# Patient Record
Sex: Female | Born: 1975 | Race: White | Hispanic: No | Marital: Married | Smoking: Former smoker
Health system: Southern US, Community
[De-identification: ages and names within clinical notes are randomized; demographics above are authoritative.]

## PROBLEM LIST (undated history)

## (undated) DIAGNOSIS — E039 Hypothyroidism, unspecified: Secondary | ICD-10-CM

## (undated) DIAGNOSIS — E079 Disorder of thyroid, unspecified: Secondary | ICD-10-CM

## (undated) DIAGNOSIS — Z8742 Personal history of other diseases of the female genital tract: Secondary | ICD-10-CM

## (undated) DIAGNOSIS — Q513 Bicornate uterus: Secondary | ICD-10-CM

## (undated) DIAGNOSIS — M1990 Unspecified osteoarthritis, unspecified site: Secondary | ICD-10-CM

## (undated) DIAGNOSIS — I011 Acute rheumatic endocarditis: Secondary | ICD-10-CM

## (undated) HISTORY — PX: MYRINGOTOMY WITH TUBE PLACEMENT: SHX5663

## (undated) HISTORY — DX: Personal history of other diseases of the female genital tract: Z87.42

## (undated) HISTORY — DX: Acute rheumatic endocarditis: I01.1

## (undated) HISTORY — DX: Disorder of thyroid, unspecified: E07.9

## (undated) HISTORY — DX: Bicornate uterus: Q51.3

---
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
## 2005-06-02 DIAGNOSIS — E039 Hypothyroidism, unspecified: Secondary | ICD-10-CM | POA: Insufficient documentation

## 2008-08-23 DIAGNOSIS — IMO0002 Reserved for concepts with insufficient information to code with codable children: Secondary | ICD-10-CM | POA: Insufficient documentation

## 2012-12-23 HISTORY — PX: DILATION AND CURETTAGE, DIAGNOSTIC / THERAPEUTIC: SUR384

## 2012-12-23 HISTORY — PX: TUBAL LIGATION: SHX77

## 2018-03-24 DIAGNOSIS — E039 Hypothyroidism, unspecified: Secondary | ICD-10-CM | POA: Diagnosis not present

## 2019-01-11 DIAGNOSIS — R202 Paresthesia of skin: Secondary | ICD-10-CM | POA: Diagnosis not present

## 2019-01-11 DIAGNOSIS — R635 Abnormal weight gain: Secondary | ICD-10-CM | POA: Diagnosis not present

## 2019-01-11 DIAGNOSIS — Z1329 Encounter for screening for other suspected endocrine disorder: Secondary | ICD-10-CM | POA: Diagnosis not present

## 2019-01-11 DIAGNOSIS — E039 Hypothyroidism, unspecified: Secondary | ICD-10-CM | POA: Diagnosis not present

## 2019-01-14 DIAGNOSIS — E781 Pure hyperglyceridemia: Secondary | ICD-10-CM | POA: Insufficient documentation

## 2019-03-28 DIAGNOSIS — E039 Hypothyroidism, unspecified: Secondary | ICD-10-CM | POA: Diagnosis not present

## 2019-03-28 DIAGNOSIS — E781 Pure hyperglyceridemia: Secondary | ICD-10-CM | POA: Diagnosis not present

## 2019-11-23 DIAGNOSIS — N926 Irregular menstruation, unspecified: Secondary | ICD-10-CM | POA: Diagnosis not present

## 2019-11-23 DIAGNOSIS — Z130 Encounter for screening for diseases of the blood and blood-forming organs and certain disorders involving the immune mechanism: Secondary | ICD-10-CM | POA: Diagnosis not present

## 2019-11-23 DIAGNOSIS — R2232 Localized swelling, mass and lump, left upper limb: Secondary | ICD-10-CM | POA: Diagnosis not present

## 2019-11-23 DIAGNOSIS — Z13220 Encounter for screening for lipoid disorders: Secondary | ICD-10-CM | POA: Diagnosis not present

## 2019-11-23 DIAGNOSIS — Z0000 Encounter for general adult medical examination without abnormal findings: Secondary | ICD-10-CM | POA: Diagnosis not present

## 2019-11-23 DIAGNOSIS — Z131 Encounter for screening for diabetes mellitus: Secondary | ICD-10-CM | POA: Diagnosis not present

## 2019-11-23 DIAGNOSIS — Z6836 Body mass index (BMI) 36.0-36.9, adult: Secondary | ICD-10-CM | POA: Diagnosis not present

## 2019-11-23 DIAGNOSIS — N920 Excessive and frequent menstruation with regular cycle: Secondary | ICD-10-CM | POA: Diagnosis not present

## 2019-11-23 DIAGNOSIS — Z01419 Encounter for gynecological examination (general) (routine) without abnormal findings: Secondary | ICD-10-CM | POA: Diagnosis not present

## 2019-11-23 DIAGNOSIS — Z3200 Encounter for pregnancy test, result unknown: Secondary | ICD-10-CM | POA: Diagnosis not present

## 2019-11-23 DIAGNOSIS — Z1151 Encounter for screening for human papillomavirus (HPV): Secondary | ICD-10-CM | POA: Diagnosis not present

## 2019-11-23 DIAGNOSIS — E039 Hypothyroidism, unspecified: Secondary | ICD-10-CM | POA: Diagnosis not present

## 2019-11-30 ENCOUNTER — Other Ambulatory Visit: Payer: Self-pay | Admitting: Obstetrics

## 2019-11-30 DIAGNOSIS — R2230 Localized swelling, mass and lump, unspecified upper limb: Secondary | ICD-10-CM

## 2019-12-04 DIAGNOSIS — R2242 Localized swelling, mass and lump, left lower limb: Secondary | ICD-10-CM | POA: Diagnosis not present

## 2019-12-04 DIAGNOSIS — Z6833 Body mass index (BMI) 33.0-33.9, adult: Secondary | ICD-10-CM | POA: Diagnosis not present

## 2019-12-18 ENCOUNTER — Ambulatory Visit
Admission: RE | Admit: 2019-12-18 | Discharge: 2019-12-18 | Disposition: A | Discharge status: Home / Self Care | Payer: BC Managed Care – PPO | Source: Ambulatory Visit | Attending: Obstetrics | Admitting: Obstetrics

## 2019-12-18 ENCOUNTER — Other Ambulatory Visit: Payer: Self-pay

## 2019-12-18 DIAGNOSIS — R928 Other abnormal and inconclusive findings on diagnostic imaging of breast: Secondary | ICD-10-CM | POA: Diagnosis not present

## 2019-12-18 DIAGNOSIS — N6001 Solitary cyst of right breast: Secondary | ICD-10-CM | POA: Diagnosis not present

## 2019-12-18 DIAGNOSIS — R2230 Localized swelling, mass and lump, unspecified upper limb: Secondary | ICD-10-CM

## 2019-12-18 IMAGING — MG DIGITAL DIAGNOSTIC BILAT W/ TOMO W/ CAD
6 of 10 series · 6 of 30 positions shown · non-contrast
Comparison: None.

ACR Breast Density Category a: The breast tissue is almost entirely
fatty.

CLINICAL DATA: Baseline mammogram. Palpable lump in the left
axilla.

EXAM:
DIGITAL DIAGNOSTIC BILATERAL MAMMOGRAM WITH CAD AND TOMO
ULTRASOUND LEFT BREAST

[R CC synth-2D]
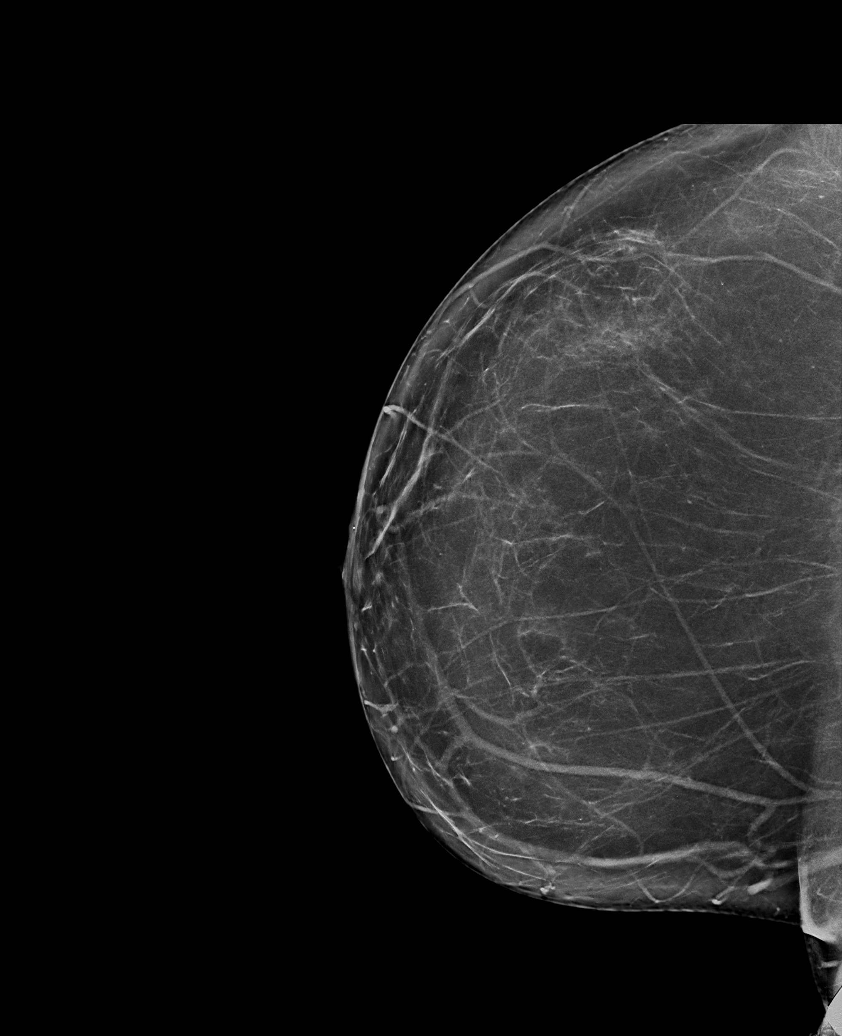

[L CC synth-2D]
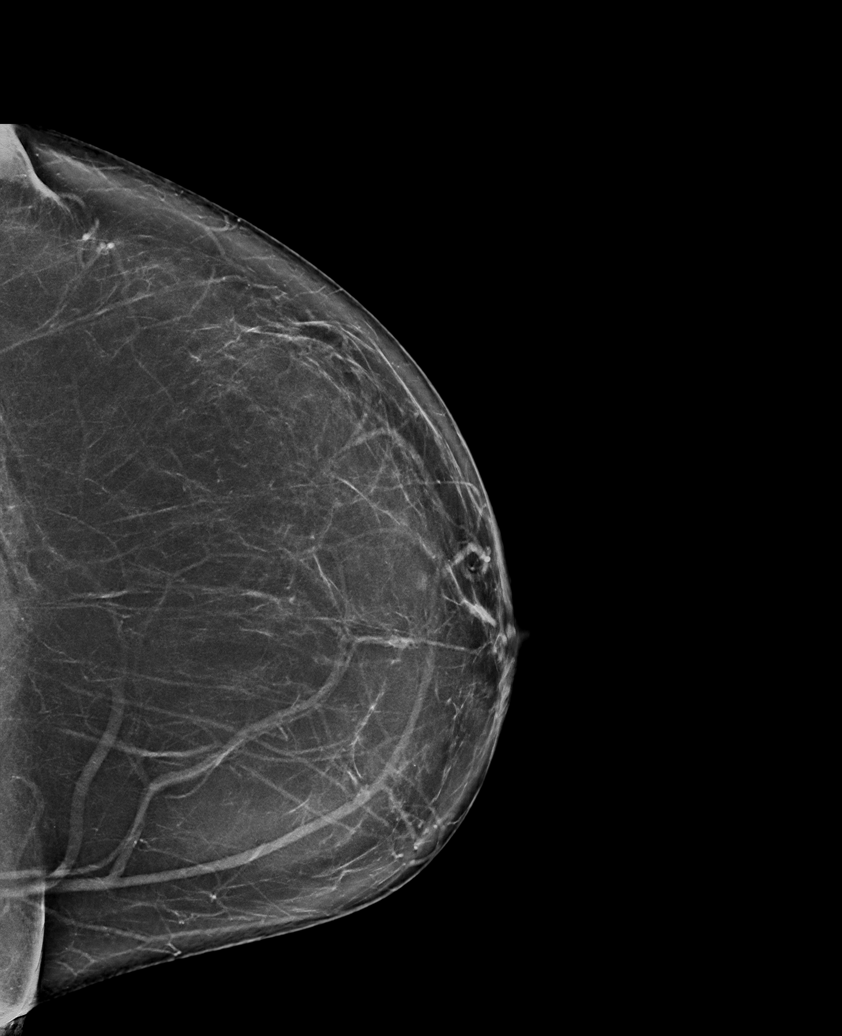

[R MLO synth-2D]
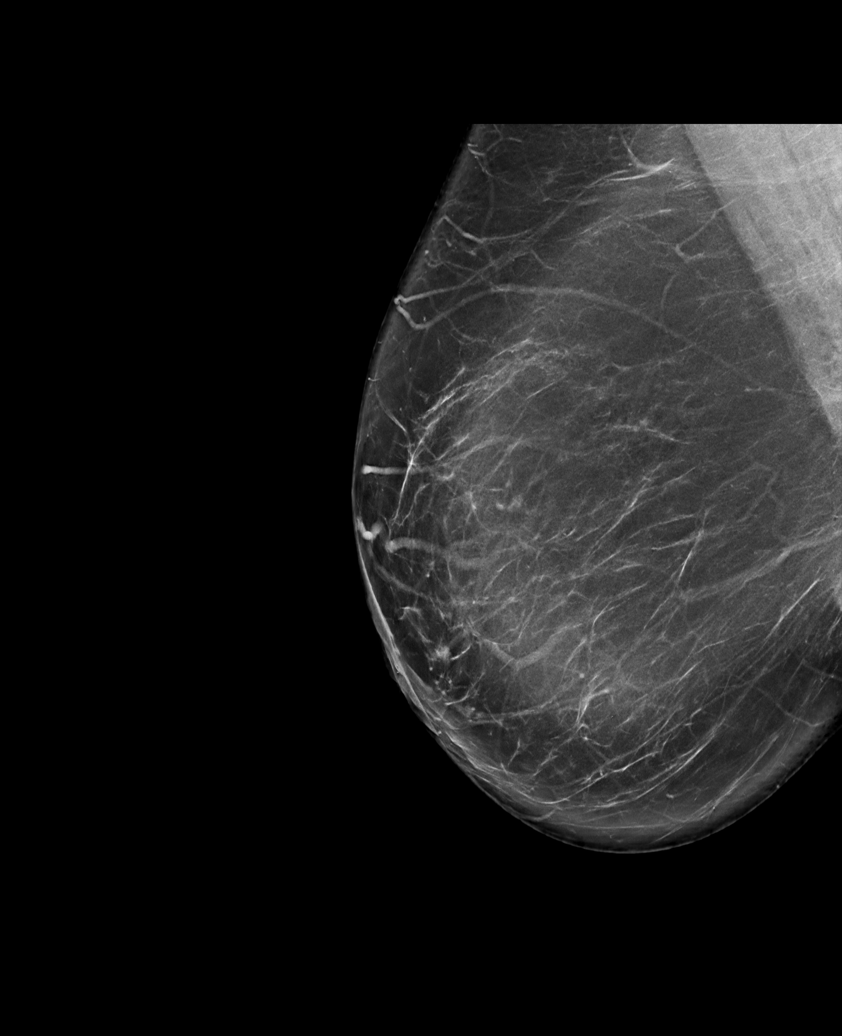

[L MLO synth-2D (1 of 2)]
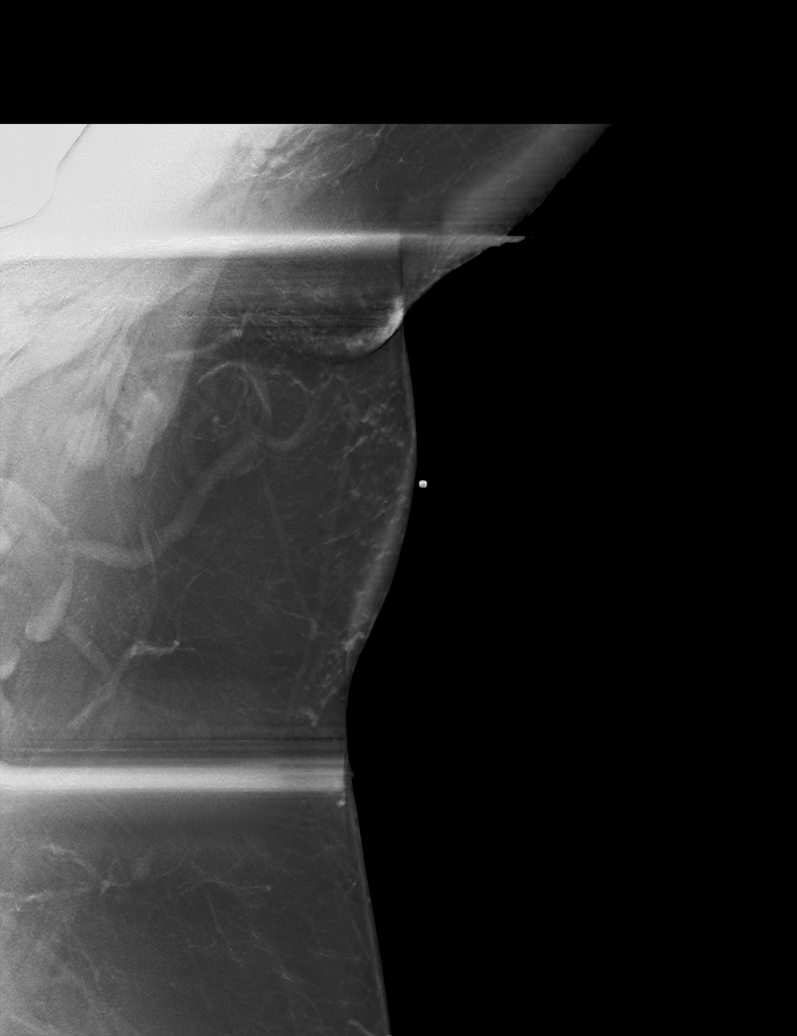

[L MLO synth-2D (2 of 2)]
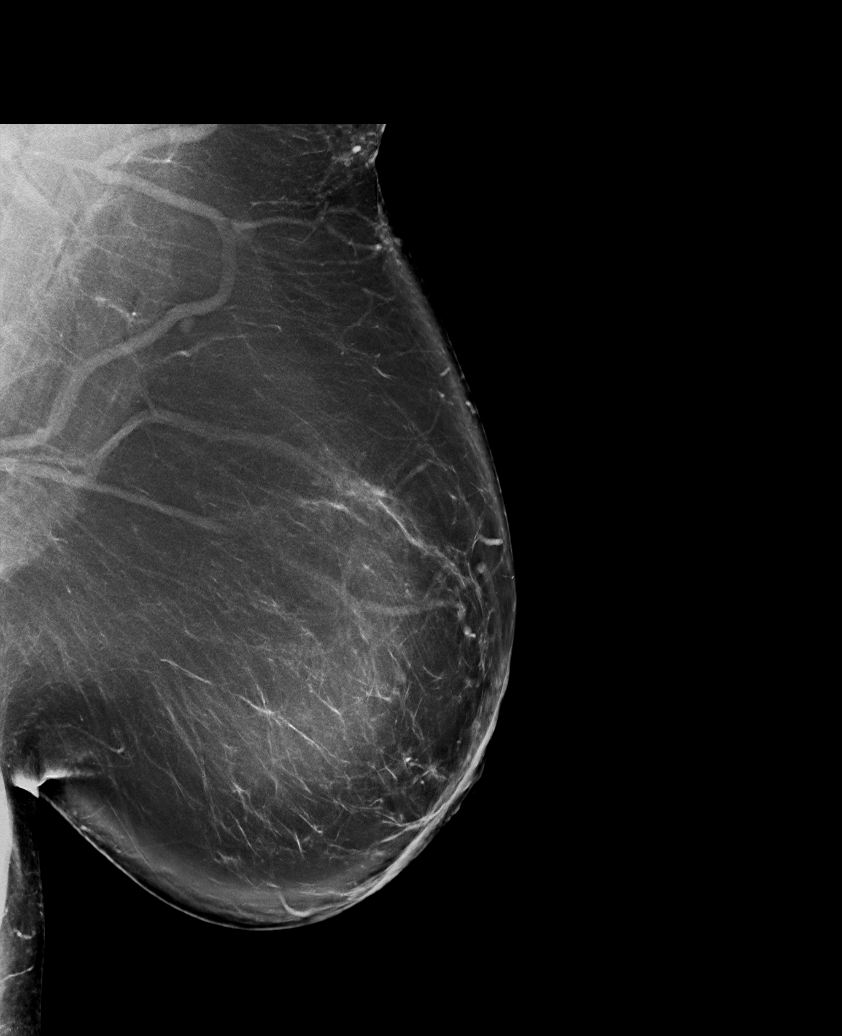

[R MLO tomo · tomo slice 50/99.0]
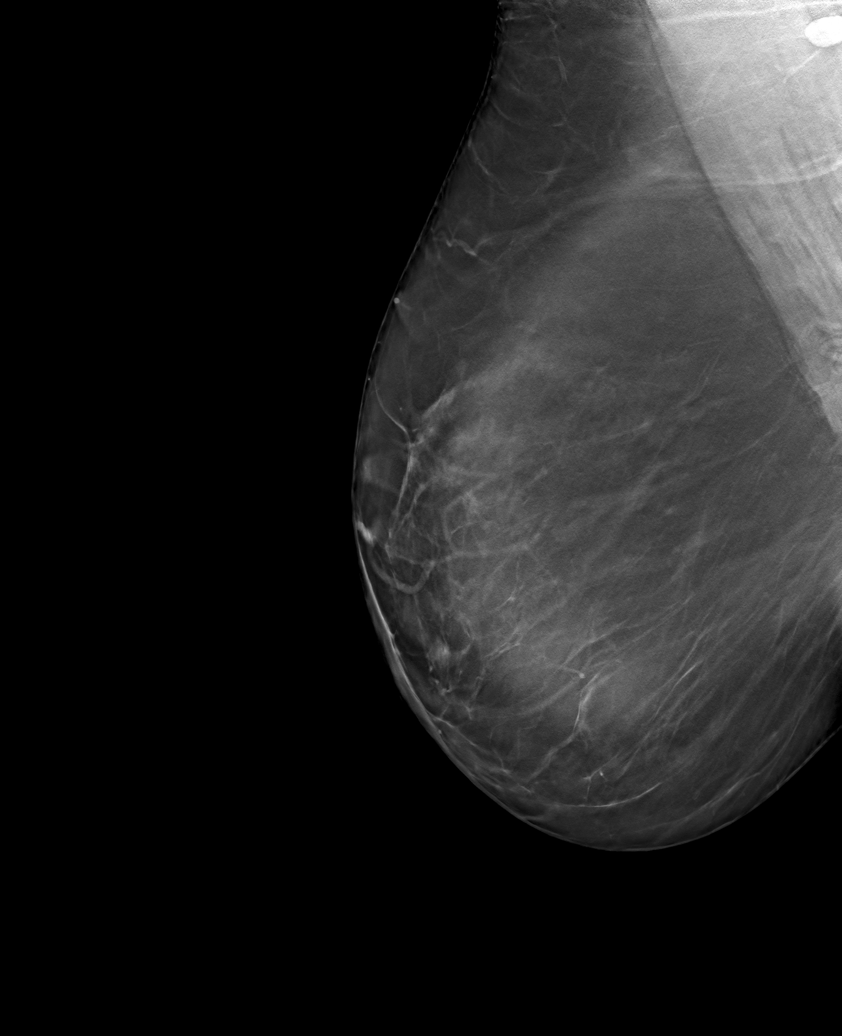

[6 of 30 positions shown; findings below may reference images not displayed]

FINDINGS: There is a small mass in the anterior left breast measuring 3 or 4
mm. No other suspicious mammographic findings are identified. Normal
tissue including mostly fat but also a small amount of glandular
tissue is seen in the region of the patient's lump in the left
axilla.

Mammographic images were processed with CAD.

Targeted ultrasound is performed, showing no abnormalities in the
region of the patient's left axillary lump. A small cyst is seen in
the left breast at 1 o'clock, 2 cm from the nipple measuring up to
3.9 mm, correlating with the mammographic finding.
IMPRESSION: No mammographic or sonographic evidence of malignancy.

RECOMMENDATION:
Annual screening mammography.

I have discussed the findings and recommendations with the patient.
If applicable, a reminder letter will be sent to the patient
regarding the next appointment.

BI-RADS CATEGORY  2: Benign.

## 2019-12-18 IMAGING — US US BREAST*L* LIMITED INC AXILLA
1 series · 9 of 9 positions shown · non-contrast
Comparison: None.

ACR Breast Density Category a: The breast tissue is almost entirely
fatty.

CLINICAL DATA: Baseline mammogram. Palpable lump in the left
axilla.

EXAM:
DIGITAL DIAGNOSTIC BILATERAL MAMMOGRAM WITH CAD AND TOMO
ULTRASOUND LEFT BREAST

[Series 1: us breast*left* limited inc axilla · 0.07mm/px · 9 of 9 slices shown]
[im 1/9]
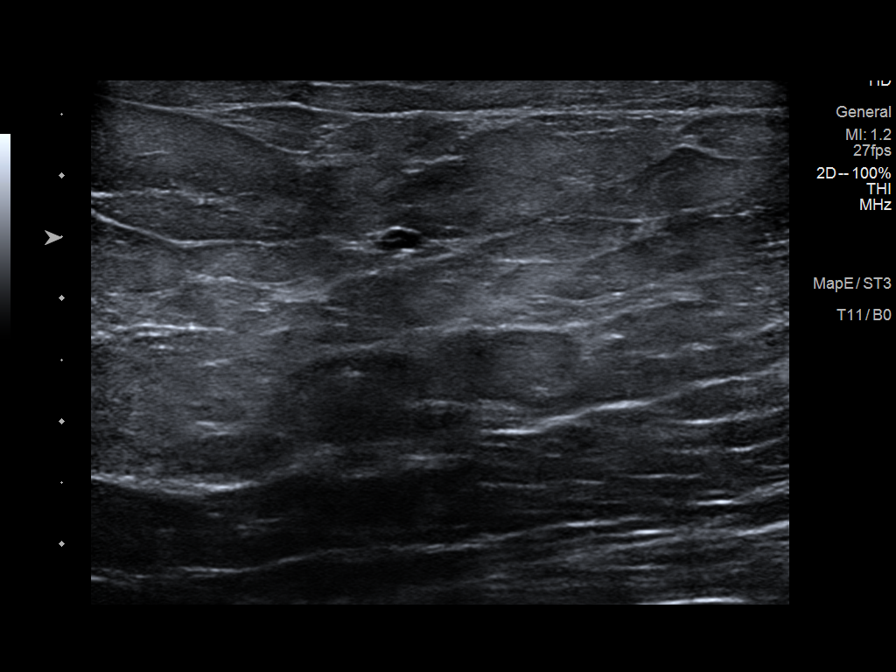
[im 2/9]
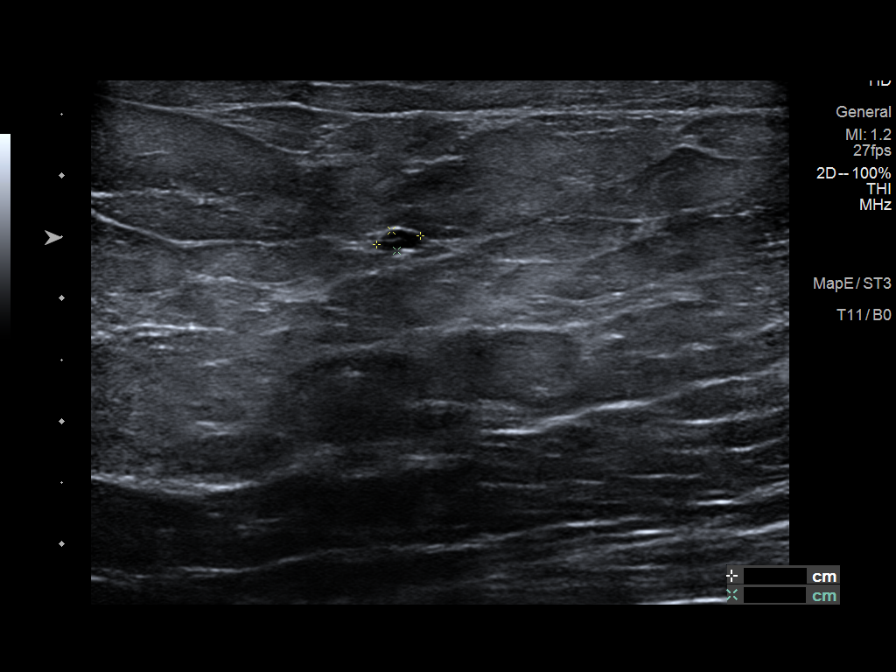
[im 3/9]
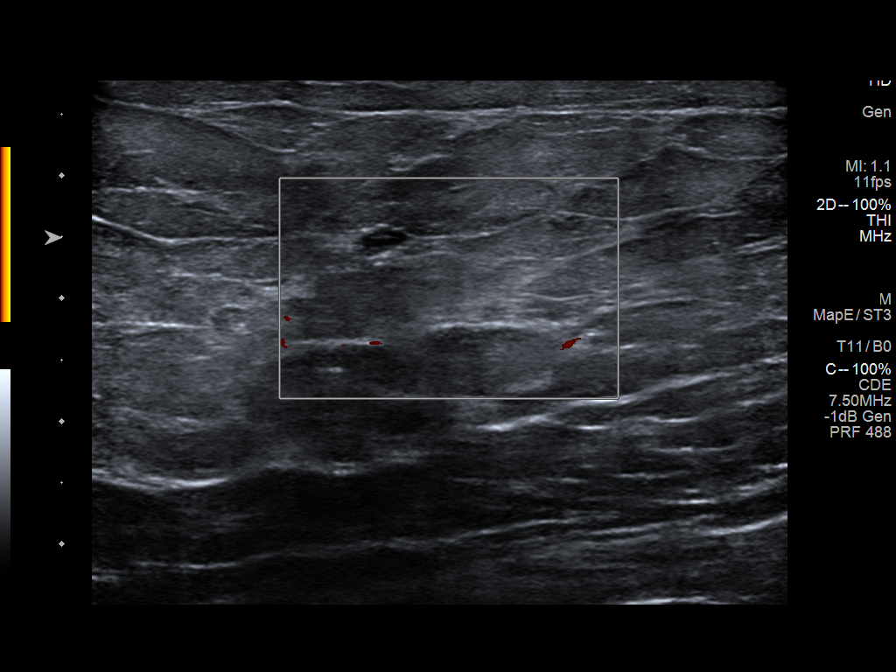
[im 4/9]
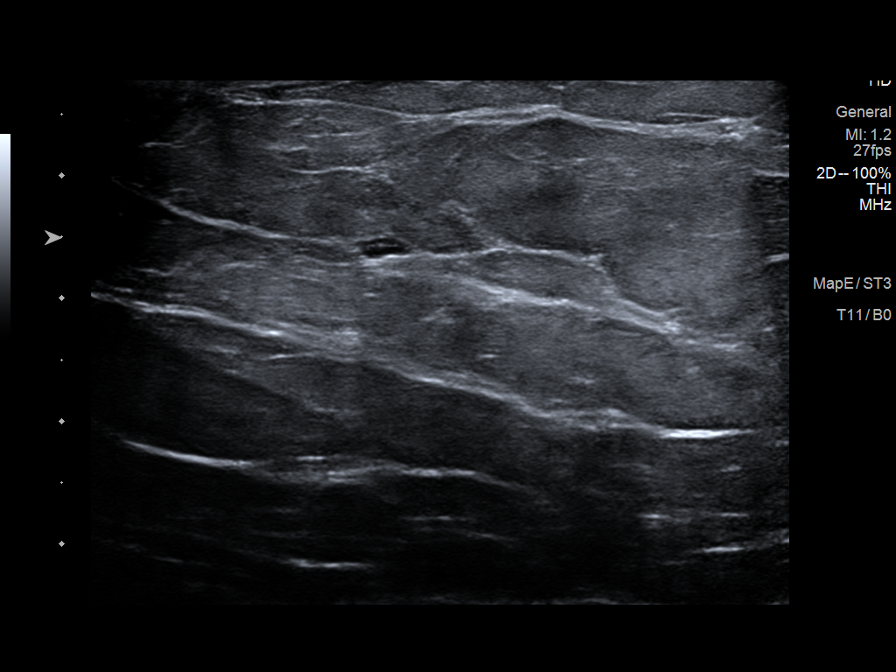
[im 5/9]
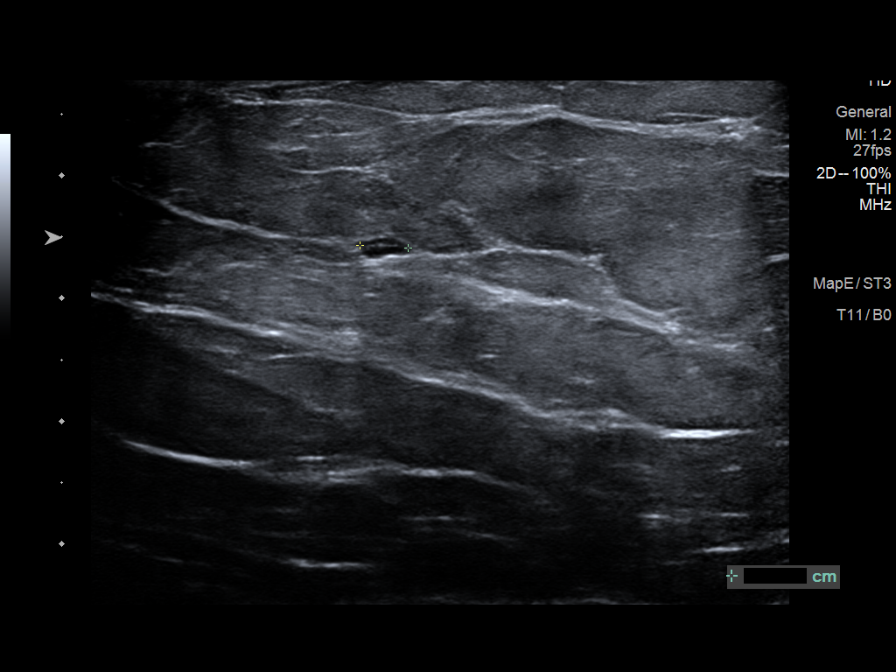
[im 6/9]
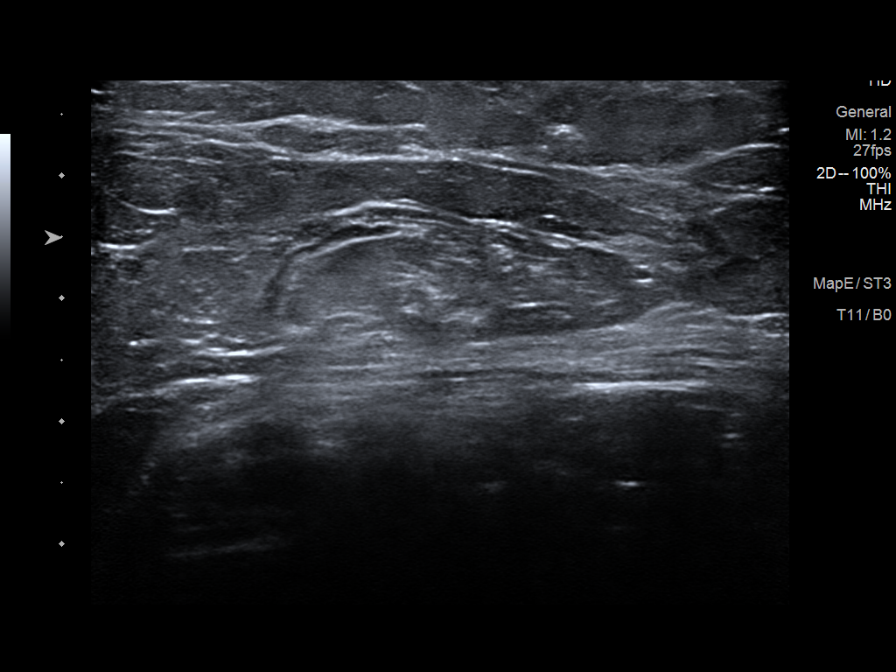
[im 7/9]
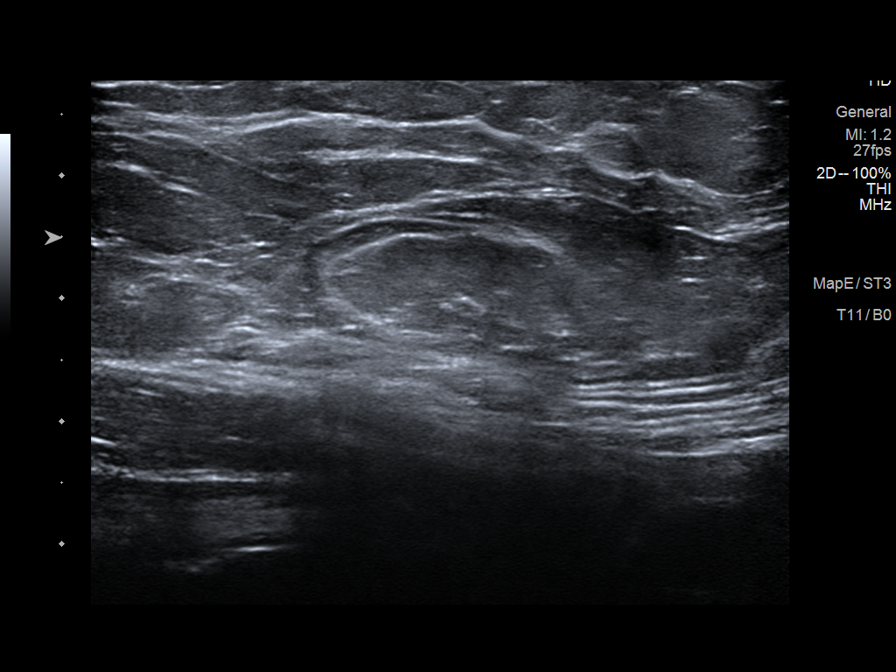
[im 8/9]
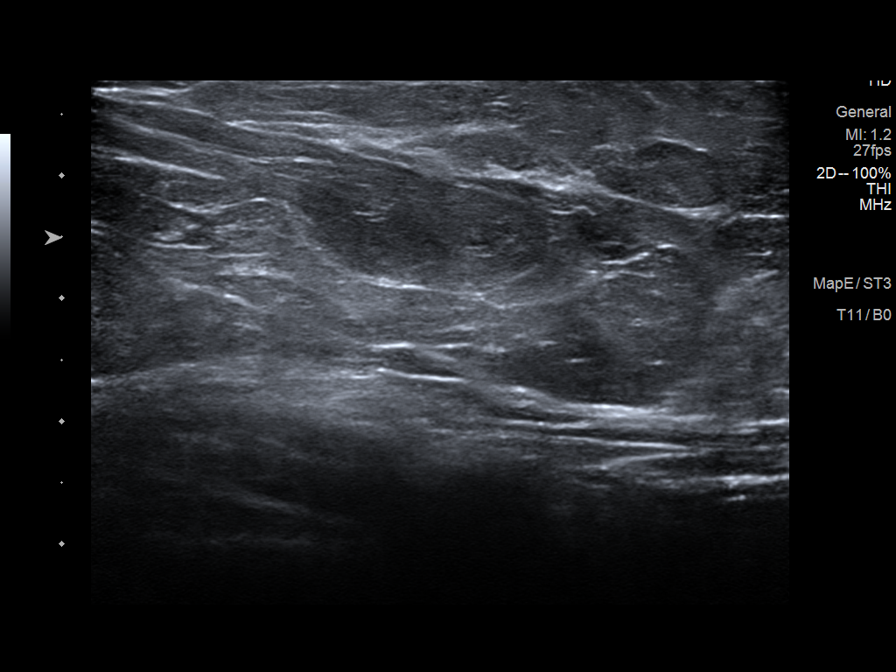
[im 9/9]
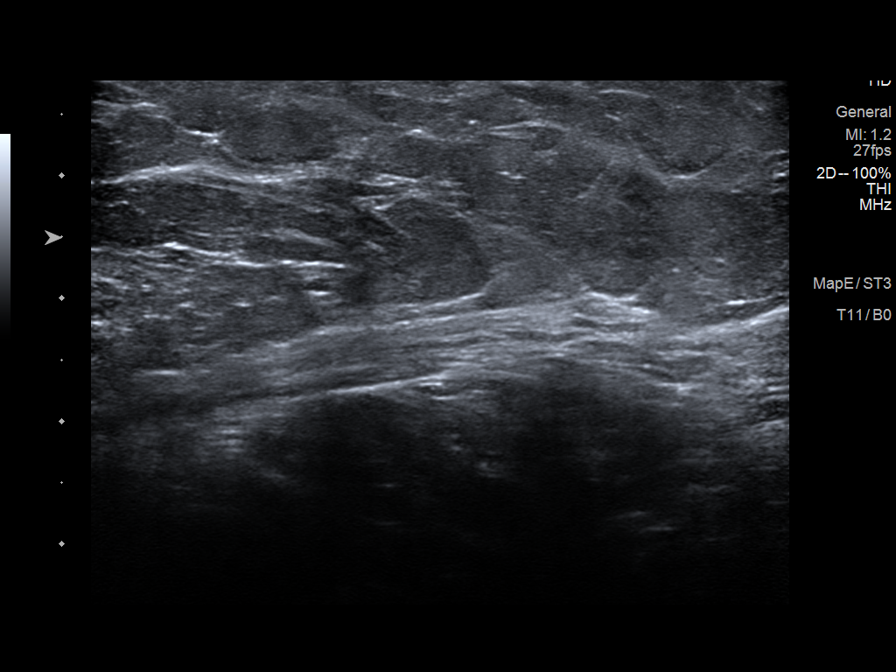

[9 of 9 positions shown; findings below may reference images not displayed]

FINDINGS: There is a small mass in the anterior left breast measuring 3 or 4
mm. No other suspicious mammographic findings are identified. Normal
tissue including mostly fat but also a small amount of glandular
tissue is seen in the region of the patient's lump in the left
axilla.

Mammographic images were processed with CAD.

Targeted ultrasound is performed, showing no abnormalities in the
region of the patient's left axillary lump. A small cyst is seen in
the left breast at 1 o'clock, 2 cm from the nipple measuring up to
3.9 mm, correlating with the mammographic finding.
IMPRESSION: No mammographic or sonographic evidence of malignancy.

RECOMMENDATION:
Annual screening mammography.

I have discussed the findings and recommendations with the patient.
If applicable, a reminder letter will be sent to the patient
regarding the next appointment.

BI-RADS CATEGORY  2: Benign.

## 2019-12-26 DIAGNOSIS — N926 Irregular menstruation, unspecified: Secondary | ICD-10-CM | POA: Diagnosis not present

## 2019-12-26 DIAGNOSIS — N8300 Follicular cyst of ovary, unspecified side: Secondary | ICD-10-CM | POA: Diagnosis not present

## 2020-05-13 DIAGNOSIS — R5383 Other fatigue: Secondary | ICD-10-CM | POA: Diagnosis not present

## 2020-05-13 DIAGNOSIS — E559 Vitamin D deficiency, unspecified: Secondary | ICD-10-CM | POA: Diagnosis not present

## 2020-05-13 DIAGNOSIS — E039 Hypothyroidism, unspecified: Secondary | ICD-10-CM | POA: Diagnosis not present

## 2020-05-13 DIAGNOSIS — R5381 Other malaise: Secondary | ICD-10-CM | POA: Diagnosis not present

## 2020-05-13 DIAGNOSIS — R197 Diarrhea, unspecified: Secondary | ICD-10-CM | POA: Diagnosis not present

## 2020-05-13 DIAGNOSIS — M130 Polyarthritis, unspecified: Secondary | ICD-10-CM | POA: Diagnosis not present

## 2020-05-20 DIAGNOSIS — E559 Vitamin D deficiency, unspecified: Secondary | ICD-10-CM | POA: Diagnosis not present

## 2020-05-20 DIAGNOSIS — D649 Anemia, unspecified: Secondary | ICD-10-CM | POA: Diagnosis not present

## 2020-05-20 DIAGNOSIS — R768 Other specified abnormal immunological findings in serum: Secondary | ICD-10-CM | POA: Diagnosis not present

## 2020-05-20 DIAGNOSIS — E039 Hypothyroidism, unspecified: Secondary | ICD-10-CM | POA: Diagnosis not present

## 2020-05-22 DIAGNOSIS — M545 Low back pain, unspecified: Secondary | ICD-10-CM | POA: Diagnosis not present

## 2020-05-22 DIAGNOSIS — M549 Dorsalgia, unspecified: Secondary | ICD-10-CM | POA: Diagnosis not present

## 2020-05-22 DIAGNOSIS — M533 Sacrococcygeal disorders, not elsewhere classified: Secondary | ICD-10-CM | POA: Diagnosis not present

## 2020-05-22 DIAGNOSIS — M1990 Unspecified osteoarthritis, unspecified site: Secondary | ICD-10-CM | POA: Diagnosis not present

## 2020-05-22 DIAGNOSIS — R700 Elevated erythrocyte sedimentation rate: Secondary | ICD-10-CM | POA: Diagnosis not present

## 2020-05-22 DIAGNOSIS — J9811 Atelectasis: Secondary | ICD-10-CM | POA: Diagnosis not present

## 2020-05-22 DIAGNOSIS — M79671 Pain in right foot: Secondary | ICD-10-CM | POA: Diagnosis not present

## 2020-05-22 DIAGNOSIS — M79641 Pain in right hand: Secondary | ICD-10-CM | POA: Diagnosis not present

## 2020-05-22 DIAGNOSIS — M79642 Pain in left hand: Secondary | ICD-10-CM | POA: Diagnosis not present

## 2020-05-22 DIAGNOSIS — M79672 Pain in left foot: Secondary | ICD-10-CM | POA: Diagnosis not present

## 2020-05-22 DIAGNOSIS — R21 Rash and other nonspecific skin eruption: Secondary | ICD-10-CM | POA: Diagnosis not present

## 2020-05-22 DIAGNOSIS — M47814 Spondylosis without myelopathy or radiculopathy, thoracic region: Secondary | ICD-10-CM | POA: Diagnosis not present

## 2020-05-22 DIAGNOSIS — R911 Solitary pulmonary nodule: Secondary | ICD-10-CM | POA: Diagnosis not present

## 2020-05-22 DIAGNOSIS — M2550 Pain in unspecified joint: Secondary | ICD-10-CM | POA: Diagnosis not present

## 2020-05-22 DIAGNOSIS — M47812 Spondylosis without myelopathy or radiculopathy, cervical region: Secondary | ICD-10-CM | POA: Diagnosis not present

## 2020-06-20 DIAGNOSIS — M0609 Rheumatoid arthritis without rheumatoid factor, multiple sites: Secondary | ICD-10-CM | POA: Diagnosis not present

## 2020-06-20 DIAGNOSIS — Z79899 Other long term (current) drug therapy: Secondary | ICD-10-CM | POA: Diagnosis not present

## 2020-06-20 DIAGNOSIS — M2550 Pain in unspecified joint: Secondary | ICD-10-CM | POA: Diagnosis not present

## 2020-06-20 DIAGNOSIS — M1990 Unspecified osteoarthritis, unspecified site: Secondary | ICD-10-CM | POA: Diagnosis not present

## 2020-07-11 DIAGNOSIS — L7211 Pilar cyst: Secondary | ICD-10-CM | POA: Diagnosis not present

## 2020-07-22 DIAGNOSIS — Z79899 Other long term (current) drug therapy: Secondary | ICD-10-CM | POA: Diagnosis not present

## 2020-07-22 DIAGNOSIS — M2550 Pain in unspecified joint: Secondary | ICD-10-CM | POA: Diagnosis not present

## 2020-07-22 DIAGNOSIS — M0609 Rheumatoid arthritis without rheumatoid factor, multiple sites: Secondary | ICD-10-CM | POA: Diagnosis not present

## 2020-07-22 DIAGNOSIS — M1990 Unspecified osteoarthritis, unspecified site: Secondary | ICD-10-CM | POA: Diagnosis not present

## 2020-09-17 ENCOUNTER — Encounter: Payer: Self-pay | Admitting: Plastic Surgery

## 2020-09-17 ENCOUNTER — Ambulatory Visit (INDEPENDENT_AMBULATORY_CARE_PROVIDER_SITE_OTHER): Payer: BLUE CROSS/BLUE SHIELD | Admitting: Plastic Surgery

## 2020-09-17 ENCOUNTER — Other Ambulatory Visit: Payer: Self-pay

## 2020-09-17 VITALS — BP 121/83 | HR 107 | Ht 63.0 in | Wt 188.2 lb

## 2020-09-17 DIAGNOSIS — L723 Sebaceous cyst: Secondary | ICD-10-CM

## 2020-09-17 NOTE — Progress Notes (Signed)
   Referring Provider Everardo Beals, NP [address] [place],  [place] [address]   CC:  Chief Complaint  Patient presents with   Advice Only      Brittney Herrera is an 45 y.o. female.  HPI: Patient presents to discuss a cyst on her scalp.  Is been present for 5 years.  Over the past 6 months it is grown significantly.  She also notes another one just adjacent to it.  She has had cyst in the past that have drained elsewhere on her body.  She is interested in removal of the 2 prominent scalp cysts that she has currently.  No Known Allergies  Outpatient Encounter Medications as of 09/17/2020  Medication Sig   folic acid (FOLVITE) 1 MG tablet Take 1 mg by mouth daily.   leflunomide (ARAVA) 20 MG tablet Take 20 mg by mouth daily.   levothyroxine (SYNTHROID) 125 MCG tablet Take 125 mcg by mouth daily.   No facility-administered encounter medications on file as of 09/17/2020.     No past medical history on file.  No family history on file.  Social History   Social History Narrative   Not on file     Review of Systems General: Denies fevers, chills, weight loss CV: Denies chest pain, shortness of breath, palpitations  Physical Exam Vitals with BMI 09/17/2020  Height '5\' 3"'$   Weight 188 lbs 3 oz  BMI 123XX123  Systolic 123XX123  Diastolic 83  Pulse [id number]    General:  No acute distress,  Alert and oriented, Non-Toxic, Normal speech and affect Examination of the scalp shows 2 adjacent cystic lesions just adjacent to each other.  One is probably around 3 cm in size and the adjacent wound is closer to 2 cm.  The hair is thinning over this area but there is no other obvious skin changes.  Assessment/Plan Patient presents with what I expect her to scalp cyst.  They are changing in size and shape and I believe warrant excision.  We discussed the risks include bleeding, infection, damage to surrounding structures need for additional procedures.  All her questions were answered we  will plan to schedule this under local in the office.  Cindra Presume 09/17/2020, 10:08 AM

## 2020-10-23 DIAGNOSIS — M1990 Unspecified osteoarthritis, unspecified site: Secondary | ICD-10-CM | POA: Diagnosis not present

## 2020-10-23 DIAGNOSIS — M0609 Rheumatoid arthritis without rheumatoid factor, multiple sites: Secondary | ICD-10-CM | POA: Diagnosis not present

## 2020-10-23 DIAGNOSIS — Z79899 Other long term (current) drug therapy: Secondary | ICD-10-CM | POA: Diagnosis not present

## 2020-10-23 DIAGNOSIS — M2550 Pain in unspecified joint: Secondary | ICD-10-CM | POA: Diagnosis not present

## 2020-11-18 DIAGNOSIS — K649 Unspecified hemorrhoids: Secondary | ICD-10-CM

## 2020-11-18 DIAGNOSIS — E781 Pure hyperglyceridemia: Secondary | ICD-10-CM

## 2020-11-18 DIAGNOSIS — M0609 Rheumatoid arthritis without rheumatoid factor, multiple sites: Secondary | ICD-10-CM | POA: Diagnosis not present

## 2020-11-18 HISTORY — DX: Unspecified hemorrhoids: K64.9

## 2020-11-18 HISTORY — DX: Pure hyperglyceridemia: E78.1

## 2020-11-26 DIAGNOSIS — Z01419 Encounter for gynecological examination (general) (routine) without abnormal findings: Secondary | ICD-10-CM | POA: Diagnosis not present

## 2020-11-26 DIAGNOSIS — N926 Irregular menstruation, unspecified: Secondary | ICD-10-CM | POA: Diagnosis not present

## 2020-11-26 DIAGNOSIS — Z6833 Body mass index (BMI) 33.0-33.9, adult: Secondary | ICD-10-CM | POA: Diagnosis not present

## 2020-11-26 DIAGNOSIS — Z1231 Encounter for screening mammogram for malignant neoplasm of breast: Secondary | ICD-10-CM | POA: Diagnosis not present

## 2020-11-26 DIAGNOSIS — R300 Dysuria: Secondary | ICD-10-CM | POA: Diagnosis not present

## 2020-11-27 DIAGNOSIS — N83292 Other ovarian cyst, left side: Secondary | ICD-10-CM | POA: Diagnosis not present

## 2020-11-27 DIAGNOSIS — N921 Excessive and frequent menstruation with irregular cycle: Secondary | ICD-10-CM | POA: Diagnosis not present

## 2020-11-28 DIAGNOSIS — K429 Umbilical hernia without obstruction or gangrene: Secondary | ICD-10-CM

## 2020-11-28 HISTORY — DX: Umbilical hernia without obstruction or gangrene: K42.9

## 2020-12-02 ENCOUNTER — Telehealth: Payer: Self-pay

## 2020-12-02 MED ORDER — DIAZEPAM 5 MG PO TABS: 5.0000 mg | ORAL_TABLET | Freq: Two times a day (BID) | ORAL | 0 refills | Status: AC | PRN

## 2020-12-02 NOTE — Telephone Encounter (Signed)
Pt is aware.  

## 2020-12-02 NOTE — Telephone Encounter (Signed)
Please advise. Thanks.  

## 2020-12-02 NOTE — Telephone Encounter (Signed)
Patient called to say she is having anxiety about her procedure tomorrow.  Patient said she believes it is stemming from a dental appointment she had in the past.  She said that she was so anxious about the procedure, that the numbing medication didn't work on her.  Patient would like to know if we can give her something to relax her for the procedure.  Patient said her husband is planning to bring her and take her home so she won't have to drive.  Please call.  *Patient's preferred pharmacy is [facility] on [address] in [place].

## 2020-12-03 ENCOUNTER — Encounter: Payer: Self-pay | Admitting: Plastic Surgery

## 2020-12-03 ENCOUNTER — Other Ambulatory Visit (HOSPITAL_COMMUNITY)
Admission: RE | Admit: 2020-12-03 | Discharge: 2020-12-03 | Disposition: A | Discharge status: Home / Self Care | Payer: BLUE CROSS/BLUE SHIELD | Source: Ambulatory Visit | Attending: Plastic Surgery | Admitting: Plastic Surgery

## 2020-12-03 ENCOUNTER — Other Ambulatory Visit: Payer: Self-pay

## 2020-12-03 ENCOUNTER — Ambulatory Visit (INDEPENDENT_AMBULATORY_CARE_PROVIDER_SITE_OTHER): Payer: BLUE CROSS/BLUE SHIELD | Admitting: Plastic Surgery

## 2020-12-03 VITALS — BP 118/80 | HR 85 | Ht 63.0 in | Wt 192.2 lb

## 2020-12-03 DIAGNOSIS — L723 Sebaceous cyst: Secondary | ICD-10-CM | POA: Diagnosis not present

## 2020-12-03 DIAGNOSIS — L720 Epidermal cyst: Secondary | ICD-10-CM | POA: Diagnosis not present

## 2020-12-03 DIAGNOSIS — L089 Local infection of the skin and subcutaneous tissue, unspecified: Secondary | ICD-10-CM | POA: Diagnosis not present

## 2020-12-03 NOTE — Progress Notes (Signed)
Operative Note   DATE OF OPERATION: 12/03/2020  LOCATION:    SURGICAL DEPARTMENT: Plastic Surgery  PREOPERATIVE DIAGNOSES: Scalp cyst  POSTOPERATIVE DIAGNOSES:  same  PROCEDURE:  Excision of scalp cyst measuring 4 cm Complex closure measuring 4 cm  SURGEON: Talmadge Coventry, MD  ANESTHESIA:  Local  COMPLICATIONS: None.   INDICATIONS FOR PROCEDURE:  The patient, Brittney Herrera is a 45 y.o. female born on [birth year], is here for treatment of scalp cyst MRN: [id number]  CONSENT:  Informed consent was obtained directly from the patient. Risks, benefits and alternatives were fully discussed. Specific risks including but not limited to bleeding, infection, hematoma, seroma, scarring, pain, infection, wound healing problems, and need for further surgery were all discussed. The patient did have an ample opportunity to have questions answered to satisfaction.   DESCRIPTION OF PROCEDURE:  Local anesthesia was administered. The patient's operative site was prepped and draped in a sterile fashion. A time out was performed and all information was confirmed to be correct.  The lesion was excised with a 15 blade.  Hemostasis was obtained.  Circumferential undermining was performed and the skin was advanced and closed in layers with interrupted buried Monocryl sutures and 3-0 Monocryl for the skin.  The lesion excised measured 4 cm, and the total length of closure measured 4 cm.    The patient tolerated the procedure well.  There were no complications.

## 2020-12-04 LAB — SURGICAL PATHOLOGY

## 2020-12-05 ENCOUNTER — Telehealth: Payer: Self-pay | Admitting: *Deleted

## 2020-12-05 NOTE — Telephone Encounter (Signed)
Attempted to reach the patient to schedule a new patient appt with Dr Berline Lopes. Patient's voice mail full and unable to leave message

## 2020-12-08 NOTE — Telephone Encounter (Signed)
Spoke with the patient and scheduled a new patient appt for 11/7 at 9:45 am with Dr Berline Lopes. Patient given the address and phone number fore the clinic; along with the policy for mask and visitors

## 2020-12-15 HISTORY — PX: OTHER SURGICAL HISTORY: SHX169

## 2020-12-17 ENCOUNTER — Other Ambulatory Visit: Payer: Self-pay

## 2020-12-17 ENCOUNTER — Ambulatory Visit (INDEPENDENT_AMBULATORY_CARE_PROVIDER_SITE_OTHER): Payer: PRIVATE HEALTH INSURANCE | Admitting: Surgical

## 2020-12-17 DIAGNOSIS — L723 Sebaceous cyst: Secondary | ICD-10-CM

## 2020-12-17 NOTE — Progress Notes (Signed)
Patient is a 45 year old female here for follow-up after excision of scalp cyst with Dr. Claudia Desanctis on 12/03/2020.  She is doing well.  She is not having any issues.  She reports that she has some itching of the incision.  On exam the incision is intact and healing well.  There is no erythema or cellulitic changes.  Monocryl running suture is noted.  Suture was removed, patient tolerated this well.  We discussed the pathology report which was benign.  Recommend following up as needed.  Picture was taken and placed in the patient's chart with patient's permission.  Call with questions or concerns.

## 2020-12-26 ENCOUNTER — Encounter: Payer: Self-pay | Admitting: Gynecologic Oncology

## 2020-12-29 ENCOUNTER — Inpatient Hospital Stay: Payer: BC Managed Care – PPO | Attending: Gynecologic Oncology | Admitting: Gynecologic Oncology

## 2020-12-29 ENCOUNTER — Encounter: Payer: Self-pay | Admitting: Gynecologic Oncology

## 2020-12-29 ENCOUNTER — Inpatient Hospital Stay (HOSPITAL_BASED_OUTPATIENT_CLINIC_OR_DEPARTMENT_OTHER): Payer: BC Managed Care – PPO | Admitting: Gynecologic Oncology

## 2020-12-29 ENCOUNTER — Inpatient Hospital Stay: Payer: BC Managed Care – PPO

## 2020-12-29 ENCOUNTER — Other Ambulatory Visit: Payer: Self-pay

## 2020-12-29 ENCOUNTER — Telehealth: Payer: Self-pay

## 2020-12-29 VITALS — BP 129/94 | HR 100 | Temp 98.4°F | Resp 18 | Ht 63.0 in | Wt 194.6 lb

## 2020-12-29 DIAGNOSIS — R300 Dysuria: Secondary | ICD-10-CM | POA: Insufficient documentation

## 2020-12-29 DIAGNOSIS — K429 Umbilical hernia without obstruction or gangrene: Secondary | ICD-10-CM | POA: Insufficient documentation

## 2020-12-29 DIAGNOSIS — R978 Other abnormal tumor markers: Secondary | ICD-10-CM | POA: Insufficient documentation

## 2020-12-29 DIAGNOSIS — E079 Disorder of thyroid, unspecified: Secondary | ICD-10-CM | POA: Insufficient documentation

## 2020-12-29 DIAGNOSIS — N83209 Unspecified ovarian cyst, unspecified side: Secondary | ICD-10-CM

## 2020-12-29 DIAGNOSIS — E781 Pure hyperglyceridemia: Secondary | ICD-10-CM | POA: Insufficient documentation

## 2020-12-29 DIAGNOSIS — N939 Abnormal uterine and vaginal bleeding, unspecified: Secondary | ICD-10-CM | POA: Diagnosis not present

## 2020-12-29 DIAGNOSIS — Z79899 Other long term (current) drug therapy: Secondary | ICD-10-CM | POA: Insufficient documentation

## 2020-12-29 DIAGNOSIS — E669 Obesity, unspecified: Secondary | ICD-10-CM | POA: Insufficient documentation

## 2020-12-29 DIAGNOSIS — R7982 Elevated C-reactive protein (CRP): Secondary | ICD-10-CM | POA: Insufficient documentation

## 2020-12-29 DIAGNOSIS — D398 Neoplasm of uncertain behavior of other specified female genital organs: Secondary | ICD-10-CM | POA: Diagnosis not present

## 2020-12-29 DIAGNOSIS — R350 Frequency of micturition: Secondary | ICD-10-CM | POA: Diagnosis not present

## 2020-12-29 DIAGNOSIS — M069 Rheumatoid arthritis, unspecified: Secondary | ICD-10-CM | POA: Insufficient documentation

## 2020-12-29 LAB — URINALYSIS, COMPLETE (UACMP) WITH MICROSCOPIC
Bilirubin Urine: NEGATIVE
Glucose, UA: NEGATIVE mg/dL
Ketones, ur: NEGATIVE mg/dL
Nitrite: NEGATIVE
Protein, ur: 100 mg/dL — AB
RBC / HPF: >50 RBC/hpf — ABNORMAL HIGH (ref 0–5)
Specific Gravity, Urine: 1.017 (ref 1.005–1.030)
WBC, UA: >50 WBC/hpf — ABNORMAL HIGH (ref 0–5)
pH: 6 (ref 5.0–8.0)

## 2020-12-29 MED ORDER — TRAMADOL HCL 50 MG PO TABS: 50.0000 mg | ORAL_TABLET | Freq: Four times a day (QID) | ORAL | 0 refills | Status: DC | PRN

## 2020-12-29 MED ORDER — IBUPROFEN 800 MG PO TABS
800.0000 mg | ORAL_TABLET | Freq: Three times a day (TID) | ORAL | 0 refills | Status: DC | PRN
Start: 2020-12-29 — End: 2021-06-09

## 2020-12-29 MED ORDER — SENNOSIDES-DOCUSATE SODIUM 8.6-50 MG PO TABS: 2.0000 | ORAL_TABLET | Freq: Every day | ORAL | 0 refills | Status: DC

## 2020-12-29 NOTE — Telephone Encounter (Signed)
Told Brittney Herrera that the urinalysis shows that there is a possible UTI.  Brittney John, NP wanted to see how your symptoms are.  Brittney Herrera stated that her her urinary symptoms are mild.  She will wait for the urine culture to result to begin an ATB.  She will take some AZO if she needs to.  Pt is afebrile.

## 2020-12-29 NOTE — H&P (View-Only) (Signed)
GYNECOLOGIC ONCOLOGY NEW PATIENT CONSULTATION   Patient Name: Brittney Herrera  Patient Age: 45 y.o. Date of Service: 12/29/20 Referring Provider: Aloha Gell, MD  Primary Care Provider: Everardo Beals, NP Consulting Provider: Jeral Pinch, MD   Assessment/Plan:  Premenopausal patient with complex adnexal mass, abnormal uterine bleeding.  Reviewed imaging findings from recent ultrasound (I only have to the report) as well as ultrasound from a year ago. Mass present in the adnexa, has grown several centimeters. Mass continues to be complex with interretation raising possibility of dermoid. We also discussed recent tumor markers and their inability to diagnose benign versus cancerous process. The patient is asymptomatic, but given size and appearance of the mass, I recommend proceeding with diagnostic surgery.   In the setting of abnormal uterine bleeding, I recommend that we assess the endometrium. 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 I suspect that bleeding changes is related to anovulatory bleeding or hormonal changes. Will plan for endometrial sampling first and send for frozen section.  We discussed possible diagnoses with regard to the adnexa at the time of frozen section including benign mass, borderline tumor, and malignancy. If benign mass, I recommend UO with bilateral salpingectomy. In the setting of borderline tumor, we discussed that completion surgery is recommended once childbearing is complete. At age 80, I think its reasonable to proceed with completion surgery if indicated. If malignancy encountered, we discussed total hysterectomy but also staging, including possible lymphadenectomy, omentectomy.  Patient was a periumbilical hernia and is interested in repair. We discussed that joint surgery with general surgery might take 2-3 months to coordinate. If hernia is as small as I appreciate on exam, and I  think that I can repair it primarily at the time of surgery, I am happy to do this. Her preference is not to wait to try to coordinate joint surgery and is understanding of possible need to delay repair until a later time.    We discussed plan for a Myosure endometrial sampling and hysteroscopy, robotic assisted unilateral oophorectomy, bilateral salpingectomy, possible total hysterectomy, possible staging, possible laparotomy, possible primary repair of umbilical hernia. The risks of surgery were discussed in detail and she understands these to include infection; wound separation; hernia; vaginal cuff separation, injury to adjacent organs such as bowel, bladder, blood vessels, ureters and nerves; bleeding which may require blood transfusion; anesthesia risk; thromboembolic events; possible death; unforeseen complications; possible need for re-exploration; medical complications such as heart attack, stroke, pleural effusion and pneumonia; and, if full lymphadenectomy is performed the risk of lymphedema and lymphocyst. The patient will receive DVT and antibiotic prophylaxis as indicated. She voiced a clear understanding. She had the opportunity to ask questions. Perioperative instructions were reviewed with her. Prescriptions for post-op medications were sent to her pharmacy of choice.  A copy of this note was sent to the patient's referring provider.   80 minutes of total time was spent for this patient encounter, including preparation, face-to-face counseling with the patient and coordination of care, and documentation of the encounter.   Jeral Pinch, MD  Division of Gynecologic Oncology  Department of Obstetrics and Gynecology  [facility]  ___________________________________________  Chief Complaint: Chief Complaint  Patient presents with   Cyst of ovary, unspecified laterality    History of Present Illness:  Brittney Herrera is a 45 y.o. y.o. female who is  seen in consultation at the request of Dr. Pamala Hurry for an evaluation of  a complex adnexal mass in the setting of some elevated tumor markers.  Patient reports presenting recently for gynecologic evaluation secondary to a 2-year history of abnormal menses.  She notes regular menses until last year when her menstrual cycles became longer and heavier.  Last November, she had a long, heavy menses and then has only had a couple of menstrual cycles this year.  She will go months without a cycle.  Her most recent 2 cycles were about a month apart.  Her bleeding is much lighter than it used to be, but requires a pad a little heavier than a panty liner.  She describes it as being thicker with some passage of clots.  She denies any intermenstrual bleeding.  She has some pain and cramping with her menses at baseline, has not been as often since her menstrual volume decreased.  Given menstrual changes, the patient underwent pelvic ultrasound in October that showed a 7.4 cm complex adnexal mass that had increased from a greatest diameter of 4 cm in November of last year.  CA-125 was normal, HE4 and Overa both elevated.  Patient endorses a good appetite without nausea or emesis.  She denies any abdominal bloating or early satiety.  She reports normal bowel function.  She currently feels as though she may be about to get a urinary tract infection.  She describes this as feeling irritation when she urinates and increased frequency.  She was treated recently for UTI.  Medical history is notable for rheumatoid arthritis.  She has been on prednisone and methotrexate in the past.  Most recently she was on Leflunomide but notes that she is no longer taking this.  She sees her rheumatologist soon to discuss starting a new medication.  Since being off medication completely, she notes increased swelling of her hands and feet as well as soreness.  In terms of her surgical history, she had a tubal ligation and was told that the  fallopian tube on one side appeared affected at the time of surgery and was removed.  Patient lives in [place] with her husband and 2 girls, ages 28 and 65 years old.  She denies any tobacco use, reports that social alcohol use.  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  PAST MEDICAL HISTORY:  Past Medical History:  Diagnosis Date   Bicornuate uterus    Hemorrhoids 11/18/2020   History of abnormal cervical Pap smear    Hypertriglyceridemia 11/18/2020   Rheumatoid aortitis    Thyroid disease    Umbilical hernia 16/11/9602     PAST SURGICAL HISTORY:  Past Surgical History:  Procedure Laterality Date   ADENOIDECTOMY  02/22/1982   CESAREAN SECTION  04/07/2005   CESAREAN SECTION  09/24/2011   cyst N/A 12/15/2020   2 scalp cysts removed, benign   DILATION AND CURETTAGE, DIAGNOSTIC / THERAPEUTIC  12/23/2012   MYRINGOTOMY WITH TUBE PLACEMENT     TUBAL LIGATION Bilateral 12/23/2012   WISDOM TOOTH EXTRACTION  02/22/1993    OB/GYN HISTORY:  OB History  Gravida Para Term Preterm AB Living  2 2 1 1   2   SAB IAB Ectopic Multiple Live Births               # Outcome Date GA Lbr Len/2nd Weight Sex Delivery Anes PTL Lv  2 Term           1 Preterm  No LMP recorded.  Age at menarche: 73  Age at menopause: Not applicable Hx of HRT: Not applicable Hx of STDs: Denies Last pap: 11/2019 History of abnormal pap smears: Yes, reports abnormal Pap smear in 2010.  She underwent biopsy but did not require any procedures.  She has had normal Pap smears since  SCREENING STUDIES:  Last mammogram: 11/2020  Last colonoscopy: Has never had  MEDICATIONS: Outpatient Encounter Medications as of 12/29/2020  Medication Sig   ibuprofen (ADVIL) 800 MG tablet Take 1 tablet (800 mg total) by mouth every 8 (eight) hours as needed for moderate pain. For AFTER surgery only   levothyroxine (SYNTHROID) 125 MCG  tablet Take 125 mcg by mouth daily.   senna-docusate (SENOKOT-S) 8.6-50 MG tablet Take 2 tablets by mouth at bedtime. For AFTER surgery, do not take if having diarrhea   traMADol (ULTRAM) 50 MG tablet Take 1 tablet (50 mg total) by mouth every 6 (six) hours as needed for severe pain. For AFTER surgery, do not take and drive   leflunomide (ARAVA) 10 MG tablet Take 10 mg by mouth daily.   No facility-administered encounter medications on file as of 12/29/2020.    ALLERGIES:  No Known Allergies   FAMILY HISTORY:  Family History  Problem Relation Age of Onset   Diabetes Father    Leukemia Father    Heart disease Maternal Grandmother    Heart disease Maternal Grandfather    Heart disease Paternal Grandmother    Skin cancer Other    Colon cancer Neg Hx    Breast cancer Neg Hx    Ovarian cancer Neg Hx    Endometrial cancer Neg Hx    Prostate cancer Neg Hx    Pancreatic cancer Neg Hx      SOCIAL HISTORY:  Social Connections: Not on file    REVIEW OF SYSTEMS:  Denies appetite changes, fevers, chills, fatigue, unexplained weight changes. Denies hearing loss, neck lumps or masses, mouth sores, ringing in ears or voice changes. Denies cough or wheezing.  Denies shortness of breath. Denies chest pain or palpitations. Denies leg swelling. Denies abdominal distention, pain, blood in stools, constipation, diarrhea, nausea, vomiting, or early satiety. Denies pain with intercourse, hematuria or incontinence. Denies hot flashes, pelvic pain, vaginal bleeding or vaginal discharge.   Denies joint pain, back pain or muscle pain/cramps. Denies itching, rash, or wounds. Denies dizziness, headaches, numbness or seizures. Denies swollen lymph nodes or glands, denies easy bruising or bleeding. Denies anxiety, depression, confusion, or decreased concentration.  Physical Exam:  Vital Signs for this encounter:  Blood pressure (!) 129/94, pulse 100, temperature 98.4 F (36.9 C), temperature source  Oral, resp. rate 18, height 5\' 3"  (1.6 m), weight 194 lb 9.6 oz (88.3 kg), SpO2 100 %. Body mass index is 34.47 kg/m. General: Alert, oriented, no acute distress.  HEENT: Normocephalic, atraumatic. Sclera anicteric.  Chest: Clear to auscultation bilaterally. No wheezes, rhonchi, or rales. Cardiovascular: Regular rate and rhythm, no murmurs, rubs, or gallops.  Abdomen: Obese. Normoactive bowel sounds. Soft, nondistended, nontender to palpation. No masses or hepatosplenomegaly appreciated. No palpable fluid wave.  Several centimeter periumbilical hernia. Extremities: Grossly normal range of motion. Warm, well perfused. No edema bilaterally.  Skin: No rashes or lesions.  Lymphatics: No cervical, supraclavicular, or inguinal adenopathy.  GU:  Normal external female genitalia. No lesions. No discharge or bleeding.             Bladder/urethra:  No lesions or masses, well supported bladder  Vagina: Well rugated, minimal old blood within the vaginal vault.             Cervix: Normal appearing, no lesions.             Uterus: 10-12 cm, mobile, bulbous at the fundus.  No parametrial involvement or nodularity.             Adnexa: Smooth mass filling the cul-de-sac, mobile.  Rectal: Deferred.  LABORATORY AND RADIOLOGIC DATA:  Outside medical records were reviewed to synthesize the above history, along with the history and physical obtained during the visit.   Pelvic ultrasound 12/2019: Uterus 11 x 6 x 4cm. Endometrium 31mm. Bicornuate vs septate uterus. Left ovary with irregular, heterogeneous appearance with a possible dermoid cyst measuring approximately 3.1 x 2.7 x 2.7 cm. Right ovary appears normal.  Pelvic ultrasound 11/27/20: Uterus 8.9 x 5.4 x 2.9cm, endometrial lining 74mm. Left ovary measures 8.5 x 5.9 x 5.7 cm. Mild internal vascularity noted.   Labs from 10/10: Overa 7.7 Ova1  5.0  CA-125  19.4 HE4  138

## 2020-12-29 NOTE — Patient Instructions (Signed)
Preparing for your Surgery  Plan for surgery on January 20, 2021 with Dr. Jeral Pinch at [place] will be scheduled for robotic assisted laparoscopic unilateral oophorectomy (removal of one ovary), bilateral salpingectomy (removal of both fallopian tubes), hysteroscopy with dilation and curettage of the uterus with Myosure, possible hernia repair, possible staging if a cancer is identified including possible robotic assisted total laparoscopic hysterectomy (removal of the uterus and cervix), possible laparotomy (larger incision on your abdomen if needed).  Pre-operative Testing -You will receive a phone call from presurgical testing at [facility] to arrange for a pre-operative appointment and lab work.  -Bring your insurance card, copy of an advanced directive if applicable, medication list  -At that visit, you will be asked to sign a consent for a possible blood transfusion in case a transfusion becomes necessary during surgery.  The need for a blood transfusion is rare but having consent is a necessary part of your care.     -You should not be taking blood thinners or aspirin at least ten days prior to surgery unless instructed by your surgeon.  -Do not take supplements such as fish oil (omega 3), red yeast rice, turmeric before your surgery. You want to avoid medications with aspirin in them including headache powders such as BC or Goody's), Excedrin migraine.  Day Before Surgery at [place] will be asked to take in a light diet the day before surgery. You will be advised you can have clear liquids up until 3 hours before your surgery.    Eat a light diet the day before surgery.  Examples including soups, broths, toast, yogurt, mashed potatoes.  AVOID GAS PRODUCING FOODS. Things to avoid include carbonated beverages (fizzy beverages, sodas), raw fruits and raw vegetables (uncooked), or beans.   If your bowels are filled with gas, your surgeon will have  difficulty visualizing your pelvic organs which increases your surgical risks.  Your role in recovery Your role is to become active as soon as directed by your doctor, while still giving yourself time to heal.  Rest when you feel tired. You will be asked to do the following in order to speed your recovery:  - Cough and breathe deeply. This helps to clear and expand your lungs and can prevent pneumonia after surgery.  - [place]. Do mild physical activity. Walking or moving your legs help your circulation and body functions return to normal. Do not try to get up or walk alone the first time after surgery.   -If you develop swelling on one leg or the other, pain in the back of your leg, redness/warmth in one of your legs, please call the office or go to the Emergency Room to have a doppler to rule out a blood clot. For shortness of breath, chest pain-seek care in the Emergency Room as soon as possible. - Actively manage your pain. Managing your pain lets you move in comfort. We will ask you to rate your pain on a scale of zero to 10. It is your responsibility to tell your doctor or nurse where and how much you hurt so your pain can be treated.  Special Considerations -If you are diabetic, you may be placed on insulin after surgery to have closer control over your blood sugars to promote healing and recovery.  This does not mean that you will be discharged on insulin.  If applicable, your oral antidiabetics will be resumed when you are tolerating a solid  diet.  -Your final pathology results from surgery should be available around one week after surgery and the results will be relayed to you when available.  -Dr. [place] Crocker is the surgeon that assists your GYN Oncologist with surgery.  If you end up staying the night, the next day after your surgery you will either see Dr. Denman George, Dr. Berline Lopes, or Dr. [place] Crocker.  -FMLA forms can be faxed to [phone] and please  allow 5-7 business days for completion.  Pain Management After Surgery -You have been prescribed your pain medication and bowel regimen medications before surgery so that you can have these available when you are discharged from the hospital. The pain medication is for use ONLY AFTER surgery and a new prescription will not be given.   -Make sure that you have Tylenol and Ibuprofen at home to use on a regular basis after surgery for pain control. We recommend alternating the medications every hour to six hours since they work differently and are processed in the body differently for pain relief.  -Review the attached handout on narcotic use and their risks and side effects.   Bowel Regimen -You have been prescribed Sennakot-S to take nightly to prevent constipation especially if you are taking the narcotic pain medication intermittently.  It is important to prevent constipation and drink adequate amounts of liquids. You can stop taking this medication when you are not taking pain medication and you are back on your normal bowel routine.  Risks of Surgery Risks of surgery are low but include bleeding, infection, damage to surrounding structures, re-operation, blood clots, and very rarely death.   Blood Transfusion Information (For the consent to be signed before surgery)  We will be checking your blood type before surgery so in case of emergencies, we will know what type of blood you would need.                                            WHAT IS A BLOOD TRANSFUSION?  A transfusion is the replacement of blood or some of its parts. Blood is made up of multiple cells which provide different functions. Red blood cells carry oxygen and are used for blood loss replacement. White blood cells fight against infection. Platelets control bleeding. Plasma helps clot blood. Other blood products are available for specialized needs, such as hemophilia or other clotting disorders. BEFORE THE TRANSFUSION   Who gives blood for transfusions?  You may be able to donate blood to be used at a later date on yourself (autologous donation). Relatives can be asked to donate blood. This is generally not any safer than if you have received blood from a stranger. The same precautions are taken to ensure safety when a relative's blood is donated. Healthy volunteers who are fully evaluated to make sure their blood is safe. This is blood bank blood. Transfusion therapy is the safest it has ever been in the practice of medicine. Before blood is taken from a donor, a complete history is taken to make sure that person has no history of diseases nor engages in risky social behavior (examples are intravenous drug use or sexual activity with multiple partners). The donor's travel history is screened to minimize risk of transmitting infections, such as malaria. The donated blood is tested for signs of infectious diseases, such as HIV and hepatitis. The blood is then tested to be  sure it is compatible with you in order to minimize the chance of a transfusion reaction. If you or a relative donates blood, this is often done in anticipation of surgery and is not appropriate for emergency situations. It takes many days to process the donated blood. RISKS AND COMPLICATIONS Although transfusion therapy is very safe and saves many lives, the main dangers of transfusion include:  Getting an infectious disease. Developing a transfusion reaction. This is an allergic reaction to something in the blood you were given. Every precaution is taken to prevent this. The decision to have a blood transfusion has been considered carefully by your caregiver before blood is given. Blood is not given unless the benefits outweigh the risks.  AFTER SURGERY INSTRUCTIONS  Return to work: 4-6 weeks if applicable  Activity: 1. Be up and out of the bed during the day.  Take a nap if needed.  You may walk up steps but be careful and use the hand rail.   Stair climbing will tire you more than you think, you may need to stop part way and rest.   2. No lifting or straining for 6 weeks over 10 pounds. No pushing, pulling, straining for 6 weeks.  3. No driving for 1 week(s).  Do not drive if you are taking narcotic pain medicine and make sure that your reaction time has returned.   4. You can shower as soon as the next day after surgery. Shower daily.  Use your regular soap and water (not directly on the incision) and pat your incision(s) dry afterwards; don't rub.  No tub baths or submerging your body in water until cleared by your surgeon. If you have the soap that was given to you by pre-surgical testing that was used before surgery, you do not need to use it afterwards because this can irritate your incisions.   5. No sexual activity and nothing in the vagina for 4 weeks. IF YOU HAVE A HYSTERECTOMY, IT WOULD BE FOR 8 WEEKS.  6. You may experience a small amount of clear drainage from your incisions, which is normal.  If the drainage persists, increases, or changes color please call the office.  7. Do not use creams, lotions, or ointments such as neosporin on your incisions after surgery until advised by your surgeon because they can cause removal of the dermabond glue on your incisions.    8. You may experience vaginal spotting after surgery or around the 6-8 week mark from surgery when the stitches at the top of the vagina begin to dissolve (IF YOU HAVE A HYSTERECTOMY).  The spotting is normal but if you experience heavy bleeding, call our office.  9. Take Tylenol or ibuprofen first for pain and only use narcotic pain medication for severe pain not relieved by the Tylenol or Ibuprofen.  Monitor your Tylenol intake to a max of 4,000 mg in a 24 hour period. You can alternate these medications after surgery.  Diet: 1. Low sodium Heart Healthy Diet is recommended but you are cleared to resume your normal (before surgery) diet after your  procedure.  2. It is safe to use a laxative, such as Miralax or Colace, if you have difficulty moving your bowels. You have been prescribed Sennakot at bedtime every evening to keep bowel movements regular and to prevent constipation.    Wound Care: 1. Keep clean and dry.  Shower daily.  Reasons to call the Doctor: Fever - Oral temperature greater than 100.4 degrees Fahrenheit Foul-smelling vaginal discharge  Difficulty urinating Nausea and vomiting Increased pain at the site of the incision that is unrelieved with pain medicine. Difficulty breathing with or without chest pain New calf pain especially if only on one side Sudden, continuing increased vaginal bleeding with or without clots.   Contacts: For questions or concerns you should contact:  Dr. Jeral Pinch at [phone]  [name], NP at [phone]  After Hours: call [phone] and have the GYN Oncologist paged/contacted (after 5 pm or on the weekends).  Messages sent via mychart are for non-urgent matters and are not responded to after hours so for urgent needs, please call the after hours number.

## 2020-12-29 NOTE — Progress Notes (Signed)
GYNECOLOGIC ONCOLOGY NEW PATIENT CONSULTATION   Patient Name: Brittney Herrera  Patient Age: 45 y.o. Date of Service: 12/29/20 Referring Provider: Aloha Gell, MD  Primary Care Provider: Everardo Beals, NP Consulting Provider: Jeral Pinch, MD   Assessment/Plan:  Premenopausal patient with complex adnexal mass, abnormal uterine bleeding.  Reviewed imaging findings from recent ultrasound (I only have to the report) as well as ultrasound from a year ago. Mass present in the adnexa, has grown several centimeters. Mass continues to be complex with interretation raising possibility of dermoid. We also discussed recent tumor markers and their inability to diagnose benign versus cancerous process. The patient is asymptomatic, but given size and appearance of the mass, I recommend proceeding with diagnostic surgery.   In the setting of abnormal uterine bleeding, I recommend that we assess the endometrium. 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 I suspect that bleeding changes is related to anovulatory bleeding or hormonal changes. Will plan for endometrial sampling first and send for frozen section.  We discussed possible diagnoses with regard to the adnexa at the time of frozen section including benign mass, borderline tumor, and malignancy. If benign mass, I recommend UO with bilateral salpingectomy. In the setting of borderline tumor, we discussed that completion surgery is recommended once childbearing is complete. At age 69, I think its reasonable to proceed with completion surgery if indicated. If malignancy encountered, we discussed total hysterectomy but also staging, including possible lymphadenectomy, omentectomy.  Patient was a periumbilical hernia and is interested in repair. We discussed that joint surgery with general surgery might take 2-3 months to coordinate. If hernia is as small as I appreciate on exam, and I  think that I can repair it primarily at the time of surgery, I am happy to do this. Her preference is not to wait to try to coordinate joint surgery and is understanding of possible need to delay repair until a later time.    We discussed plan for a Myosure endometrial sampling and hysteroscopy, robotic assisted unilateral oophorectomy, bilateral salpingectomy, possible total hysterectomy, possible staging, possible laparotomy, possible primary repair of umbilical hernia. The risks of surgery were discussed in detail and she understands these to include infection; wound separation; hernia; vaginal cuff separation, injury to adjacent organs such as bowel, bladder, blood vessels, ureters and nerves; bleeding which may require blood transfusion; anesthesia risk; thromboembolic events; possible death; unforeseen complications; possible need for re-exploration; medical complications such as heart attack, stroke, pleural effusion and pneumonia; and, if full lymphadenectomy is performed the risk of lymphedema and lymphocyst. The patient will receive DVT and antibiotic prophylaxis as indicated. She voiced a clear understanding. She had the opportunity to ask questions. Perioperative instructions were reviewed with her. Prescriptions for post-op medications were sent to her pharmacy of choice.  A copy of this note was sent to the patient's referring provider.   80 minutes of total time was spent for this patient encounter, including preparation, face-to-face counseling with the patient and coordination of care, and documentation of the encounter.   Jeral Pinch, MD  Division of Gynecologic Oncology  Department of Obstetrics and Gynecology  [facility]  ___________________________________________  Chief Complaint: Chief Complaint  Patient presents with   Cyst of ovary, unspecified laterality    History of Present Illness:  Brittney Herrera is a 45 y.o. y.o. female who is  seen in consultation at the request of Dr. Pamala Hurry for an evaluation of  a complex adnexal mass in the setting of some elevated tumor markers.  Patient reports presenting recently for gynecologic evaluation secondary to a 2-year history of abnormal menses.  She notes regular menses until last year when her menstrual cycles became longer and heavier.  Last November, she had a long, heavy menses and then has only had a couple of menstrual cycles this year.  She will go months without a cycle.  Her most recent 2 cycles were about a month apart.  Her bleeding is much lighter than it used to be, but requires a pad a little heavier than a panty liner.  She describes it as being thicker with some passage of clots.  She denies any intermenstrual bleeding.  She has some pain and cramping with her menses at baseline, has not been as often since her menstrual volume decreased.  Given menstrual changes, the patient underwent pelvic ultrasound in October that showed a 7.4 cm complex adnexal mass that had increased from a greatest diameter of 4 cm in November of last year.  CA-125 was normal, HE4 and Overa both elevated.  Patient endorses a good appetite without nausea or emesis.  She denies any abdominal bloating or early satiety.  She reports normal bowel function.  She currently feels as though she may be about to get a urinary tract infection.  She describes this as feeling irritation when she urinates and increased frequency.  She was treated recently for UTI.  Medical history is notable for rheumatoid arthritis.  She has been on prednisone and methotrexate in the past.  Most recently she was on Leflunomide but notes that she is no longer taking this.  She sees her rheumatologist soon to discuss starting a new medication.  Since being off medication completely, she notes increased swelling of her hands and feet as well as soreness.  In terms of her surgical history, she had a tubal ligation and was told that the  fallopian tube on one side appeared affected at the time of surgery and was removed.  Patient lives in [place] with her husband and 2 girls, ages 41 and 48 years old.  She denies any tobacco use, reports that social alcohol use.  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  PAST MEDICAL HISTORY:  Past Medical History:  Diagnosis Date   Bicornuate uterus    Hemorrhoids 11/18/2020   History of abnormal cervical Pap smear    Hypertriglyceridemia 11/18/2020   Rheumatoid aortitis    Thyroid disease    Umbilical hernia 85/63/1497     PAST SURGICAL HISTORY:  Past Surgical History:  Procedure Laterality Date   ADENOIDECTOMY  02/22/1982   CESAREAN SECTION  04/07/2005   CESAREAN SECTION  09/24/2011   cyst N/A 12/15/2020   2 scalp cysts removed, benign   DILATION AND CURETTAGE, DIAGNOSTIC / THERAPEUTIC  12/23/2012   MYRINGOTOMY WITH TUBE PLACEMENT     TUBAL LIGATION Bilateral 12/23/2012   WISDOM TOOTH EXTRACTION  02/22/1993    OB/GYN HISTORY:  OB History  Gravida Para Term Preterm AB Living  2 2 1 1   2   SAB IAB Ectopic Multiple Live Births               # Outcome Date GA Lbr Len/2nd Weight Sex Delivery Anes PTL Lv  2 Term           1 Preterm  No LMP recorded.  Age at menarche: 60  Age at menopause: Not applicable Hx of HRT: Not applicable Hx of STDs: Denies Last pap: 11/2019 History of abnormal pap smears: Yes, reports abnormal Pap smear in 2010.  She underwent biopsy but did not require any procedures.  She has had normal Pap smears since  SCREENING STUDIES:  Last mammogram: 11/2020  Last colonoscopy: Has never had  MEDICATIONS: Outpatient Encounter Medications as of 12/29/2020  Medication Sig   ibuprofen (ADVIL) 800 MG tablet Take 1 tablet (800 mg total) by mouth every 8 (eight) hours as needed for moderate pain. For AFTER surgery only   levothyroxine (SYNTHROID) 125 MCG  tablet Take 125 mcg by mouth daily.   senna-docusate (SENOKOT-S) 8.6-50 MG tablet Take 2 tablets by mouth at bedtime. For AFTER surgery, do not take if having diarrhea   traMADol (ULTRAM) 50 MG tablet Take 1 tablet (50 mg total) by mouth every 6 (six) hours as needed for severe pain. For AFTER surgery, do not take and drive   leflunomide (ARAVA) 10 MG tablet Take 10 mg by mouth daily.   No facility-administered encounter medications on file as of 12/29/2020.    ALLERGIES:  No Known Allergies   FAMILY HISTORY:  Family History  Problem Relation Age of Onset   Diabetes Father    Leukemia Father    Heart disease Maternal Grandmother    Heart disease Maternal Grandfather    Heart disease Paternal Grandmother    Skin cancer Other    Colon cancer Neg Hx    Breast cancer Neg Hx    Ovarian cancer Neg Hx    Endometrial cancer Neg Hx    Prostate cancer Neg Hx    Pancreatic cancer Neg Hx      SOCIAL HISTORY:  Social Connections: Not on file    REVIEW OF SYSTEMS:  Denies appetite changes, fevers, chills, fatigue, unexplained weight changes. Denies hearing loss, neck lumps or masses, mouth sores, ringing in ears or voice changes. Denies cough or wheezing.  Denies shortness of breath. Denies chest pain or palpitations. Denies leg swelling. Denies abdominal distention, pain, blood in stools, constipation, diarrhea, nausea, vomiting, or early satiety. Denies pain with intercourse, hematuria or incontinence. Denies hot flashes, pelvic pain, vaginal bleeding or vaginal discharge.   Denies joint pain, back pain or muscle pain/cramps. Denies itching, rash, or wounds. Denies dizziness, headaches, numbness or seizures. Denies swollen lymph nodes or glands, denies easy bruising or bleeding. Denies anxiety, depression, confusion, or decreased concentration.  Physical Exam:  Vital Signs for this encounter:  Blood pressure (!) 129/94, pulse 100, temperature 98.4 F (36.9 C), temperature source  Oral, resp. rate 18, height 5\' 3"  (1.6 m), weight 194 lb 9.6 oz (88.3 kg), SpO2 100 %. Body mass index is 34.47 kg/m. General: Alert, oriented, no acute distress.  HEENT: Normocephalic, atraumatic. Sclera anicteric.  Chest: Clear to auscultation bilaterally. No wheezes, rhonchi, or rales. Cardiovascular: Regular rate and rhythm, no murmurs, rubs, or gallops.  Abdomen: Obese. Normoactive bowel sounds. Soft, nondistended, nontender to palpation. No masses or hepatosplenomegaly appreciated. No palpable fluid wave.  Several centimeter periumbilical hernia. Extremities: Grossly normal range of motion. Warm, well perfused. No edema bilaterally.  Skin: No rashes or lesions.  Lymphatics: No cervical, supraclavicular, or inguinal adenopathy.  GU:  Normal external female genitalia. No lesions. No discharge or bleeding.             Bladder/urethra:  No lesions or masses, well supported bladder  Vagina: Well rugated, minimal old blood within the vaginal vault.             Cervix: Normal appearing, no lesions.             Uterus: 10-12 cm, mobile, bulbous at the fundus.  No parametrial involvement or nodularity.             Adnexa: Smooth mass filling the cul-de-sac, mobile.  Rectal: Deferred.  LABORATORY AND RADIOLOGIC DATA:  Outside medical records were reviewed to synthesize the above history, along with the history and physical obtained during the visit.   Pelvic ultrasound 12/2019: Uterus 11 x 6 x 4cm. Endometrium 83mm. Bicornuate vs septate uterus. Left ovary with irregular, heterogeneous appearance with a possible dermoid cyst measuring approximately 3.1 x 2.7 x 2.7 cm. Right ovary appears normal.  Pelvic ultrasound 11/27/20: Uterus 8.9 x 5.4 x 2.9cm, endometrial lining 62mm. Left ovary measures 8.5 x 5.9 x 5.7 cm. Mild internal vascularity noted.   Labs from 10/10: Overa 7.7 Ova1  5.0  CA-125  19.4 HE4  138

## 2020-12-30 ENCOUNTER — Other Ambulatory Visit: Payer: Self-pay | Admitting: Gynecologic Oncology

## 2020-12-30 ENCOUNTER — Telehealth: Payer: Self-pay

## 2020-12-30 DIAGNOSIS — N939 Abnormal uterine and vaginal bleeding, unspecified: Secondary | ICD-10-CM

## 2020-12-30 DIAGNOSIS — N83209 Unspecified ovarian cyst, unspecified side: Secondary | ICD-10-CM

## 2020-12-30 DIAGNOSIS — N390 Urinary tract infection, site not specified: Secondary | ICD-10-CM

## 2020-12-30 LAB — URINE CULTURE: Culture: 30000 — AB

## 2020-12-30 MED ORDER — AMPICILLIN 500 MG PO CAPS: 500.0000 mg | ORAL_CAPSULE | Freq: Four times a day (QID) | ORAL | 0 refills | Status: DC

## 2020-12-30 NOTE — Progress Notes (Signed)
Patient here with her husband for new patient consultation with Dr. Jeral Pinch and for a pre-operative discussion prior to her scheduled surgery on January 20, 2021. She is scheduled for robotic assisted laparoscopic unilateral oophorectomy, bilateral salpingectomy, hysteroscopy with dilation and curettage of the uterus with Myosure, possible hernia repair, possible staging if a cancer is identified including possible robotic assisted total laparoscopic hysterectomy, possible laparotomy. The surgery was discussed in detail.  See after visit summary for additional details. Visual aids used to discuss items related to surgery including sequential compression stockings, foley catheter, IV pump, multi-modal pain regimen including tylenol, photo of the surgical robot, female reproductive system to discuss surgery in detail.      Discussed post-op pain management in detail including the aspects of the enhanced recovery pathway.  Advised her that a new prescription would be sent in for tramadol and it is only to be used for after her upcoming surgery.  We discussed the use of tylenol post-op and to monitor for a maximum of 4,000 mg in a 24 hour period along with ibuprofen.  Also prescribed sennakot to be used after surgery and to hold if having loose stools.  Discussed bowel regimen in detail.  Our office will also reach out to the patient's rheumatologist prior to surgery. The patient is not currently for medications for RA and states there are plans to begin a new medication at her next appt.     Discussed the use of heparin pre-op, SCDs, and measures to take at home to prevent DVT including frequent mobility.  Reportable signs and symptoms of DVT discussed. Post-operative instructions discussed and expectations for after surgery. Incisional care discussed as well including reportable signs and symptoms including erythema, drainage, wound separation.     10 minutes spent with the patient.  Verbalizing  understanding of material discussed. No needs or concerns voiced at the end of the visit.   Advised patient and family to call for any needs.  Advised that her post-operative medications had been prescribed and could be picked up at any time.  A hysterectomy form was obtained. The patient reports having a recent UTI in October and feels her symptoms are returning. A urine sample has been obtained and the patient will be contacted with the results.  This appointment is included in the global surgical bundle as pre-operative teaching and has no charge.

## 2020-12-30 NOTE — Progress Notes (Signed)
Urine culture results returned. Given that the patient is symptomatic, Dr. Berline Lopes would like to treat.

## 2020-12-30 NOTE — Telephone Encounter (Signed)
Left message requesting return call. Re: urine culture results 12/29/20

## 2020-12-30 NOTE — Patient Instructions (Signed)
Preparing for your Surgery   Plan for surgery on January 20, 2021 with Dr. Jeral Pinch at [facility] will be scheduled for robotic assisted laparoscopic unilateral oophorectomy (removal of one ovary), bilateral salpingectomy (removal of both fallopian tubes), hysteroscopy with dilation and curettage of the uterus with Myosure, possible hernia repair, possible staging if a cancer is identified including possible robotic assisted total laparoscopic hysterectomy (removal of the uterus and cervix), possible laparotomy (larger incision on your abdomen if needed).   Pre-operative Testing -You will receive a phone call from presurgical testing at [facility] to arrange for a pre-operative appointment and lab work.   -Bring your insurance card, copy of an advanced directive if applicable, medication list   -At that visit, you will be asked to sign a consent for a possible blood transfusion in case a transfusion becomes necessary during surgery.  The need for a blood transfusion is rare but having consent is a necessary part of your care.      -You should not be taking blood thinners or aspirin at least ten days prior to surgery unless instructed by your surgeon.   -Do not take supplements such as fish oil (omega 3), red yeast rice, turmeric before your surgery. You want to avoid medications with aspirin in them including headache powders such as BC or Goody's), Excedrin migraine.   Day Before Surgery at [place] will be asked to take in a light diet the day before surgery. You will be advised you can have clear liquids up until 3 hours before your surgery.     Eat a light diet the day before surgery.  Examples including soups, broths, toast, yogurt, mashed potatoes.  AVOID GAS PRODUCING FOODS. Things to avoid include carbonated beverages (fizzy beverages, sodas), raw fruits and raw vegetables (uncooked), or beans.    If your bowels are filled with gas, your surgeon will  have difficulty visualizing your pelvic organs which increases your surgical risks.   Your role in recovery Your role is to become active as soon as directed by your doctor, while still giving yourself time to heal.  Rest when you feel tired. You will be asked to do the following in order to speed your recovery:   - Cough and breathe deeply. This helps to clear and expand your lungs and can prevent pneumonia after surgery.  - [place]. Do mild physical activity. Walking or moving your legs help your circulation and body functions return to normal. Do not try to get up or walk alone the first time after surgery.   -If you develop swelling on one leg or the other, pain in the back of your leg, redness/warmth in one of your legs, please call the office or go to the Emergency Room to have a doppler to rule out a blood clot. For shortness of breath, chest pain-seek care in the Emergency Room as soon as possible. - Actively manage your pain. Managing your pain lets you move in comfort. We will ask you to rate your pain on a scale of zero to 10. It is your responsibility to tell your doctor or nurse where and how much you hurt so your pain can be treated.   Special Considerations -If you are diabetic, you may be placed on insulin after surgery to have closer control over your blood sugars to promote healing and recovery.  This does not mean that you will be discharged on insulin.  If applicable,  your oral antidiabetics will be resumed when you are tolerating a solid diet.   -Your final pathology results from surgery should be available around one week after surgery and the results will be relayed to you when available.   -Dr. Lahoma Crocker is the surgeon that assists your GYN Oncologist with surgery.  If you end up staying the night, the next day after your surgery you will either see Dr. Denman George, Dr. Berline Lopes, or Dr. Lahoma Crocker.   -FMLA forms can be faxed to [phone] and  please allow 5-7 business days for completion.   Pain Management After Surgery -You have been prescribed your pain medication and bowel regimen medications before surgery so that you can have these available when you are discharged from the hospital. The pain medication is for use ONLY AFTER surgery and a new prescription will not be given.    -Make sure that you have Tylenol and Ibuprofen at home to use on a regular basis after surgery for pain control. We recommend alternating the medications every hour to six hours since they work differently and are processed in the body differently for pain relief.   -Review the attached handout on narcotic use and their risks and side effects.    Bowel Regimen -You have been prescribed Sennakot-S to take nightly to prevent constipation especially if you are taking the narcotic pain medication intermittently.  It is important to prevent constipation and drink adequate amounts of liquids. You can stop taking this medication when you are not taking pain medication and you are back on your normal bowel routine.   Risks of Surgery Risks of surgery are low but include bleeding, infection, damage to surrounding structures, re-operation, blood clots, and very rarely death.     Blood Transfusion Information (For the consent to be signed before surgery)   We will be checking your blood type before surgery so in case of emergencies, we will know what type of blood you would need.                                             WHAT IS A BLOOD TRANSFUSION?   A transfusion is the replacement of blood or some of its parts. Blood is made up of multiple cells which provide different functions. Red blood cells carry oxygen and are used for blood loss replacement. White blood cells fight against infection. Platelets control bleeding. Plasma helps clot blood. Other blood products are available for specialized needs, such as hemophilia or other clotting disorders. BEFORE  THE TRANSFUSION  Who gives blood for transfusions?  You may be able to donate blood to be used at a later date on yourself (autologous donation). Relatives can be asked to donate blood. This is generally not any safer than if you have received blood from a stranger. The same precautions are taken to ensure safety when a relative's blood is donated. Healthy volunteers who are fully evaluated to make sure their blood is safe. This is blood bank blood. Transfusion therapy is the safest it has ever been in the practice of medicine. Before blood is taken from a donor, a complete history is taken to make sure that person has no history of diseases nor engages in risky social behavior (examples are intravenous drug use or sexual activity with multiple partners). The donor's travel history is screened to minimize risk of transmitting infections,  such as malaria. The donated blood is tested for signs of infectious diseases, such as HIV and hepatitis. The blood is then tested to be sure it is compatible with you in order to minimize the chance of a transfusion reaction. If you or a relative donates blood, this is often done in anticipation of surgery and is not appropriate for emergency situations. It takes many days to process the donated blood. RISKS AND COMPLICATIONS Although transfusion therapy is very safe and saves many lives, the main dangers of transfusion include:  Getting an infectious disease. Developing a transfusion reaction. This is an allergic reaction to something in the blood you were given. Every precaution is taken to prevent this. The decision to have a blood transfusion has been considered carefully by your caregiver before blood is given. Blood is not given unless the benefits outweigh the risks.   AFTER SURGERY INSTRUCTIONS   Return to work: 4-6 weeks if applicable   Activity: 1. Be up and out of the bed during the day.  Take a nap if needed.  You may walk up steps but be careful and  use the hand rail.  Stair climbing will tire you more than you think, you may need to stop part way and rest.    2. No lifting or straining for 6 weeks over 10 pounds. No pushing, pulling, straining for 6 weeks.   3. No driving for 1 week(s).  Do not drive if you are taking narcotic pain medicine and make sure that your reaction time has returned.    4. You can shower as soon as the next day after surgery. Shower daily.  Use your regular soap and water (not directly on the incision) and pat your incision(s) dry afterwards; don't rub.  No tub baths or submerging your body in water until cleared by your surgeon. If you have the soap that was given to you by pre-surgical testing that was used before surgery, you do not need to use it afterwards because this can irritate your incisions.    5. No sexual activity and nothing in the vagina for 4 weeks. IF YOU HAVE A HYSTERECTOMY, IT WOULD BE FOR 8 WEEKS.   6. You may experience a small amount of clear drainage from your incisions, which is normal.  If the drainage persists, increases, or changes color please call the office.   7. Do not use creams, lotions, or ointments such as neosporin on your incisions after surgery until advised by your surgeon because they can cause removal of the dermabond glue on your incisions.     8. You may experience vaginal spotting after surgery or around the 6-8 week mark from surgery when the stitches at the top of the vagina begin to dissolve (IF YOU HAVE A HYSTERECTOMY).  The spotting is normal but if you experience heavy bleeding, call our office.   9. Take Tylenol or ibuprofen first for pain and only use narcotic pain medication for severe pain not relieved by the Tylenol or Ibuprofen.  Monitor your Tylenol intake to a max of 4,000 mg in a 24 hour period. You can alternate these medications after surgery.   Diet: 1. Low sodium Heart Healthy Diet is recommended but you are cleared to resume your normal (before surgery)  diet after your procedure.   2. It is safe to use a laxative, such as Miralax or Colace, if you have difficulty moving your bowels. You have been prescribed Sennakot at bedtime every evening to keep  bowel movements regular and to prevent constipation.     Wound Care: 1. Keep clean and dry.  Shower daily.   Reasons to call the Doctor: Fever - Oral temperature greater than 100.4 degrees Fahrenheit Foul-smelling vaginal discharge Difficulty urinating Nausea and vomiting Increased pain at the site of the incision that is unrelieved with pain medicine. Difficulty breathing with or without chest pain New calf pain especially if only on one side Sudden, continuing increased vaginal bleeding with or without clots.   Contacts: For questions or concerns you should contact:   Dr. Jeral Pinch at [phone]   [name], NP at [phone]   After Hours: call [phone] and have the GYN Oncologist paged/contacted (after 5 pm or on the weekends).   Messages sent via mychart are for non-urgent matters and are not responded to after hours so for urgent needs, please call the after hours number.

## 2020-12-31 NOTE — Telephone Encounter (Signed)
Reviewed urine culture results with Brittney Herrera. Per [name], NP urine has grown group B strep. A prescription for Ampicillin 500 mg every 6 hours for 5 days has been sent to pharmacy. Patient verbalized understanding. Instructed to call if symptoms persist or if she has any questions or concerns.

## 2021-01-01 ENCOUNTER — Telehealth: Payer: Self-pay | Admitting: *Deleted

## 2021-01-01 NOTE — Telephone Encounter (Signed)
Surgical optimization and office note fax to patient's rheumatoid office

## 2021-01-02 NOTE — Telephone Encounter (Signed)
Received rheumatoid clearance and fax to presurgical testing

## 2021-01-07 ENCOUNTER — Telehealth: Payer: Self-pay

## 2021-01-07 NOTE — Telephone Encounter (Signed)
Following up with Ms. Reveles. Our office received medication recommendations from patients Rheumatologist Dr. Kathlene November. He is recommending patient hold leflunomide one week prior to surgery and resume 2 weeks after surgery. During patients last visit with Dr. Berline Lopes patient stated she is no longer taking this. Called patient to discuss Dr. Oralia Rud recommendation. Patient states " I have not taken leflunomide since the beginning of October. He called me and told me to stop taking it due to my lab results. My next appointment with him is in December." Instructed patient to call our office if she has any changes to her medications prior to surgery. Patient verbalized understanding.

## 2021-01-08 DIAGNOSIS — R7989 Other specified abnormal findings of blood chemistry: Secondary | ICD-10-CM | POA: Diagnosis not present

## 2021-01-08 NOTE — Patient Instructions (Signed)
DUE TO COVID-19 ONLY ONE VISITOR IS ALLOWED TO COME WITH YOU AND STAY IN THE WAITING ROOM ONLY DURING PRE OP AND PROCEDURE DAY OF SURGERY IF YOU ARE GOING HOME AFTER SURGERY. IF YOU ARE SPENDING THE NIGHT 2 PEOPLE MAY VISIT WITH YOU IN YOUR PRIVATE ROOM AFTER SURGERY UNTIL VISITING  HOURS ARE OVER AT 8:00 PM AND 1  VISITOR  MAY  SPEND THE NIGHT.                 Brittney Herrera     Your procedure is scheduled on: 01/20/21   Report to [facility] Main  Entrance   Report to admitting at  8:15 AM     Call this number if you have problems the morning of surgery [phone]    Have a light diet the day before surgery  Full Liquid Diet   Strained creamy soups Tea, Coffee- with cream or mild and sugar or honey  Juices- cranberry , grape and apple  Jello  Milkshakes  Pudding , custards  Popsicles  Water Plain ice cream f, frozen yogurt, sherbet, plain yogurt  Fruit ices and popsicles with no fruit pulp  Sugar, honey and syrups Clear broths  Boost, Ensure, Resource and other liquid supplements NO CARBONATED BEVERAGES    DRINK 2 PRESURGERY ENSURE DRINKS THE NIGHT BEFORE SURGERY AT 10:00 PM .  CLEAR LIQUIDS UNTIL THREE HOURS PRIOR TO SCHEDULED SURGERY. 6:00 AM  PLEASE FINISH PRESURGERY ENSURE DRINK PER SURGEON ORDER 3 HOURS PRIOR TO SCHEDULED SURGERY TIME WHICH NEEDS TO BE COMPLETED AT 6:00 am   CLEAR LIQUID DIET   Foods Allowed                                                                     Foods Excluded  Coffee and tea, regular and decaf                             liquids that you cannot  Plain Jell-O any favor except red or purple                                           see through such as: Fruit ices (not with fruit pulp)                                     milk, soups, orange juice  Iced Popsicles                                    All solid food Carbonated beverages, regular and diet                                    Cranberry, grape and apple  juices Sports drinks like Gatorade Lightly seasoned clear broth or consume(fat free) Sugar     BRUSH YOUR TEETH MORNING OF SURGERY  AND RINSE YOUR MOUTH OUT, NO CHEWING GUM CANDY OR MINTS.     Take these medicines the morning of surgery with A SIP OF WATER: Atenolol, Levothyroxine                                You may not have any metal on your body including hair pins and              piercings  Do not wear jewelry, make-up, lotions, powders or perfumes, deodorant             Do not wear nail polish on your fingernails.  Do not shave  48 hours prior to surgery.                 Do not bring valuables to the hospital. [place].  Contacts, dentures or bridgework may not be worn into surgery.     Patients discharged the day of surgery will not be allowed to drive home.  IF YOU ARE HAVING SURGERY AND GOING HOME THE SAME DAY, YOU MUST HAVE AN ADULT TO DRIVE YOU HOME AND BE WITH YOU FOR 24 HOURS. YOU MAY GO HOME BY TAXI OR UBER OR ORTHERWISE, BUT AN ADULT MUST ACCOMPANY YOU HOME AND STAY WITH YOU FOR 24 HOURS.  Name and phone number of your driver:  Special Instructions: N/A              Please read over the following fact sheets you were given: _____________________________________________________________________             [facility] - Preparing for Surgery Before surgery, you can play an important role.  Because skin is not sterile, your skin needs to be as free of germs as possible.  You can reduce the number of germs on your skin by washing with CHG (chlorahexidine gluconate) soap before surgery.  CHG is an antiseptic cleaner which kills germs and bonds with the skin to continue killing germs even after washing. Please DO NOT use if you have an allergy to CHG or antibacterial soaps.  If your skin becomes reddened/irritated stop using the CHG and inform your nurse when you arrive at Short Stay. Do not shave (including legs and  underarms) for at least 48 hours prior to the first CHG shower.   Please follow these instructions carefully:  1.  Shower with CHG Soap the night before surgery and the  morning of Surgery.  2.  If you choose to wash your hair, wash your hair first as usual with your  normal  shampoo.  3.  After you shampoo, rinse your hair and body thoroughly to remove the  shampoo.                            4.  Use CHG as you would any other liquid soap.  You can apply chg directly  to the skin and wash                       Gently with a scrungie or clean washcloth.  5.  Apply the CHG Soap to your body ONLY FROM THE NECK DOWN.   Do not use on face/ open  Wound or open sores. Avoid contact with eyes, ears mouth and genitals (private parts).                       Wash face,  Genitals (private parts) with your normal soap.             6.  Wash thoroughly, paying special attention to the area where your surgery  will be performed.  7.  Thoroughly rinse your body with warm water from the neck down.  8.  DO NOT shower/wash with your normal soap after using and rinsing off  the CHG Soap.                9.  Pat yourself dry with a clean towel.            10.  Wear clean pajamas.            11.  Place clean sheets on your bed the night of your first shower and do not  sleep with pets. Day of Surgery : Do not apply any lotions/deodorants the morning of surgery.  Please wear clean clothes to the hospital/surgery center.  FAILURE TO FOLLOW THESE INSTRUCTIONS MAY RESULT IN THE CANCELLATION OF YOUR SURGERY PATIENT SIGNATURE_________________________________  NURSE SIGNATURE__________________________________  ________________________________________________________________________

## 2021-01-09 ENCOUNTER — Encounter (HOSPITAL_COMMUNITY): Payer: Self-pay

## 2021-01-09 ENCOUNTER — Other Ambulatory Visit: Payer: Self-pay

## 2021-01-09 ENCOUNTER — Encounter (HOSPITAL_COMMUNITY)
Admission: RE | Admit: 2021-01-09 | Discharge: 2021-01-09 | Disposition: A | Discharge status: Home / Self Care | Payer: BC Managed Care – PPO | Source: Ambulatory Visit | Attending: Gynecologic Oncology | Admitting: Gynecologic Oncology

## 2021-01-09 DIAGNOSIS — Z01812 Encounter for preprocedural laboratory examination: Secondary | ICD-10-CM | POA: Diagnosis not present

## 2021-01-09 DIAGNOSIS — N83209 Unspecified ovarian cyst, unspecified side: Secondary | ICD-10-CM | POA: Insufficient documentation

## 2021-01-09 DIAGNOSIS — N939 Abnormal uterine and vaginal bleeding, unspecified: Secondary | ICD-10-CM | POA: Insufficient documentation

## 2021-01-09 HISTORY — DX: Unspecified osteoarthritis, unspecified site: M19.90

## 2021-01-09 HISTORY — DX: Hypothyroidism, unspecified: E03.9

## 2021-01-09 LAB — CBC
HCT: 43.5 % (ref 36.0–46.0)
Hemoglobin: 14.2 g/dL (ref 12.0–15.0)
MCH: 33.7 pg (ref 26.0–34.0)
MCHC: 32.6 g/dL (ref 30.0–36.0)
MCV: 103.3 fL — ABNORMAL HIGH (ref 80.0–100.0)
Platelets: 384 10*3/uL (ref 150–400)
RBC: 4.21 MIL/uL (ref 3.87–5.11)
RDW: 12.6 % (ref 11.5–15.5)
WBC: 7.2 10*3/uL (ref 4.0–10.5)
nRBC: 0 % (ref 0.0–0.2)

## 2021-01-09 LAB — COMPREHENSIVE METABOLIC PANEL
ALT: 35 U/L (ref 0–44)
AST: 45 U/L — ABNORMAL HIGH (ref 15–41)
Albumin: 3.4 g/dL — ABNORMAL LOW (ref 3.5–5.0)
Alkaline Phosphatase: 123 U/L (ref 38–126)
Anion gap: 10 (ref 5–15)
BUN: 6 mg/dL (ref 6–20)
CO2: 27 mmol/L (ref 22–32)
Calcium: 9 mg/dL (ref 8.9–10.3)
Chloride: 97 mmol/L — ABNORMAL LOW (ref 98–111)
Creatinine, Ser: 0.73 mg/dL (ref 0.44–1.00)
GFR, Estimated: >60 mL/min (ref >60)
Glucose, Bld: 113 mg/dL — ABNORMAL HIGH (ref 70–99)
Potassium: 3.8 mmol/L (ref 3.5–5.1)
Sodium: 134 mmol/L — ABNORMAL LOW (ref 135–145)
Total Bilirubin: 0.6 mg/dL (ref 0.3–1.2)
Total Protein: 7.7 g/dL (ref 6.5–8.1)

## 2021-01-09 LAB — TYPE AND SCREEN
ABO/RH(D): A POS
Antibody Screen: NEGATIVE

## 2021-01-09 NOTE — Progress Notes (Signed)
COVID test- NA   PCP - Dr. Rosie Fate NP Cardiologist - none  Rheumatologist- Dr. Lennie Muckle  Chest x-ray - no EKG - no Stress Test - no ECHO - no Cardiac Cath - no Pacemaker/ICD device last checked:Na  Sleep Study - no CPAP -   Fasting Blood Sugar - NA Checks Blood Sugar _____ times a day  Blood Thinner Instructions:NA Aspirin Instructions: Last Dose:  Anesthesia review: no  Patient denies shortness of breath, fever, cough and chest pain at PAT appointment No SOB with any activities. She has RA and has stopped her medicatuions  Patient verbalized understanding of instructions that were given to them at the PAT appointment. Patient was also instructed that they will need to review over the PAT instructions again at home before surgery. yes

## 2021-01-19 ENCOUNTER — Telehealth: Payer: Self-pay

## 2021-01-19 NOTE — Telephone Encounter (Signed)
Telephone call to check on pre-operative status.  Patient compliant with pre-operative instructions.  Reinforced nothing to eat after midnight. Clear liquids until 3 hours prior to procedure. No questions or concerns voiced.  Instructed to call for any needs.

## 2021-01-20 ENCOUNTER — Ambulatory Visit (HOSPITAL_COMMUNITY): Payer: BC Managed Care – PPO | Admitting: Anesthesiology

## 2021-01-20 ENCOUNTER — Encounter (HOSPITAL_COMMUNITY)
Admission: AD | Disposition: A | Discharge status: Home / Self Care | Payer: Self-pay | Source: Home / Self Care | Attending: Gynecologic Oncology

## 2021-01-20 ENCOUNTER — Inpatient Hospital Stay (HOSPITAL_COMMUNITY)
Admission: AD | Admit: 2021-01-20 | Discharge: 2021-01-28 | DRG: 742 | Disposition: A | Discharge status: Home / Self Care | Payer: BC Managed Care – PPO | Attending: Gynecologic Oncology | Admitting: Gynecologic Oncology

## 2021-01-20 ENCOUNTER — Encounter (HOSPITAL_COMMUNITY): Payer: Self-pay | Admitting: Gynecologic Oncology

## 2021-01-20 ENCOUNTER — Other Ambulatory Visit: Payer: Self-pay

## 2021-01-20 DIAGNOSIS — N135 Crossing vessel and stricture of ureter without hydronephrosis: Secondary | ICD-10-CM | POA: Diagnosis not present

## 2021-01-20 DIAGNOSIS — K429 Umbilical hernia without obstruction or gangrene: Secondary | ICD-10-CM | POA: Diagnosis present

## 2021-01-20 DIAGNOSIS — K9189 Other postprocedural complications and disorders of digestive system: Secondary | ICD-10-CM | POA: Diagnosis not present

## 2021-01-20 DIAGNOSIS — E781 Pure hyperglyceridemia: Secondary | ICD-10-CM | POA: Diagnosis not present

## 2021-01-20 DIAGNOSIS — N939 Abnormal uterine and vaginal bleeding, unspecified: Secondary | ICD-10-CM | POA: Diagnosis not present

## 2021-01-20 DIAGNOSIS — N3289 Other specified disorders of bladder: Secondary | ICD-10-CM

## 2021-01-20 DIAGNOSIS — E860 Dehydration: Secondary | ICD-10-CM | POA: Diagnosis not present

## 2021-01-20 DIAGNOSIS — Z87891 Personal history of nicotine dependence: Secondary | ICD-10-CM

## 2021-01-20 DIAGNOSIS — N736 Female pelvic peritoneal adhesions (postinfective): Secondary | ICD-10-CM

## 2021-01-20 DIAGNOSIS — R1900 Intra-abdominal and pelvic swelling, mass and lump, unspecified site: Secondary | ICD-10-CM | POA: Diagnosis present

## 2021-01-20 DIAGNOSIS — I10 Essential (primary) hypertension: Secondary | ICD-10-CM

## 2021-01-20 DIAGNOSIS — R030 Elevated blood-pressure reading, without diagnosis of hypertension: Secondary | ICD-10-CM | POA: Diagnosis not present

## 2021-01-20 DIAGNOSIS — Q5128 Other doubling of uterus, other specified: Secondary | ICD-10-CM | POA: Diagnosis not present

## 2021-01-20 DIAGNOSIS — N852 Hypertrophy of uterus: Secondary | ICD-10-CM | POA: Diagnosis present

## 2021-01-20 DIAGNOSIS — Z20822 Contact with and (suspected) exposure to covid-19: Secondary | ICD-10-CM | POA: Diagnosis not present

## 2021-01-20 DIAGNOSIS — E441 Mild protein-calorie malnutrition: Secondary | ICD-10-CM

## 2021-01-20 DIAGNOSIS — K649 Unspecified hemorrhoids: Secondary | ICD-10-CM | POA: Diagnosis present

## 2021-01-20 DIAGNOSIS — K660 Peritoneal adhesions (postprocedural) (postinfection): Secondary | ICD-10-CM | POA: Diagnosis not present

## 2021-01-20 DIAGNOSIS — N9489 Other specified conditions associated with female genital organs and menstrual cycle: Secondary | ICD-10-CM | POA: Diagnosis not present

## 2021-01-20 DIAGNOSIS — Z806 Family history of leukemia: Secondary | ICD-10-CM | POA: Diagnosis not present

## 2021-01-20 DIAGNOSIS — N83209 Unspecified ovarian cyst, unspecified side: Secondary | ICD-10-CM | POA: Diagnosis not present

## 2021-01-20 DIAGNOSIS — R1909 Other intra-abdominal and pelvic swelling, mass and lump: Secondary | ICD-10-CM | POA: Diagnosis present

## 2021-01-20 DIAGNOSIS — Z833 Family history of diabetes mellitus: Secondary | ICD-10-CM

## 2021-01-20 DIAGNOSIS — N820 Vesicovaginal fistula: Secondary | ICD-10-CM | POA: Diagnosis not present

## 2021-01-20 DIAGNOSIS — F419 Anxiety disorder, unspecified: Secondary | ICD-10-CM | POA: Diagnosis not present

## 2021-01-20 DIAGNOSIS — R32 Unspecified urinary incontinence: Secondary | ICD-10-CM | POA: Diagnosis not present

## 2021-01-20 DIAGNOSIS — R188 Other ascites: Secondary | ICD-10-CM | POA: Diagnosis not present

## 2021-01-20 DIAGNOSIS — Y838 Other surgical procedures as the cause of abnormal reaction of the patient, or of later complication, without mention of misadventure at the time of the procedure: Secondary | ICD-10-CM | POA: Diagnosis not present

## 2021-01-20 DIAGNOSIS — Z808 Family history of malignant neoplasm of other organs or systems: Secondary | ICD-10-CM

## 2021-01-20 DIAGNOSIS — N322 Vesical fistula, not elsewhere classified: Secondary | ICD-10-CM | POA: Diagnosis not present

## 2021-01-20 DIAGNOSIS — K567 Ileus, unspecified: Secondary | ICD-10-CM

## 2021-01-20 DIAGNOSIS — G8918 Other acute postprocedural pain: Secondary | ICD-10-CM | POA: Diagnosis not present

## 2021-01-20 DIAGNOSIS — R978 Other abnormal tumor markers: Secondary | ICD-10-CM | POA: Diagnosis present

## 2021-01-20 DIAGNOSIS — R141 Gas pain: Secondary | ICD-10-CM

## 2021-01-20 DIAGNOSIS — S36893A Laceration of other intra-abdominal organs, initial encounter: Secondary | ICD-10-CM | POA: Diagnosis not present

## 2021-01-20 DIAGNOSIS — Z87440 Personal history of urinary (tract) infections: Secondary | ICD-10-CM

## 2021-01-20 DIAGNOSIS — E039 Hypothyroidism, unspecified: Secondary | ICD-10-CM | POA: Diagnosis present

## 2021-01-20 DIAGNOSIS — F418 Other specified anxiety disorders: Secondary | ICD-10-CM

## 2021-01-20 DIAGNOSIS — R140 Abdominal distension (gaseous): Secondary | ICD-10-CM | POA: Diagnosis not present

## 2021-01-20 DIAGNOSIS — R42 Dizziness and giddiness: Secondary | ICD-10-CM | POA: Diagnosis not present

## 2021-01-20 DIAGNOSIS — R000 Tachycardia, unspecified: Secondary | ICD-10-CM | POA: Diagnosis not present

## 2021-01-20 DIAGNOSIS — Z8249 Family history of ischemic heart disease and other diseases of the circulatory system: Secondary | ICD-10-CM | POA: Diagnosis not present

## 2021-01-20 DIAGNOSIS — E871 Hypo-osmolality and hyponatremia: Secondary | ICD-10-CM | POA: Diagnosis not present

## 2021-01-20 DIAGNOSIS — M069 Rheumatoid arthritis, unspecified: Secondary | ICD-10-CM | POA: Diagnosis not present

## 2021-01-20 DIAGNOSIS — Z0189 Encounter for other specified special examinations: Secondary | ICD-10-CM

## 2021-01-20 DIAGNOSIS — N858 Other specified noninflammatory disorders of uterus: Secondary | ICD-10-CM | POA: Diagnosis not present

## 2021-01-20 DIAGNOSIS — N838 Other noninflammatory disorders of ovary, fallopian tube and broad ligament: Secondary | ICD-10-CM | POA: Diagnosis not present

## 2021-01-20 HISTORY — PX: DILATATION & CURETTAGE/HYSTEROSCOPY WITH MYOSURE: SHX6511

## 2021-01-20 HISTORY — PX: XI ROBOTIC ASSISTED SALPINGECTOMY: SHX6824

## 2021-01-20 LAB — ABO/RH: ABO/RH(D): A POS

## 2021-01-20 LAB — PREGNANCY, URINE: Preg Test, Ur: NEGATIVE

## 2021-01-20 SURGERY — SALPINGECTOMY, ROBOT-ASSISTED
Anesthesia: General | Laterality: Right

## 2021-01-20 MED ORDER — SUGAMMADEX SODIUM 200 MG/2ML IV SOLN
INTRAVENOUS | Status: DC | PRN
  Administered 2021-01-20: 200 mg via INTRAVENOUS

## 2021-01-20 MED ORDER — SENNOSIDES-DOCUSATE SODIUM 8.6-50 MG PO TABS
2.0000 | ORAL_TABLET | Freq: Every day | ORAL | Status: DC
  Administered 2021-01-20 – 2021-01-27 (×7): 2 via ORAL
  Filled 2021-01-20 (×6): qty 2

## 2021-01-20 MED ORDER — KETAMINE HCL 10 MG/ML IJ SOLN
INTRAMUSCULAR | Status: DC | PRN
  Administered 2021-01-20: 30 mg via INTRAVENOUS
  Administered 2021-01-20: 10 mg via INTRAVENOUS

## 2021-01-20 MED ORDER — ERYTHROMYCIN BASE 250 MG PO TABS: 1000.0000 mg | ORAL_TABLET | Freq: Four times a day (QID) | ORAL | Status: DC

## 2021-01-20 MED ORDER — ONDANSETRON HCL 4 MG/2ML IJ SOLN
4.0000 mg | Freq: Four times a day (QID) | INTRAMUSCULAR | Status: DC | PRN
  Administered 2021-01-21 – 2021-01-23 (×2): 4 mg via INTRAVENOUS
  Filled 2021-01-20 (×2): qty 2

## 2021-01-20 MED ORDER — EPHEDRINE 5 MG/ML INJ
INTRAVENOUS | Status: AC
  Filled 2021-01-20: qty 5

## 2021-01-20 MED ORDER — DEXAMETHASONE SODIUM PHOSPHATE 4 MG/ML IJ SOLN: 4.0000 mg | INTRAMUSCULAR | Status: DC

## 2021-01-20 MED ORDER — ENSURE PRE-SURGERY PO LIQD: 592.0000 mL | Freq: Once | ORAL | Status: DC

## 2021-01-20 MED ORDER — ACETAMINOPHEN 500 MG PO TABS
1000.0000 mg | ORAL_TABLET | Freq: Four times a day (QID) | ORAL | Status: DC
  Administered 2021-01-20 – 2021-01-21 (×4): 1000 mg via ORAL
  Filled 2021-01-20 (×3): qty 2

## 2021-01-20 MED ORDER — ROCURONIUM BROMIDE 10 MG/ML (PF) SYRINGE
PREFILLED_SYRINGE | INTRAVENOUS | Status: DC | PRN
  Administered 2021-01-20: 20 mg via INTRAVENOUS
  Administered 2021-01-20: 60 mg via INTRAVENOUS

## 2021-01-20 MED ORDER — PROMETHAZINE HCL 25 MG/ML IJ SOLN: 6.2500 mg | INTRAMUSCULAR | Status: DC | PRN

## 2021-01-20 MED ORDER — FENTANYL CITRATE PF 50 MCG/ML IJ SOSY
25.0000 ug | PREFILLED_SYRINGE | INTRAMUSCULAR | Status: DC | PRN
  Administered 2021-01-20 (×2): 25 ug via INTRAVENOUS

## 2021-01-20 MED ORDER — LIDOCAINE HCL (PF) 2 % IJ SOLN
INTRAMUSCULAR | Status: AC
  Filled 2021-01-20: qty 15

## 2021-01-20 MED ORDER — FENTANYL CITRATE (PF) 100 MCG/2ML IJ SOLN
INTRAMUSCULAR | Status: DC | PRN
  Administered 2021-01-20 (×4): 25 ug via INTRAVENOUS
  Administered 2021-01-20: 100 ug via INTRAVENOUS

## 2021-01-20 MED ORDER — LACTATED RINGERS IV SOLN: INTRAVENOUS | Status: DC

## 2021-01-20 MED ORDER — SODIUM CHLORIDE 0.9 % IR SOLN
Status: DC | PRN
  Administered 2021-01-20: 3000 mL

## 2021-01-20 MED ORDER — GABAPENTIN 300 MG PO CAPS
ORAL_CAPSULE | ORAL | Status: AC
  Filled 2021-01-20: qty 1

## 2021-01-20 MED ORDER — NEOMYCIN SULFATE 500 MG PO TABS: 1000.0000 mg | ORAL_TABLET | Freq: Once | ORAL | Status: DC

## 2021-01-20 MED ORDER — FENTANYL CITRATE (PF) 100 MCG/2ML IJ SOLN
INTRAMUSCULAR | Status: AC
  Filled 2021-01-20: qty 2

## 2021-01-20 MED ORDER — DEXAMETHASONE SODIUM PHOSPHATE 10 MG/ML IJ SOLN
INTRAMUSCULAR | Status: AC
  Filled 2021-01-20: qty 1

## 2021-01-20 MED ORDER — HYDROMORPHONE HCL 1 MG/ML IJ SOLN
0.5000 mg | INTRAMUSCULAR | Status: DC | PRN
  Administered 2021-01-20 – 2021-01-21 (×2): 0.5 mg via INTRAVENOUS
  Filled 2021-01-20 (×2): qty 0.5

## 2021-01-20 MED ORDER — LIDOCAINE HCL (PF) 2 % IJ SOLN
INTRAMUSCULAR | Status: AC
  Filled 2021-01-20: qty 5

## 2021-01-20 MED ORDER — LACTATED RINGERS IR SOLN
Status: DC | PRN
  Administered 2021-01-20: 1000 mL

## 2021-01-20 MED ORDER — LEVOTHYROXINE SODIUM 125 MCG PO TABS
125.0000 ug | ORAL_TABLET | Freq: Every day | ORAL | Status: DC
  Administered 2021-01-21 – 2021-01-28 (×7): 125 ug via ORAL
  Filled 2021-01-20 (×7): qty 1

## 2021-01-20 MED ORDER — NEOMYCIN SULFATE 500 MG PO TABS
1000.0000 mg | ORAL_TABLET | ORAL | Status: DC
  Filled 2021-01-20 (×2): qty 2

## 2021-01-20 MED ORDER — PROPOFOL 10 MG/ML IV BOLUS
INTRAVENOUS | Status: DC | PRN
  Administered 2021-01-20: 170 mg via INTRAVENOUS

## 2021-01-20 MED ORDER — ONDANSETRON HCL 4 MG PO TABS: 4.0000 mg | ORAL_TABLET | Freq: Four times a day (QID) | ORAL | Status: DC | PRN

## 2021-01-20 MED ORDER — FENTANYL CITRATE PF 50 MCG/ML IJ SOSY
PREFILLED_SYRINGE | INTRAMUSCULAR | Status: AC
  Filled 2021-01-20: qty 2

## 2021-01-20 MED ORDER — HEPARIN SODIUM (PORCINE) 5000 UNIT/ML IJ SOLN
5000.0000 [IU] | INTRAMUSCULAR | Status: AC
  Administered 2021-01-20: 5000 [IU] via SUBCUTANEOUS

## 2021-01-20 MED ORDER — ROCURONIUM BROMIDE 10 MG/ML (PF) SYRINGE
PREFILLED_SYRINGE | INTRAVENOUS | Status: AC
  Filled 2021-01-20: qty 10

## 2021-01-20 MED ORDER — PROPOFOL 10 MG/ML IV BOLUS
INTRAVENOUS | Status: AC
  Filled 2021-01-20: qty 20

## 2021-01-20 MED ORDER — GABAPENTIN 300 MG PO CAPS
300.0000 mg | ORAL_CAPSULE | ORAL | Status: AC
  Administered 2021-01-20: 300 mg via ORAL

## 2021-01-20 MED ORDER — CEFAZOLIN SODIUM-DEXTROSE 2-4 GM/100ML-% IV SOLN
2.0000 g | INTRAVENOUS | Status: AC
  Administered 2021-01-20: 2 g via INTRAVENOUS

## 2021-01-20 MED ORDER — PHENYLEPHRINE HCL-NACL 20-0.9 MG/250ML-% IV SOLN
INTRAVENOUS | Status: DC | PRN
  Administered 2021-01-20: 40 ug/min via INTRAVENOUS

## 2021-01-20 MED ORDER — TRAMADOL HCL 50 MG PO TABS
100.0000 mg | ORAL_TABLET | Freq: Four times a day (QID) | ORAL | Status: DC | PRN
  Administered 2021-01-24 – 2021-01-28 (×7): 100 mg via ORAL
  Filled 2021-01-20 (×7): qty 2

## 2021-01-20 MED ORDER — SCOPOLAMINE 1 MG/3DAYS TD PT72
1.0000 | MEDICATED_PATCH | TRANSDERMAL | Status: DC
  Administered 2021-01-20: 1.5 mg via TRANSDERMAL

## 2021-01-20 MED ORDER — DEXAMETHASONE SODIUM PHOSPHATE 10 MG/ML IJ SOLN
INTRAMUSCULAR | Status: DC | PRN
  Administered 2021-01-20: 8 mg via INTRAVENOUS

## 2021-01-20 MED ORDER — ONDANSETRON HCL 4 MG/2ML IJ SOLN
INTRAMUSCULAR | Status: AC
  Filled 2021-01-20: qty 2

## 2021-01-20 MED ORDER — ONDANSETRON HCL 4 MG/2ML IJ SOLN
INTRAMUSCULAR | Status: DC | PRN
  Administered 2021-01-20: 4 mg via INTRAVENOUS

## 2021-01-20 MED ORDER — MIDAZOLAM HCL 5 MG/5ML IJ SOLN
INTRAMUSCULAR | Status: DC | PRN
  Administered 2021-01-20: 2 mg via INTRAVENOUS

## 2021-01-20 MED ORDER — ACETAMINOPHEN 500 MG PO TABS
1000.0000 mg | ORAL_TABLET | ORAL | Status: AC
  Administered 2021-01-20: 1000 mg via ORAL

## 2021-01-20 MED ORDER — ACETAMINOPHEN 500 MG PO TABS
ORAL_TABLET | ORAL | Status: AC
  Filled 2021-01-20: qty 2

## 2021-01-20 MED ORDER — OXYCODONE HCL 5 MG PO TABS
5.0000 mg | ORAL_TABLET | ORAL | Status: DC | PRN
  Administered 2021-01-20 – 2021-01-27 (×19): 5 mg via ORAL
  Filled 2021-01-20 (×19): qty 1

## 2021-01-20 MED ORDER — ORAL CARE MOUTH RINSE: 15.0000 mL | Freq: Once | OROMUCOSAL | Status: AC

## 2021-01-20 MED ORDER — CELECOXIB 200 MG PO CAPS
400.0000 mg | ORAL_CAPSULE | ORAL | Status: AC
  Administered 2021-01-20: 400 mg via ORAL

## 2021-01-20 MED ORDER — CEFAZOLIN SODIUM-DEXTROSE 2-4 GM/100ML-% IV SOLN
INTRAVENOUS | Status: AC
  Filled 2021-01-20: qty 100

## 2021-01-20 MED ORDER — POLYETHYLENE GLYCOL 3350 17 GM/SCOOP PO POWD
1.0000 | Freq: Once | ORAL | Status: AC
  Administered 2021-01-21: 255 g via ORAL
  Filled 2021-01-20: qty 255

## 2021-01-20 MED ORDER — INFLUENZA VAC SPLIT QUAD 0.5 ML IM SUSY: 0.5000 mL | PREFILLED_SYRINGE | INTRAMUSCULAR | Status: DC

## 2021-01-20 MED ORDER — BUPIVACAINE HCL 0.25 % IJ SOLN
INTRAMUSCULAR | Status: DC | PRN
  Administered 2021-01-20: 31 mL

## 2021-01-20 MED ORDER — LIDOCAINE HCL (PF) 2 % IJ SOLN
INTRAMUSCULAR | Status: DC | PRN
  Administered 2021-01-20: 1.5 mg/kg/h via INTRADERMAL

## 2021-01-20 MED ORDER — MIDAZOLAM HCL 2 MG/2ML IJ SOLN
INTRAMUSCULAR | Status: AC
  Filled 2021-01-20: qty 2

## 2021-01-20 MED ORDER — KETAMINE HCL 10 MG/ML IJ SOLN
INTRAMUSCULAR | Status: AC
  Filled 2021-01-20: qty 1

## 2021-01-20 MED ORDER — ERYTHROMYCIN BASE 250 MG PO TABS
1000.0000 mg | ORAL_TABLET | ORAL | Status: DC
  Filled 2021-01-20 (×2): qty 4

## 2021-01-20 MED ORDER — KCL IN DEXTROSE-NACL 20-5-0.45 MEQ/L-%-% IV SOLN
INTRAVENOUS | Status: DC
  Filled 2021-01-20 (×11): qty 1000

## 2021-01-20 MED ORDER — ENSURE PRE-SURGERY PO LIQD: 296.0000 mL | Freq: Once | ORAL | Status: DC

## 2021-01-20 MED ORDER — BUPIVACAINE HCL 0.25 % IJ SOLN
INTRAMUSCULAR | Status: AC
  Filled 2021-01-20: qty 1

## 2021-01-20 MED ORDER — CELECOXIB 200 MG PO CAPS
ORAL_CAPSULE | ORAL | Status: AC
  Filled 2021-01-20: qty 2

## 2021-01-20 MED ORDER — SCOPOLAMINE 1 MG/3DAYS TD PT72
MEDICATED_PATCH | TRANSDERMAL | Status: AC
  Filled 2021-01-20: qty 1

## 2021-01-20 MED ORDER — LIDOCAINE 2% (20 MG/ML) 5 ML SYRINGE
INTRAMUSCULAR | Status: DC | PRN
  Administered 2021-01-20: 80 mg via INTRAVENOUS

## 2021-01-20 MED ORDER — HEPARIN SODIUM (PORCINE) 5000 UNIT/ML IJ SOLN
INTRAMUSCULAR | Status: AC
  Filled 2021-01-20: qty 1

## 2021-01-20 MED ORDER — CHLORHEXIDINE GLUCONATE 0.12 % MT SOLN
15.0000 mL | Freq: Once | OROMUCOSAL | Status: AC
  Administered 2021-01-20: 15 mL via OROMUCOSAL

## 2021-01-20 SURGICAL SUPPLY — 87 items
APPLICATOR ARISTA FLEXITIP XL (MISCELLANEOUS) ×4 IMPLANT
APPLICATOR SURGIFLO ENDO (HEMOSTASIS) IMPLANT
BAG COUNTER SPONGE SURGICOUNT (BAG) ×3 IMPLANT
BAG LAPAROSCOPIC 12 15 PORT 16 (BASKET) IMPLANT
BAG RETRIEVAL 12/15 (BASKET)
BAG RETRIEVAL 12/15MM (BASKET)
BAG SURGICOUNT SPONGE COUNTING (BAG) ×1
BIPOLAR CUTTING LOOP 21FR (ELECTRODE)
BLADE SURG SZ10 CARB STEEL (BLADE) IMPLANT
COVER BACK TABLE 60X90IN (DRAPES) ×4 IMPLANT
COVER TIP SHEARS 8 DVNC (MISCELLANEOUS) ×2 IMPLANT
COVER TIP SHEARS 8MM DA VINCI (MISCELLANEOUS) ×2
DECANTER SPIKE VIAL GLASS SM (MISCELLANEOUS) ×4 IMPLANT
DERMABOND ADVANCED (GAUZE/BANDAGES/DRESSINGS) ×2
DERMABOND ADVANCED .7 DNX12 (GAUZE/BANDAGES/DRESSINGS) ×2 IMPLANT
DEVICE MYOSURE LITE (MISCELLANEOUS) IMPLANT
DEVICE MYOSURE REACH (MISCELLANEOUS) ×4 IMPLANT
DILATOR CANAL MILEX (MISCELLANEOUS) IMPLANT
DRAPE ARM DVNC X/XI (DISPOSABLE) ×8 IMPLANT
DRAPE COLUMN DVNC XI (DISPOSABLE) ×2 IMPLANT
DRAPE DA VINCI XI ARM (DISPOSABLE) ×8
DRAPE DA VINCI XI COLUMN (DISPOSABLE) ×2
DRAPE SHEET LG 3/4 BI-LAMINATE (DRAPES) ×4 IMPLANT
DRAPE SURG IRRIG POUCH 19X23 (DRAPES) ×4 IMPLANT
DRSG OPSITE POSTOP 4X6 (GAUZE/BANDAGES/DRESSINGS) IMPLANT
DRSG OPSITE POSTOP 4X8 (GAUZE/BANDAGES/DRESSINGS) IMPLANT
DRSG TEGADERM 8X12 (GAUZE/BANDAGES/DRESSINGS) ×4 IMPLANT
DRSG TELFA 3X8 NADH (GAUZE/BANDAGES/DRESSINGS) IMPLANT
ELECT PENCIL ROCKER SW 15FT (MISCELLANEOUS) IMPLANT
ELECT REM PT RETURN 15FT ADLT (MISCELLANEOUS) ×4 IMPLANT
GAUZE 4X4 16PLY ~~LOC~~+RFID DBL (SPONGE) ×4 IMPLANT
GLOVE SURG ENC MOIS LTX SZ6 (GLOVE) ×16 IMPLANT
GLOVE SURG ENC MOIS LTX SZ6.5 (GLOVE) ×8 IMPLANT
GOWN STRL REUS W/ TWL LRG LVL3 (GOWN DISPOSABLE) ×10 IMPLANT
GOWN STRL REUS W/ TWL XL LVL3 (GOWN DISPOSABLE) ×4 IMPLANT
GOWN STRL REUS W/TWL LRG LVL3 (GOWN DISPOSABLE) ×18 IMPLANT
GOWN STRL REUS W/TWL XL LVL3 (GOWN DISPOSABLE) ×4
HEMOSTAT ARISTA ABSORB 3G PWDR (HEMOSTASIS) ×4 IMPLANT
HOLDER FOLEY CATH W/STRAP (MISCELLANEOUS) ×4 IMPLANT
IRRIG SUCT STRYKERFLOW 2 WTIP (MISCELLANEOUS) ×4
IRRIGATION SUCT STRKRFLW 2 WTP (MISCELLANEOUS) ×2 IMPLANT
IV NS IRRIG 3000ML ARTHROMATIC (IV SOLUTION) ×4 IMPLANT
KIT PROCEDURE FLUENT (KITS) ×4 IMPLANT
KIT TURNOVER KIT A (KITS) IMPLANT
LOOP CUTTING BIPOLAR 21FR (ELECTRODE) IMPLANT
MANIPULATOR ADVINCU DEL 3.0 PL (MISCELLANEOUS) ×4 IMPLANT
MANIPULATOR UTERINE 4.5 ZUMI (MISCELLANEOUS) IMPLANT
MYOSURE XL FIBROID (MISCELLANEOUS)
NEEDLE HYPO 21X1.5 SAFETY (NEEDLE) ×4 IMPLANT
OBTURATOR OPTICAL STANDARD 8MM (TROCAR) ×2
OBTURATOR OPTICAL STND 8 DVNC (TROCAR) ×2
OBTURATOR OPTICALSTD 8 DVNC (TROCAR) ×2 IMPLANT
PACK ROBOT GYN CUSTOM WL (TRAY / TRAY PROCEDURE) ×4 IMPLANT
PAD OB MATERNITY 4.3X12.25 (PERSONAL CARE ITEMS) ×4 IMPLANT
PAD POSITIONING PINK XL (MISCELLANEOUS) ×4 IMPLANT
PAD PREP 24X48 CUFFED NSTRL (MISCELLANEOUS) IMPLANT
PORT ACCESS TROCAR AIRSEAL 12 (TROCAR) ×2 IMPLANT
PORT ACCESS TROCAR AIRSEAL 5M (TROCAR) ×2
POUCH SPECIMEN RETRIEVAL 10MM (ENDOMECHANICALS) IMPLANT
SCRUB EXIDINE 4% CHG 4OZ (MISCELLANEOUS) ×4 IMPLANT
SEAL CANN UNIV 5-8 DVNC XI (MISCELLANEOUS) ×10 IMPLANT
SEAL ROD LENS SCOPE MYOSURE (ABLATOR) IMPLANT
SEAL XI 5MM-8MM UNIVERSAL (MISCELLANEOUS) ×10
SET TRI-LUMEN FLTR TB AIRSEAL (TUBING) ×4 IMPLANT
SIGMOIDOSCOPE DISPOSABLE (MISCELLANEOUS) ×4 IMPLANT
SOL ANTI FOG 6CC (MISCELLANEOUS) ×2 IMPLANT
SOLUTION ANTI FOG 6CC (MISCELLANEOUS) ×2
SPONGE T-LAP 18X18 ~~LOC~~+RFID (SPONGE) IMPLANT
SURGIFLO W/THROMBIN 8M KIT (HEMOSTASIS) IMPLANT
SUT MNCRL AB 4-0 PS2 18 (SUTURE) IMPLANT
SUT PDS AB 1 TP1 96 (SUTURE) IMPLANT
SUT VIC AB 0 CT1 27 (SUTURE)
SUT VIC AB 0 CT1 27XBRD ANTBC (SUTURE) IMPLANT
SUT VIC AB 2-0 CT1 27 (SUTURE)
SUT VIC AB 2-0 CT1 TAPERPNT 27 (SUTURE) IMPLANT
SUT VIC AB 4-0 PS2 18 (SUTURE) ×8 IMPLANT
SYR 10ML LL (SYRINGE) IMPLANT
SYSTEM TISS REMOVAL MYOSURE XL (MISCELLANEOUS) IMPLANT
TOWEL OR 17X26 10 PK STRL BLUE (TOWEL DISPOSABLE) IMPLANT
TOWEL OR NON WOVEN STRL DISP B (DISPOSABLE) ×4 IMPLANT
TRAP SPECIMEN MUCUS 40CC (MISCELLANEOUS) ×4 IMPLANT
TRAY FOLEY MTR SLVR 16FR STAT (SET/KITS/TRAYS/PACK) ×4 IMPLANT
TROCAR XCEL NON-BLD 5MMX100MML (ENDOMECHANICALS) IMPLANT
UNDERPAD 30X36 HEAVY ABSORB (UNDERPADS AND DIAPERS) ×8 IMPLANT
WATER STERILE IRR 1000ML POUR (IV SOLUTION) ×4 IMPLANT
WATER STERILE IRR 500ML POUR (IV SOLUTION) IMPLANT
YANKAUER SUCT BULB TIP 10FT TU (MISCELLANEOUS) IMPLANT

## 2021-01-20 NOTE — Anesthesia Postprocedure Evaluation (Signed)
Anesthesia Post Note  Patient: Brittney Herrera  Procedure(s) Performed: XI ROBOTIC ASSISTED RIGHT  SALPINGECTOMY, LEFT OVARIAN BIOPSY (Right) DILATATION & CURETTAGE/HYSTEROSCOPY WITH [place]     Patient location during evaluation: PACU Anesthesia Type: General Level of consciousness: awake and alert Pain management: pain level controlled Vital Signs Assessment: post-procedure vital signs reviewed and stable Respiratory status: spontaneous breathing, nonlabored ventilation, respiratory function stable and patient connected to nasal cannula oxygen Cardiovascular status: blood pressure returned to baseline and stable Postop Assessment: no apparent nausea or vomiting Anesthetic complications: no   No notable events documented.  Last Vitals:  Vitals:   01/20/21 1513 01/20/21 1624  BP: (!) 118/91 118/80  Pulse:  69  Resp:  17  Temp:  37 C  SpO2: 95% 97%    Last Pain:  Vitals:   01/20/21 1624  TempSrc: Oral  PainSc:                  Catalina Gravel

## 2021-01-20 NOTE — Anesthesia Procedure Notes (Signed)
Procedure Name: Intubation Date/Time: 01/20/2021 10:26 AM Performed by: Milford Cage, CRNA Pre-anesthesia Checklist: Patient identified, Emergency Drugs available, Suction available and Patient being monitored Patient Re-evaluated:Patient Re-evaluated prior to induction Oxygen Delivery Method: Circle system utilized Preoxygenation: Pre-oxygenation with 100% oxygen Induction Type: IV induction Ventilation: Mask ventilation without difficulty Laryngoscope Size: Miller and 2 Grade View: Grade II Tube type: Oral Tube size: 7.0 mm Number of attempts: 2 Airway Equipment and Method: Bougie stylet Placement Confirmation: ETT inserted through vocal cords under direct vision, positive ETCO2 and breath sounds checked- equal and bilateral Secured at: 23 cm Tube secured with: Tape Dental Injury: Teeth and Oropharynx as per pre-operative assessment  Comments: G2b view with Mil2 - unable to place ETT. Used bougie stylet with successful placement

## 2021-01-20 NOTE — Brief Op Note (Signed)
01/20/2021  3:42 PM  PATIENT:  Brittney Herrera  45 y.o. female  PRE-OPERATIVE DIAGNOSIS:  COMPLEX PELVIC MASS,ABNORMAL UTERINE BLEEDING  POST-OPERATIVE DIAGNOSIS:  COMPLEX PELVIC MASS,ABNORMAL UTERINE BLEEDING  PROCEDURE:  Procedure(s): XI ROBOTIC ASSISTED RIGHT  SALPINGECTOMY, LEFT OVARIAN BIOPSY (Right) DILATATION & CURETTAGE/HYSTEROSCOPY WITH MYOSURE (N/A)  SURGEON:  Surgeon(s) and Role:    Lafonda Mosses, MD - Primary    * Johnathan Hausen, MD - Assisting    * Lahoma Crocker, MD  EBL:  150 mL   BLOOD ADMINISTERED:none  DRAINS: none   LOCAL MEDICATIONS USED:  MARCAINE     SPECIMEN:  endometrium, ovarian biopsy  DISPOSITION OF SPECIMEN:  PATHOLOGY  COUNTS:  YES  TOURNIQUET:  * No tourniquets in log *  DICTATION: .Note written in EPIC  PLAN OF CARE: Admit for overnight observation  PATIENT DISPOSITION:  PACU - hemodynamically stable.   Delay start of Pharmacological VTE agent (>24hrs) due to surgical blood loss or risk of bleeding: no

## 2021-01-20 NOTE — Anesthesia Preprocedure Evaluation (Addendum)
Anesthesia Evaluation  Patient identified by MRN, date of birth, ID band Patient awake    Reviewed: Allergy & Precautions, NPO status , Patient's Chart, lab work & pertinent test results  Airway Mallampati: II  TM Distance: >3 FB Neck ROM: Full    Dental  (+) Teeth Intact, Dental Advisory Given, Caps,    Pulmonary former smoker,    Pulmonary exam normal breath sounds clear to auscultation       Cardiovascular Exercise Tolerance: Good (-) hypertensionnegative cardio ROS Normal cardiovascular exam Rhythm:Regular Rate:Normal     Neuro/Psych negative neurological ROS  negative psych ROS   GI/Hepatic negative GI ROS, Neg liver ROS,   Endo/Other  Hypothyroidism Obesity   Renal/GU negative Renal ROS     Musculoskeletal  (+) Arthritis , Rheumatoid disorders,  COMPLEX PELVIC MASS,ABNORMAL UTERINE BLEEDING   Abdominal   Peds  Hematology negative hematology ROS (+)   Anesthesia Other Findings Day of surgery medications reviewed with the patient.  Reproductive/Obstetrics                            Anesthesia Physical Anesthesia Plan  ASA: 2  Anesthesia Plan: General   Post-op Pain Management: Tylenol PO (pre-op) and Gabapentin PO (pre-op)   Induction: Intravenous  PONV Risk Score and Plan: 4 or greater and Scopolamine patch - Pre-op, Midazolam, Dexamethasone and Ondansetron  Airway Management Planned: Oral ETT  Additional Equipment:   Intra-op Plan:   Post-operative Plan: Extubation in OR  Informed Consent: I have reviewed the patients History and Physical, chart, labs and discussed the procedure including the risks, benefits and alternatives for the proposed anesthesia with the patient or authorized representative who has indicated his/her understanding and acceptance.     Dental advisory given  Plan Discussed with: CRNA  Anesthesia Plan Comments: (2nd PIV after induction )         Anesthesia Quick Evaluation

## 2021-01-20 NOTE — Transfer of Care (Signed)
Immediate Anesthesia Transfer of Care Note  Patient: [name]  Procedure(s) Performed: XI ROBOTIC ASSISTED RIGHT  SALPINGECTOMY, LEFT OVARIAN BIOPSY (Right) DILATATION & CURETTAGE/HYSTEROSCOPY WITH [place]  Patient Location: PACU  Anesthesia Type:General  Level of Consciousness: awake  Airway & Oxygen Therapy: Patient Spontanous Breathing and Patient connected to face mask oxygen  Post-op Assessment: Report given to RN and Post -op Vital signs reviewed and stable  Post vital signs: Reviewed and stable  Last Vitals:  Vitals Value Taken Time  BP 103/83 01/20/21 1315  Temp 36.7 C 01/20/21 1314  Pulse 85 01/20/21 1317  Resp 17 01/20/21 1317  SpO2 96 % 01/20/21 1317  Vitals shown include unvalidated device data.  Last Pain:  Vitals:   01/20/21 1314  TempSrc:   PainSc: Asleep         Complications: No notable events documented.

## 2021-01-20 NOTE — Op Note (Signed)
OPERATIVE NOTE  Pre-operative Diagnosis: Complex adnexal mass, elevated tumor markers  Post-operative Diagnosis: same, enlarged septate uterus, retroperitoneal fibrosis and inflammation, 6cm left adnexal, dense adherence of adnexal mass to sigmoid colon  Operation: Hysteroscopy, endometrial sampling using the Myosure under hysteroscopic guidance, diagnostic laparoscopy, robotic right salpingectomy, enterolysis, aborted hysterectomy and left salpingo-oophorectomy  Surgeon: Jeral Pinch MD  Assistant Surgeon: Lahoma Crocker MD (an MD assistant was necessary for tissue manipulation, management of robotic instrumentation, retraction and positioning due to the complexity of the case and hospital policies).   Intra-operative consultant: Dr. Kaylyn Lim, General Surgery  Anesthesia: GET  Urine Output: 200, cloudy and brown; urine culture sent  Fluid deficit: 115 cc  Operative Findings: On EUA, mobile enlarged uterus, rectum free from fullness in the cul de sac. On hysteroscopy, septate uterus noted with two uterine horns. Right tubal ostia seen, left no appreciated. Some polypoid appearing tissue in the right horn. Left horn with fluffy overgrowth, no masses. On intra-abdominal entry, normal upper abdominal survey. Normal omentum and small bowel. Uterus enlarged, measuring 10-12cm. Right adnexa normal appearing. Left ovary retroperitonealized with mass measuring 6-8cm. Left adnexa densely adherent to the sigmoid colon and mesentery. Significant edema and fibrosis of the left retroperitoneum. Bladder densely adherent to the cervix and lower uterine segment. No ascites. No obvious intra-abdominal or pelvic evidence of disease.  Frozen section of endometrial curettings with benign endometrium. Frozen section of ovarian mass - inflamed stroma, no ovarian parenchyma noted.  Intra-operative consult with Dr. Hassell Done given dense adhesions of the mass to the sigmoid and concern for injury with  further dissection in the setting of no bowel prep. Dr. Hassell Done performed proctoscopy to a depth of 18cm with no colon injury or invasion noted. Bubble test negative. Given unanticipated bowel involvement without a bowel prep (would have necessitated colostomy if bowel injury), decision made to abandon procedure with plan to return after adequate bowel prep, patient counseling.   Estimated Blood Loss:  150cc      Total IV Fluids: see I&O flowsheet         Specimens: endometrial curettings, right distal fallopian tube         Complications:  None apparent; patient tolerated the procedure well.         Disposition: PACU - hemodynamically stable.  Procedure Details  The patient was seen in the Holding Room. The risks, benefits, complications, treatment options, and expected outcomes were discussed with the patient.  The patient concurred with the proposed plan, giving informed consent.  The site of surgery properly noted/marked. The patient was identified as Brittney Herrera and the procedure verified as a Robotic-assisted UO, BS, possible hysterectomy, possible staging.   After induction of anesthesia, the patient was draped and prepped in the usual sterile manner. Patient was placed in supine position after anesthesia and draped and prepped in the usual sterile manner as follows: Her arms were tucked to her side with all appropriate precautions.  The shoulders were stabilized with padded shoulder blocks applied to the acromium processes.  The patient was placed in the semi-lithotomy position in [place].  The perineum and vagina were prepped with CholoraPrep. The patient was draped after the CholoraPrep had been allowed to dry for 3 minutes.  A Time Out was held and the above information confirmed.  The urethra was prepped with Betadine. Foley catheter was placed.  A sterile speculum was placed in the vagina.  The cervix was grasped with a single-tooth tenaculum. The cervix was dilated  with Kennon Rounds  dilators to 59F. Hysteroscope (myosure) was then inserted with findings noted above. After both uterine horns were examined, sampling was performed under direct visualization with the Myosure Reach. Tissue was collected in the sock and sent to pathology for frozen section. After hysteroscopy was completed, the Delineator uterine manipulator with a colpotomizer ring was placed without difficulty.  A pneum occluder balloon was placed over the manipulator.  OG tube placement was confirmed and to suction.   Next, a 10 mm skin incision was made 1 cm below the subcostal margin in the midclavicular line.  The 5 mm Optiview port and scope was used for direct entry.  Opening pressure was under 10 mm CO2.  The abdomen was insufflated and the findings were noted as above.   At this point and all points during the procedure, the patient's intra-abdominal pressure did not exceed 15 mmHg. Next, an 8 mm skin incision was made superior and to the left of the umbilicus and a right and left port were placed about 8 cm lateral to the robot port on the right and left side.  A fourth arm was placed on the right.  The 5 mm assist trocar was exchanged for a 10-12 mm port. All ports were placed under direct visualization.  The patient was placed in steep Trendelenburg.  Bowel was folded away into the upper abdomen.  The robot was docked in the normal manner.  Pelvic washings were collected. Attention was first turned to the left. The peritoneum was opened parallel to the IP ligament to open the retroperitoneal space. The retroperitoneum was noted to be quite edematous and inflamed. The right retroperitoneum was then opened in similar fashion. The ureter was noted to be on the medial leaf of the broad ligament.  The peritoneum above the ureter was incised. The utero-ovarian ligament was then isolated, cauterized, and transected, freeing the right adnexa from the uterus. The distal fallopian tube segment was elevated and monopolar  electrocautery was used to cauterize along the mesosalpinx until the fallopian tube was freed from underlying ovary. The fallopian tube was handed off the field.    The posterior peritoneum was taken down to the level of the KOH ring on the right.  The round ligament was transected on the right and the anterior peritoneum was opened partially to start the bladder flap with significant adhesions between the bladder and LUS noted.   Attention was then turned posteriorly. With the uterus anteverted, slow blunt and sharp dissection were performed in an effort to free the retroperitonealized adnexa from the sigmoid. Given how densely adherent sigmoid colon was, I became concerned after initial portion of the dissection that continuing could very likely result in injury to the sigmoid colon. With no preoperative bowel prep, I asked Dr. Hassell Done to come in for intraoperative consult. He performed proctoscopy (findings noted above) and agreed that abandoning surgery with plan for returning after bowel prep was safest plan and would, hopefully, avoid need for a colostomy.   Given some oozing from surgical bed along medial adnexal mass and sigmoid, [place] was placed.   Multiple attempts were made to try to identify cystic component of the adnexal mass. This was not successful. Ultimately, a biopsy was performed from anterolateral aspect of the adnexa/mass. Bipolar electrocautery was used to achieve hemostasis.   At this point in the procedure was completed.  Robotic instruments were removed under direct visulaization.  The robot was undocked. The fascia at the 10-12 mm port was closed  with 0 Vicryl on a UR-5 needle.  The subcuticular tissue was closed with 4-0 Vicryl and the skin was closed with 4-0 Monocryl in a subcuticular manner.  Dermabond was applied.    The vagina was swabbed with minimal bleeding noted after manipulator removed.   All sponge, lap and needle counts were correct x  3.   The patient was  transferred to the recovery room in stable condition.  Jeral Pinch, MD

## 2021-01-20 NOTE — Interval H&P Note (Signed)
History and Physical Interval Note:  01/20/2021 9:30 AM  Cyril Mourning Sharilyn Sites  has presented today for surgery, with the diagnosis of COMPLEX PELVIC MASS,ABNORMAL UTERINE BLEEDING.  The various methods of treatment have been discussed with the patient and family. After consideration of risks, benefits and other options for treatment, the patient has consented to  Procedure(s): XI ROBOTIC ASSISTED UNILATERALOOPHORECTOMY (N/A) XI ROBOTIC ASSISTED SALPINGECTOMY (Bilateral) DILATATION & CURETTAGE/HYSTEROSCOPY WITH MYOSURE (N/A) POSSIBLE HERNIA REPAIR UMBILICAL ADULT AND POSSIBLE LAPAROTOMY AND STAGING (N/A) POSSIBLE XI ROBOTIC ASSISTED TOTAL HYSTERECTOMY (N/A) as a surgical intervention.  The patient's history has been reviewed, patient examined, no change in status, stable for surgery.  I have reviewed the patient's chart and labs.  Questions were answered to the patient's satisfaction.     Lafonda Mosses

## 2021-01-21 ENCOUNTER — Encounter (HOSPITAL_COMMUNITY): Payer: Self-pay | Admitting: Gynecologic Oncology

## 2021-01-21 LAB — BASIC METABOLIC PANEL
Anion gap: 4 — ABNORMAL LOW (ref 5–15)
BUN: 9 mg/dL (ref 6–20)
CO2: 26 mmol/L (ref 22–32)
Calcium: 8.2 mg/dL — ABNORMAL LOW (ref 8.9–10.3)
Chloride: 105 mmol/L (ref 98–111)
Creatinine, Ser: 0.77 mg/dL (ref 0.44–1.00)
GFR, Estimated: >60 mL/min (ref >60)
Glucose, Bld: 153 mg/dL — ABNORMAL HIGH (ref 70–99)
Potassium: 4.4 mmol/L (ref 3.5–5.1)
Sodium: 135 mmol/L (ref 135–145)

## 2021-01-21 LAB — CBC
HCT: 38.4 % (ref 36.0–46.0)
Hemoglobin: 12 g/dL (ref 12.0–15.0)
MCH: 33.9 pg (ref 26.0–34.0)
MCHC: 31.3 g/dL (ref 30.0–36.0)
MCV: 108.5 fL — ABNORMAL HIGH (ref 80.0–100.0)
Platelets: 212 10*3/uL (ref 150–400)
RBC: 3.54 MIL/uL — ABNORMAL LOW (ref 3.87–5.11)
RDW: 13 % (ref 11.5–15.5)
WBC: 7.2 10*3/uL (ref 4.0–10.5)
nRBC: 0 % (ref 0.0–0.2)

## 2021-01-21 LAB — SARS CORONAVIRUS 2 BY RT PCR (HOSPITAL ORDER, PERFORMED IN ~~LOC~~ HOSPITAL LAB): SARS Coronavirus 2: NEGATIVE

## 2021-01-21 LAB — URINE CULTURE: Culture: NO GROWTH

## 2021-01-21 MED ORDER — CIPROFLOXACIN HCL 500 MG PO TABS
500.0000 mg | ORAL_TABLET | ORAL | Status: AC
  Administered 2021-01-21: 500 mg via ORAL
  Filled 2021-01-21: qty 1

## 2021-01-21 MED ORDER — METRONIDAZOLE 500 MG PO TABS
500.0000 mg | ORAL_TABLET | Freq: Two times a day (BID) | ORAL | Status: AC
  Administered 2021-01-21 (×2): 500 mg via ORAL
  Filled 2021-01-21: qty 1

## 2021-01-21 MED ORDER — ACETAMINOPHEN 500 MG PO TABS
1000.0000 mg | ORAL_TABLET | Freq: Four times a day (QID) | ORAL | Status: DC | PRN
  Administered 2021-01-23 – 2021-01-26 (×7): 1000 mg via ORAL
  Filled 2021-01-21 (×7): qty 2

## 2021-01-21 MED ORDER — POLYETHYLENE GLYCOL 3350 17 GM/SCOOP PO POWD
1.0000 | Freq: Once | ORAL | Status: AC
  Administered 2021-01-21: 255 g via ORAL
  Filled 2021-01-21: qty 255

## 2021-01-21 MED ORDER — HYDROMORPHONE HCL 1 MG/ML IJ SOLN
0.5000 mg | INTRAMUSCULAR | Status: DC | PRN
  Administered 2021-01-21 – 2021-01-27 (×13): 0.5 mg via INTRAVENOUS
  Filled 2021-01-21 (×15): qty 0.5

## 2021-01-21 MED ORDER — SODIUM CHLORIDE 0.9 % IV SOLN
2.0000 g | INTRAVENOUS | Status: AC
  Administered 2021-01-22 (×2): 2 g via INTRAVENOUS
  Filled 2021-01-21: qty 2

## 2021-01-21 MED ORDER — HEPARIN SODIUM (PORCINE) 5000 UNIT/ML IJ SOLN
5000.0000 [IU] | INTRAMUSCULAR | Status: AC
  Administered 2021-01-22: 5000 [IU] via SUBCUTANEOUS
  Filled 2021-01-21: qty 1

## 2021-01-21 MED ORDER — ENOXAPARIN SODIUM 40 MG/0.4ML IJ SOSY
40.0000 mg | PREFILLED_SYRINGE | Freq: Once | INTRAMUSCULAR | Status: AC
  Administered 2021-01-21: 40 mg via SUBCUTANEOUS
  Filled 2021-01-21: qty 0.4

## 2021-01-21 NOTE — H&P (View-Only) (Signed)
1 Day Post-Op Procedure(s) (LRB): XI ROBOTIC ASSISTED RIGHT  SALPINGECTOMY, LEFT OVARIAN BIOPSY (Right) DILATATION & CURETTAGE/HYSTEROSCOPY WITH MYOSURE (N/A)  Subjective: Patient reports tolerating diet with no nausea or emesis. She states she is watching the clock in regards to asking for pain medication because she doesn't want the pain to become severe. She notices her incisional pain more with movement. Voiding without difficulty. Reporting vaginal spotting. Passing flatus. Did not sleep well due to frequent interruptions. Denies chest pain, dyspnea. All questions answered. Dr. Berline Lopes at the bedside.   Objective: Vital signs in last 24 hours: Temp:  [97.4 F (36.3 C)-98.9 F (37.2 C)] 97.5 F (36.4 C) (11/30 0514) Pulse Rate:  [51-91] 56 (11/30 0514) Resp:  [14-26] 14 (11/30 0514) BP: (103-142)/(74-100) 129/94 (11/30 0514) SpO2:  [90 %-100 %] 98 % (11/30 0514) Weight:  [190 lb 0.6 oz (86.2 kg)] 190 lb 0.6 oz (86.2 kg) (11/29 0801) Last BM Date: 01/20/21  Intake/Output from previous day: 11/29 0701 - 11/30 0700 In: 2440.7 [P.O.:580; I.V.:1860.7] Out: 2350 [Urine:2200; Blood:150]  Physical Examination: General: alert, cooperative, and no distress Resp: clear to auscultation bilaterally Cardio: regular rate and rhythm, S1, S2 normal, no murmur, click, rub or gallop GI: incision: lap sites to the abdomen with dermabond intact without drainage and abdomen obese, active bowel sounds, appropriately tender on palpation Extremities: extremities normal, atraumatic, no cyanosis or edema  Labs: WBC/Hgb/Hct/Plts:  7.2/12.0/38.4/212 (11/30 0540) BUN/Cr/glu/ALT/AST/amyl/lip:  9/0.77/--/--/--/--/-- (11/30 0540)  Assessment: 45 y.o. s/p Procedure(s): XI ROBOTIC ASSISTED RIGHT  SALPINGECTOMY, LEFT OVARIAN BIOPSY DILATATION & CURETTAGE/HYSTEROSCOPY WITH MYOSURE: stable Pain:  Pain is well-controlled on PRN medications.  Heme: Hgb 12.0 and Hct 38.4 this am. Stable compared with preop values  and surgical losses.  ID: No evidence of infection. WBC count 7.2 this am. Receiving oral antibiotics today for bowel prep.  CV: BP and HR stable. Continue to monitor with routine vital signs.  GI:  Tolerating po: Yes. Antiemetics ordered as needed. Performing bowel prep today for upcoming surgery tomorrow with possible sigmoid resection.   GU: Voiding adequate amounts. Creatinine 0.77. Urine sample obtained for culture in the OR on 01/20/2021 pending. Patient has had two recent UTIs.  FEN: No critical values noted.   Prophylaxis: SCDs and lovenox dose ordered this am. To receive heparin injection 120 min before surgery tomorrow.  Plan: -Bowel prep today for surgery tomorrow -Surgery posted as robotic assisted total laparoscopic hysterectomy, unilateral salpingo-oophorectomy, possible sigmoid resection, possible laparotomy. -Continue plan of care per Dr. Berline Lopes  -Encourage IS use, ambulation    LOS: 1 day    Sherrell Farish D Omauri Boeve 01/21/2021, 7:41 AM

## 2021-01-21 NOTE — Anesthesia Preprocedure Evaluation (Addendum)
Anesthesia Evaluation  Patient identified by MRN, date of birth, ID band Patient awake    Reviewed: Allergy & Precautions, NPO status , Patient's Chart, lab work & pertinent test results  History of Anesthesia Complications Negative for: history of anesthetic complications  Airway Mallampati: II  TM Distance: >3 FB Neck ROM: Full    Dental no notable dental hx. (+) Dental Advisory Given,    Pulmonary former smoker,    Pulmonary exam normal        Cardiovascular Exercise Tolerance: Good (-) hypertensionnegative cardio ROS Normal cardiovascular exam     Neuro/Psych negative neurological ROS  negative psych ROS   GI/Hepatic negative GI ROS, Neg liver ROS,   Endo/Other  Hypothyroidism Obesity   Renal/GU negative Renal ROS     Musculoskeletal  (+) Arthritis , Rheumatoid disorders,    Abdominal   Peds  Hematology negative hematology ROS (+)   Anesthesia Other Findings Day of surgery medications reviewed with the patient.  Reproductive/Obstetrics                            Anesthesia Physical  Anesthesia Plan  ASA: 3  Anesthesia Plan: General   Post-op Pain Management: Tylenol PO (pre-op)   Induction: Intravenous  PONV Risk Score and Plan: 4 or greater and Scopolamine patch - Pre-op, Midazolam, Dexamethasone and Ondansetron  Airway Management Planned: Oral ETT  Additional Equipment:   Intra-op Plan:   Post-operative Plan: Extubation in OR  Informed Consent: I have reviewed the patients History and Physical, chart, labs and discussed the procedure including the risks, benefits and alternatives for the proposed anesthesia with the patient or authorized representative who has indicated his/her understanding and acceptance.     Dental advisory given  Plan Discussed with: Anesthesiologist and CRNA  Anesthesia Plan Comments: (2nd PIV after induction )       Anesthesia  Quick Evaluation

## 2021-01-21 NOTE — Progress Notes (Signed)
1 Day Post-Op Procedure(s) (LRB): XI ROBOTIC ASSISTED RIGHT  SALPINGECTOMY, LEFT OVARIAN BIOPSY (Right) DILATATION & CURETTAGE/HYSTEROSCOPY WITH MYOSURE (N/A)  Subjective: Patient reports tolerating diet with no nausea or emesis. She states she is watching the clock in regards to asking for pain medication because she doesn't want the pain to become severe. She notices her incisional pain more with movement. Voiding without difficulty. Reporting vaginal spotting. Passing flatus. Did not sleep well due to frequent interruptions. Denies chest pain, dyspnea. All questions answered. Dr. Berline Lopes at the bedside.   Objective: Vital signs in last 24 hours: Temp:  [97.4 F (36.3 C)-98.9 F (37.2 C)] 97.5 F (36.4 C) (11/30 0514) Pulse Rate:  [51-91] 56 (11/30 0514) Resp:  [14-26] 14 (11/30 0514) BP: (103-142)/(74-100) 129/94 (11/30 0514) SpO2:  [90 %-100 %] 98 % (11/30 0514) Weight:  [190 lb 0.6 oz (86.2 kg)] 190 lb 0.6 oz (86.2 kg) (11/29 0801) Last BM Date: 01/20/21  Intake/Output from previous day: 11/29 0701 - 11/30 0700 In: 2440.7 [P.O.:580; I.V.:1860.7] Out: 2350 [Urine:2200; Blood:150]  Physical Examination: General: alert, cooperative, and no distress Resp: clear to auscultation bilaterally Cardio: regular rate and rhythm, S1, S2 normal, no murmur, click, rub or gallop GI: incision: lap sites to the abdomen with dermabond intact without drainage and abdomen obese, active bowel sounds, appropriately tender on palpation Extremities: extremities normal, atraumatic, no cyanosis or edema  Labs: WBC/Hgb/Hct/Plts:  7.2/12.0/38.4/212 (11/30 0540) BUN/Cr/glu/ALT/AST/amyl/lip:  9/0.77/--/--/--/--/-- (11/30 0540)  Assessment: 45 y.o. s/p Procedure(s): XI ROBOTIC ASSISTED RIGHT  SALPINGECTOMY, LEFT OVARIAN BIOPSY DILATATION & CURETTAGE/HYSTEROSCOPY WITH MYOSURE: stable Pain:  Pain is well-controlled on PRN medications.  Heme: Hgb 12.0 and Hct 38.4 this am. Stable compared with preop values  and surgical losses.  ID: No evidence of infection. WBC count 7.2 this am. Receiving oral antibiotics today for bowel prep.  CV: BP and HR stable. Continue to monitor with routine vital signs.  GI:  Tolerating po: Yes. Antiemetics ordered as needed. Performing bowel prep today for upcoming surgery tomorrow with possible sigmoid resection.   GU: Voiding adequate amounts. Creatinine 0.77. Urine sample obtained for culture in the OR on 01/20/2021 pending. Patient has had two recent UTIs.  FEN: No critical values noted.   Prophylaxis: SCDs and lovenox dose ordered this am. To receive heparin injection 120 min before surgery tomorrow.  Plan: -Bowel prep today for surgery tomorrow -Surgery posted as robotic assisted total laparoscopic hysterectomy, unilateral salpingo-oophorectomy, possible sigmoid resection, possible laparotomy. -Continue plan of care per Dr. Berline Lopes  -Encourage IS use, ambulation    LOS: 1 day    Brittney Herrera 01/21/2021, 7:41 AM

## 2021-01-22 ENCOUNTER — Inpatient Hospital Stay (HOSPITAL_COMMUNITY): Payer: BC Managed Care – PPO

## 2021-01-22 ENCOUNTER — Inpatient Hospital Stay (HOSPITAL_COMMUNITY): Payer: BC Managed Care – PPO | Admitting: Anesthesiology

## 2021-01-22 ENCOUNTER — Encounter (HOSPITAL_COMMUNITY): Payer: Self-pay | Admitting: Obstetrics & Gynecology

## 2021-01-22 ENCOUNTER — Encounter (HOSPITAL_COMMUNITY)
Admission: AD | Disposition: A | Discharge status: Home / Self Care | Payer: Self-pay | Source: Home / Self Care | Attending: Gynecologic Oncology

## 2021-01-22 DIAGNOSIS — R1900 Intra-abdominal and pelvic swelling, mass and lump, unspecified site: Secondary | ICD-10-CM | POA: Diagnosis not present

## 2021-01-22 DIAGNOSIS — N322 Vesical fistula, not elsewhere classified: Secondary | ICD-10-CM

## 2021-01-22 DIAGNOSIS — N3289 Other specified disorders of bladder: Secondary | ICD-10-CM

## 2021-01-22 HISTORY — PX: LAPAROTOMY: SHX154

## 2021-01-22 HISTORY — PX: FLEXIBLE SIGMOIDOSCOPY: SHX5431

## 2021-01-22 HISTORY — PX: CYSTOSCOPY W/ RETROGRADES: SHX1426

## 2021-01-22 LAB — SURGICAL PATHOLOGY

## 2021-01-22 LAB — TYPE AND SCREEN
ABO/RH(D): A POS
Antibody Screen: NEGATIVE

## 2021-01-22 LAB — BASIC METABOLIC PANEL
Anion gap: 8 (ref 5–15)
BUN: 6 mg/dL (ref 6–20)
CO2: 21 mmol/L — ABNORMAL LOW (ref 22–32)
Calcium: 7.8 mg/dL — ABNORMAL LOW (ref 8.9–10.3)
Chloride: 104 mmol/L (ref 98–111)
Creatinine, Ser: 0.8 mg/dL (ref 0.44–1.00)
GFR, Estimated: >60 mL/min (ref >60)
Glucose, Bld: 124 mg/dL — ABNORMAL HIGH (ref 70–99)
Potassium: 4.2 mmol/L (ref 3.5–5.1)
Sodium: 133 mmol/L — ABNORMAL LOW (ref 135–145)

## 2021-01-22 LAB — CBC
HCT: 36.4 % (ref 36.0–46.0)
Hemoglobin: 11.4 g/dL — ABNORMAL LOW (ref 12.0–15.0)
MCH: 33.7 pg (ref 26.0–34.0)
MCHC: 31.3 g/dL (ref 30.0–36.0)
MCV: 107.7 fL — ABNORMAL HIGH (ref 80.0–100.0)
Platelets: 188 10*3/uL (ref 150–400)
RBC: 3.38 MIL/uL — ABNORMAL LOW (ref 3.87–5.11)
RDW: 13.3 % (ref 11.5–15.5)
WBC: 8.3 10*3/uL (ref 4.0–10.5)
nRBC: 0 % (ref 0.0–0.2)

## 2021-01-22 LAB — COMPREHENSIVE METABOLIC PANEL
ALT: 19 U/L (ref 0–44)
AST: 16 U/L (ref 15–41)
Albumin: 2.4 g/dL — ABNORMAL LOW (ref 3.5–5.0)
Alkaline Phosphatase: 59 U/L (ref 38–126)
Anion gap: 4 — ABNORMAL LOW (ref 5–15)
BUN: 6 mg/dL (ref 6–20)
CO2: 18 mmol/L — ABNORMAL LOW (ref 22–32)
Calcium: 6.8 mg/dL — ABNORMAL LOW (ref 8.9–10.3)
Chloride: 105 mmol/L (ref 98–111)
Creatinine, Ser: 0.7 mg/dL (ref 0.44–1.00)
GFR, Estimated: >60 mL/min (ref >60)
Glucose, Bld: 556 mg/dL (ref 70–99)
Potassium: 6.3 mmol/L (ref 3.5–5.1)
Sodium: 127 mmol/L — ABNORMAL LOW (ref 135–145)
Total Bilirubin: 1 mg/dL (ref 0.3–1.2)
Total Protein: 5.4 g/dL — ABNORMAL LOW (ref 6.5–8.1)

## 2021-01-22 LAB — CYTOLOGY - NON PAP

## 2021-01-22 LAB — GLUCOSE, CAPILLARY: Glucose-Capillary: 129 mg/dL — ABNORMAL HIGH (ref 70–99)

## 2021-01-22 SURGERY — LAPAROTOMY, EXPLORATORY
Anesthesia: General

## 2021-01-22 MED ORDER — CHLORHEXIDINE GLUCONATE 0.12 % MT SOLN
15.0000 mL | Freq: Once | OROMUCOSAL | Status: AC
  Administered 2021-01-22: 15 mL via OROMUCOSAL

## 2021-01-22 MED ORDER — ONDANSETRON HCL 4 MG/2ML IJ SOLN
INTRAMUSCULAR | Status: AC
  Filled 2021-01-22: qty 2

## 2021-01-22 MED ORDER — HYDROMORPHONE HCL 1 MG/ML IJ SOLN
INTRAMUSCULAR | Status: DC | PRN
  Administered 2021-01-22 (×5): .4 mg via INTRAVENOUS

## 2021-01-22 MED ORDER — FENTANYL CITRATE (PF) 250 MCG/5ML IJ SOLN
INTRAMUSCULAR | Status: AC
  Filled 2021-01-22: qty 5

## 2021-01-22 MED ORDER — ALBUMIN HUMAN 5 % IV SOLN
INTRAVENOUS | Status: AC
  Filled 2021-01-22: qty 500

## 2021-01-22 MED ORDER — FENTANYL CITRATE (PF) 100 MCG/2ML IJ SOLN
INTRAMUSCULAR | Status: AC
  Filled 2021-01-22: qty 2

## 2021-01-22 MED ORDER — LIDOCAINE HCL (PF) 2 % IJ SOLN
INTRAMUSCULAR | Status: AC
  Filled 2021-01-22: qty 5

## 2021-01-22 MED ORDER — INDIGOTINDISULFONATE SODIUM 8 MG/ML IJ SOLN
INTRAMUSCULAR | Status: DC | PRN
  Administered 2021-01-22: 5 mL via INTRAVENOUS

## 2021-01-22 MED ORDER — NON FORMULARY: 1.0000 [IU] | Freq: Three times a day (TID) | Status: DC

## 2021-01-22 MED ORDER — ONDANSETRON HCL 4 MG/2ML IJ SOLN
INTRAMUSCULAR | Status: DC | PRN
  Administered 2021-01-22: 4 mg via INTRAVENOUS

## 2021-01-22 MED ORDER — FENTANYL CITRATE PF 50 MCG/ML IJ SOSY
PREFILLED_SYRINGE | INTRAMUSCULAR | Status: AC
  Administered 2021-01-22: 25 ug via INTRAVENOUS
  Filled 2021-01-22: qty 3

## 2021-01-22 MED ORDER — PHENYLEPHRINE 40 MCG/ML (10ML) SYRINGE FOR IV PUSH (FOR BLOOD PRESSURE SUPPORT)
PREFILLED_SYRINGE | INTRAVENOUS | Status: AC
  Filled 2021-01-22: qty 10

## 2021-01-22 MED ORDER — SUCCINYLCHOLINE CHLORIDE 200 MG/10ML IV SOSY
PREFILLED_SYRINGE | INTRAVENOUS | Status: AC
  Filled 2021-01-22: qty 10

## 2021-01-22 MED ORDER — SCOPOLAMINE 1 MG/3DAYS TD PT72
1.0000 | MEDICATED_PATCH | TRANSDERMAL | Status: DC
  Filled 2021-01-22 (×2): qty 1

## 2021-01-22 MED ORDER — FENTANYL CITRATE PF 50 MCG/ML IJ SOSY
25.0000 ug | PREFILLED_SYRINGE | INTRAMUSCULAR | Status: DC | PRN
  Administered 2021-01-22: 25 ug via INTRAVENOUS
  Administered 2021-01-22: 50 ug via INTRAVENOUS
  Administered 2021-01-22 (×2): 25 ug via INTRAVENOUS

## 2021-01-22 MED ORDER — PREGABALIN 75 MG PO CAPS
75.0000 mg | ORAL_CAPSULE | Freq: Two times a day (BID) | ORAL | Status: DC
  Administered 2021-01-23 – 2021-01-28 (×10): 75 mg via ORAL
  Filled 2021-01-22 (×10): qty 1

## 2021-01-22 MED ORDER — SODIUM CHLORIDE 0.9 % IV SOLN
2.0000 g | Freq: Four times a day (QID) | INTRAVENOUS | Status: AC
  Administered 2021-01-22 – 2021-01-23 (×4): 2 g via INTRAVENOUS
  Filled 2021-01-22 (×4): qty 2

## 2021-01-22 MED ORDER — METRONIDAZOLE 500 MG/100ML IV SOLN
INTRAVENOUS | Status: AC
  Filled 2021-01-22: qty 100

## 2021-01-22 MED ORDER — BUPIVACAINE HCL 0.25 % IJ SOLN
INTRAMUSCULAR | Status: DC | PRN
  Administered 2021-01-22: 20 mL

## 2021-01-22 MED ORDER — PHENAZOPYRIDINE HCL 100 MG PO TABS
100.0000 mg | ORAL_TABLET | Freq: Three times a day (TID) | ORAL | Status: AC
  Administered 2021-01-22 – 2021-01-24 (×6): 100 mg via ORAL
  Filled 2021-01-22 (×6): qty 1

## 2021-01-22 MED ORDER — SODIUM CHLORIDE (PF) 0.9 % IJ SOLN
INTRAMUSCULAR | Status: AC
  Filled 2021-01-22: qty 20

## 2021-01-22 MED ORDER — 0.9 % SODIUM CHLORIDE (POUR BTL) OPTIME
TOPICAL | Status: DC | PRN
  Administered 2021-01-22: 3000 mL

## 2021-01-22 MED ORDER — KETAMINE HCL 10 MG/ML IJ SOLN
INTRAMUSCULAR | Status: DC | PRN
  Administered 2021-01-22: 30 mg via INTRAVENOUS
  Administered 2021-01-22 (×2): 10 mg via INTRAVENOUS

## 2021-01-22 MED ORDER — MIDAZOLAM HCL 2 MG/2ML IJ SOLN
INTRAMUSCULAR | Status: AC
  Filled 2021-01-22: qty 2

## 2021-01-22 MED ORDER — METRONIDAZOLE 500 MG/100ML IV SOLN
500.0000 mg | Freq: Once | INTRAVENOUS | Status: AC
  Administered 2021-01-22: 500 mg via INTRAVENOUS
  Filled 2021-01-22: qty 100

## 2021-01-22 MED ORDER — AMISULPRIDE (ANTIEMETIC) 5 MG/2ML IV SOLN: 10.0000 mg | Freq: Once | INTRAVENOUS | Status: DC | PRN

## 2021-01-22 MED ORDER — METHYLENE BLUE 0.5 % INJ SOLN
INTRAVENOUS | Status: AC
  Filled 2021-01-22: qty 10

## 2021-01-22 MED ORDER — PROPOFOL 10 MG/ML IV BOLUS
INTRAVENOUS | Status: AC
  Filled 2021-01-22: qty 20

## 2021-01-22 MED ORDER — ALBUMIN HUMAN 5 % IV SOLN: INTRAVENOUS | Status: DC | PRN

## 2021-01-22 MED ORDER — DEXAMETHASONE SODIUM PHOSPHATE 10 MG/ML IJ SOLN
INTRAMUSCULAR | Status: DC | PRN
  Administered 2021-01-22: 10 mg via INTRAVENOUS

## 2021-01-22 MED ORDER — ACETAMINOPHEN 500 MG PO TABS
1000.0000 mg | ORAL_TABLET | Freq: Once | ORAL | Status: AC
  Administered 2021-01-22: 1000 mg via ORAL
  Filled 2021-01-22: qty 2

## 2021-01-22 MED ORDER — KETAMINE HCL 10 MG/ML IJ SOLN
INTRAMUSCULAR | Status: AC
  Filled 2021-01-22: qty 1

## 2021-01-22 MED ORDER — CHEWING GUM (ORBIT) SUGAR FREE
1.0000 | CHEWING_GUM | Freq: Three times a day (TID) | ORAL | Status: DC
  Administered 2021-01-23 – 2021-01-28 (×17): 1 via ORAL
  Filled 2021-01-22 (×2): qty 1

## 2021-01-22 MED ORDER — PROMETHAZINE HCL 25 MG/ML IJ SOLN: 6.2500 mg | INTRAMUSCULAR | Status: DC | PRN

## 2021-01-22 MED ORDER — BUPIVACAINE LIPOSOME 1.3 % IJ SUSP
INTRAMUSCULAR | Status: DC | PRN
  Administered 2021-01-22: 20 mL

## 2021-01-22 MED ORDER — BUPIVACAINE HCL 0.25 % IJ SOLN
INTRAMUSCULAR | Status: AC
  Filled 2021-01-22: qty 1

## 2021-01-22 MED ORDER — LACTATED RINGERS IV SOLN: INTRAVENOUS | Status: DC

## 2021-01-22 MED ORDER — SUGAMMADEX SODIUM 200 MG/2ML IV SOLN
INTRAVENOUS | Status: DC | PRN
  Administered 2021-01-22: 200 mg via INTRAVENOUS

## 2021-01-22 MED ORDER — LACTATED RINGERS IV SOLN: INTRAVENOUS | Status: DC | PRN

## 2021-01-22 MED ORDER — ROCURONIUM BROMIDE 10 MG/ML (PF) SYRINGE
PREFILLED_SYRINGE | INTRAVENOUS | Status: DC | PRN
  Administered 2021-01-22: 20 mg via INTRAVENOUS
  Administered 2021-01-22 (×2): 10 mg via INTRAVENOUS
  Administered 2021-01-22: 90 mg via INTRAVENOUS

## 2021-01-22 MED ORDER — ROCURONIUM BROMIDE 10 MG/ML (PF) SYRINGE
PREFILLED_SYRINGE | INTRAVENOUS | Status: AC
  Filled 2021-01-22: qty 10

## 2021-01-22 MED ORDER — CHLORHEXIDINE GLUCONATE CLOTH 2 % EX PADS
6.0000 | MEDICATED_PAD | Freq: Every day | CUTANEOUS | Status: DC
  Administered 2021-01-23 – 2021-01-28 (×6): 6 via TOPICAL

## 2021-01-22 MED ORDER — PHENYLEPHRINE 40 MCG/ML (10ML) SYRINGE FOR IV PUSH (FOR BLOOD PRESSURE SUPPORT)
PREFILLED_SYRINGE | INTRAVENOUS | Status: DC | PRN
  Administered 2021-01-22: 120 ug via INTRAVENOUS
  Administered 2021-01-22 (×2): 80 ug via INTRAVENOUS
  Administered 2021-01-22: 120 ug via INTRAVENOUS

## 2021-01-22 MED ORDER — CELECOXIB 200 MG PO CAPS
200.0000 mg | ORAL_CAPSULE | Freq: Once | ORAL | Status: AC
  Administered 2021-01-22: 200 mg via ORAL
  Filled 2021-01-22: qty 1

## 2021-01-22 MED ORDER — BUPIVACAINE LIPOSOME 1.3 % IJ SUSP
INTRAMUSCULAR | Status: AC
  Filled 2021-01-22: qty 20

## 2021-01-22 MED ORDER — METRONIDAZOLE 500 MG/100ML IV SOLN
500.0000 mg | Freq: Two times a day (BID) | INTRAVENOUS | Status: AC
  Administered 2021-01-22 – 2021-01-23 (×2): 500 mg via INTRAVENOUS
  Filled 2021-01-22 (×2): qty 100

## 2021-01-22 MED ORDER — DEXAMETHASONE SODIUM PHOSPHATE 10 MG/ML IJ SOLN
INTRAMUSCULAR | Status: AC
  Filled 2021-01-22: qty 1

## 2021-01-22 MED ORDER — SODIUM CHLORIDE (PF) 0.9 % IJ SOLN
INTRAMUSCULAR | Status: DC | PRN
  Administered 2021-01-22: 20 mL

## 2021-01-22 MED ORDER — MIDAZOLAM HCL 2 MG/2ML IJ SOLN
INTRAMUSCULAR | Status: DC | PRN
  Administered 2021-01-22: 2 mg via INTRAVENOUS

## 2021-01-22 MED ORDER — LIDOCAINE 2% (20 MG/ML) 5 ML SYRINGE
INTRAMUSCULAR | Status: DC | PRN
  Administered 2021-01-22: 100 mg via INTRAVENOUS

## 2021-01-22 MED ORDER — INDIGOTINDISULFONATE SODIUM 8 MG/ML IJ SOLN
INTRAMUSCULAR | Status: AC
  Filled 2021-01-22: qty 5

## 2021-01-22 MED ORDER — SODIUM CHLORIDE 0.9 % IV SOLN
2.0000 g | INTRAVENOUS | Status: DC
  Filled 2021-01-22: qty 2

## 2021-01-22 MED ORDER — IOHEXOL 300 MG/ML  SOLN
INTRAMUSCULAR | Status: DC | PRN
  Administered 2021-01-22: 10 mL

## 2021-01-22 MED ORDER — PROPOFOL 10 MG/ML IV BOLUS
INTRAVENOUS | Status: DC | PRN
  Administered 2021-01-22: 130 mg via INTRAVENOUS

## 2021-01-22 MED ORDER — FENTANYL CITRATE (PF) 250 MCG/5ML IJ SOLN
INTRAMUSCULAR | Status: DC | PRN
  Administered 2021-01-22 (×6): 50 ug via INTRAVENOUS
  Administered 2021-01-22: 100 ug via INTRAVENOUS
  Administered 2021-01-22: 50 ug via INTRAVENOUS

## 2021-01-22 SURGICAL SUPPLY — 103 items
APPLICATOR SURGIFLO ENDO (HEMOSTASIS) IMPLANT
BAG COUNTER SPONGE SURGICOUNT (BAG) IMPLANT
BAG LAPAROSCOPIC 12 15 PORT 16 (BASKET) IMPLANT
BAG RETRIEVAL 12/15 (BASKET)
BAG URINE DRAIN 2000ML AR STRL (UROLOGICAL SUPPLIES) ×1 IMPLANT
BLADE SURG SZ10 CARB STEEL (BLADE) IMPLANT
CATH FOLEY 2WAY SLVR  5CC 20FR (CATHETERS) ×1
CATH FOLEY 2WAY SLVR 5CC 20FR (CATHETERS) IMPLANT
CATH URET 5FR 28IN CONE TIP (BALLOONS) ×1
CATH URET 5FR 70CM CONE TIP (BALLOONS) IMPLANT
CNTNR URN SCR LID CUP LEK RST (MISCELLANEOUS) IMPLANT
CONT SPEC 4OZ STRL OR WHT (MISCELLANEOUS) ×1
COVER BACK TABLE 60X90IN (DRAPES) ×3 IMPLANT
COVER TIP SHEARS 8 DVNC (MISCELLANEOUS) ×2 IMPLANT
COVER TIP SHEARS 8MM DA VINCI (MISCELLANEOUS) ×1
DECANTER SPIKE VIAL GLASS SM (MISCELLANEOUS) IMPLANT
DERMABOND ADVANCED (GAUZE/BANDAGES/DRESSINGS) ×2
DERMABOND ADVANCED .7 DNX12 (GAUZE/BANDAGES/DRESSINGS) ×2 IMPLANT
DRAIN CHANNEL RND F F (WOUND CARE) ×2 IMPLANT
DRAPE ARM DVNC X/XI (DISPOSABLE) ×8 IMPLANT
DRAPE C-ARM 42X120 X-RAY (DRAPES) ×1 IMPLANT
DRAPE COLUMN DVNC XI (DISPOSABLE) ×2 IMPLANT
DRAPE DA VINCI XI ARM (DISPOSABLE) ×4
DRAPE DA VINCI XI COLUMN (DISPOSABLE) ×1
DRAPE SHEET LG 3/4 BI-LAMINATE (DRAPES) ×4 IMPLANT
DRAPE SURG IRRIG POUCH 19X23 (DRAPES) ×3 IMPLANT
DRAPE WARM FLUID 44X44 (DRAPES) ×1 IMPLANT
DRSG OPSITE POSTOP 4X10 (GAUZE/BANDAGES/DRESSINGS) IMPLANT
DRSG OPSITE POSTOP 4X12 (GAUZE/BANDAGES/DRESSINGS) ×1 IMPLANT
DRSG OPSITE POSTOP 4X6 (GAUZE/BANDAGES/DRESSINGS) IMPLANT
DRSG OPSITE POSTOP 4X8 (GAUZE/BANDAGES/DRESSINGS) IMPLANT
DRSG TEGADERM 4X4.75 (GAUZE/BANDAGES/DRESSINGS) ×2 IMPLANT
ELECT PENCIL ROCKER SW 15FT (MISCELLANEOUS) IMPLANT
ELECT REM PT RETURN 15FT ADLT (MISCELLANEOUS) ×3 IMPLANT
EVACUATOR SILICONE 100CC (DRAIN) ×2 IMPLANT
GAUZE 4X4 16PLY ~~LOC~~+RFID DBL (SPONGE) ×3 IMPLANT
GAUZE SPONGE 2X2 8PLY STRL LF (GAUZE/BANDAGES/DRESSINGS) IMPLANT
GLOVE SURG ENC MOIS LTX SZ6 (GLOVE) ×12 IMPLANT
GLOVE SURG ENC MOIS LTX SZ6.5 (GLOVE) ×6 IMPLANT
GOWN STRL REUS W/ TWL LRG LVL3 (GOWN DISPOSABLE) ×8 IMPLANT
GOWN STRL REUS W/TWL LRG LVL3 (GOWN DISPOSABLE) ×6
GUIDEWIRE STR DUAL SENSOR (WIRE) ×1 IMPLANT
HOLDER FOLEY CATH W/STRAP (MISCELLANEOUS) ×3 IMPLANT
IRRIG SUCT STRYKERFLOW 2 WTIP (MISCELLANEOUS) ×3
IRRIGATION SUCT STRKRFLW 2 WTP (MISCELLANEOUS) ×2 IMPLANT
KIT PROCEDURE DA VINCI SI (MISCELLANEOUS)
KIT PROCEDURE DVNC SI (MISCELLANEOUS) IMPLANT
KIT TURNOVER KIT A (KITS) IMPLANT
MANIPULATOR ADVINCU DEL 3.0 PL (MISCELLANEOUS) IMPLANT
MANIPULATOR UTERINE 4.5 ZUMI (MISCELLANEOUS) ×3 IMPLANT
NDL HYPO 21X1.5 SAFETY (NEEDLE) ×2 IMPLANT
NDL SPNL 18GX3.5 QUINCKE PK (NEEDLE) IMPLANT
NEEDLE HYPO 21X1.5 SAFETY (NEEDLE) ×3 IMPLANT
NEEDLE SPNL 18GX3.5 QUINCKE PK (NEEDLE) IMPLANT
OBTURATOR OPTICAL STANDARD 8MM (TROCAR) ×1
OBTURATOR OPTICAL STND 8 DVNC (TROCAR) ×2
OBTURATOR OPTICALSTD 8 DVNC (TROCAR) ×2 IMPLANT
PACK ROBOT GYN CUSTOM WL (TRAY / TRAY PROCEDURE) ×3 IMPLANT
PAD POSITIONING PINK XL (MISCELLANEOUS) ×3 IMPLANT
PORT ACCESS TROCAR AIRSEAL 12 (TROCAR) ×2 IMPLANT
PORT ACCESS TROCAR AIRSEAL 5M (TROCAR) ×1
POUCH SPECIMEN RETRIEVAL 10MM (ENDOMECHANICALS) IMPLANT
RETRACTOR WND ALEXIS 25 LRG (MISCELLANEOUS) IMPLANT
RTRCTR WOUND ALEXIS 25CM LRG (MISCELLANEOUS) ×3
SCRUB EXIDINE 4% CHG 4OZ (MISCELLANEOUS) ×3 IMPLANT
SEAL CANN UNIV 5-8 DVNC XI (MISCELLANEOUS) ×8 IMPLANT
SEAL XI 5MM-8MM UNIVERSAL (MISCELLANEOUS) ×4
SET IRRIG Y TYPE TUR BLADDER L (SET/KITS/TRAYS/PACK) ×1 IMPLANT
SET TRI-LUMEN FLTR TB AIRSEAL (TUBING) ×3 IMPLANT
SOL ANTI FOG 6CC (MISCELLANEOUS) IMPLANT
SOLUTION ANTI FOG 6CC (MISCELLANEOUS) ×1
SPONGE GAUZE 2X2 STER 10/PKG (GAUZE/BANDAGES/DRESSINGS) ×1
SPONGE T-LAP 18X18 ~~LOC~~+RFID (SPONGE) IMPLANT
STAPLER INSORB 30 2030 C-SECTI (MISCELLANEOUS) ×1 IMPLANT
STAPLER VISISTAT (STAPLE) ×1 IMPLANT
SURGIFLO W/THROMBIN 8M KIT (HEMOSTASIS) IMPLANT
SUT MNCRL AB 4-0 PS2 18 (SUTURE) IMPLANT
SUT PDS AB 1 TP1 96 (SUTURE) ×2 IMPLANT
SUT SILK 2 0 (SUTURE) ×1
SUT SILK 2-0 18XBRD TIE 12 (SUTURE) IMPLANT
SUT SILK 3 0 (SUTURE) ×1
SUT SILK 3 0 SH CR/8 (SUTURE) ×1 IMPLANT
SUT SILK 3-0 18XBRD TIE 12 (SUTURE) IMPLANT
SUT VIC AB 0 CT1 27 (SUTURE)
SUT VIC AB 0 CT1 27XBRD ANTBC (SUTURE) IMPLANT
SUT VIC AB 2-0 CT1 27 (SUTURE)
SUT VIC AB 2-0 CT1 TAPERPNT 27 (SUTURE) IMPLANT
SUT VIC AB 2-0 SH 27 (SUTURE) ×2
SUT VIC AB 2-0 SH 27X BRD (SUTURE) IMPLANT
SUT VIC AB 4-0 PS2 18 (SUTURE) ×6 IMPLANT
SUT VICRYL 0 UR6 27IN ABS (SUTURE) ×1 IMPLANT
SYR 10ML LL (SYRINGE) ×1 IMPLANT
SYR 20ML LL LF (SYRINGE) ×1 IMPLANT
SYR BULB IRRIG 60ML STRL (SYRINGE) ×1 IMPLANT
SYR CONTROL 10ML LL (SYRINGE) ×1 IMPLANT
TOWEL OR NON WOVEN STRL DISP B (DISPOSABLE) ×3 IMPLANT
TRAP SPECIMEN MUCUS 40CC (MISCELLANEOUS) IMPLANT
TRAY FOLEY MTR SLVR 16FR STAT (SET/KITS/TRAYS/PACK) ×3 IMPLANT
TROCAR XCEL NON-BLD 5MMX100MML (ENDOMECHANICALS) IMPLANT
TUBING ENDO SMARTCAP (MISCELLANEOUS) ×1 IMPLANT
UNDERPAD 30X36 HEAVY ABSORB (UNDERPADS AND DIAPERS) ×6 IMPLANT
WATER STERILE IRR 1000ML POUR (IV SOLUTION) ×3 IMPLANT
YANKAUER SUCT BULB TIP 10FT TU (MISCELLANEOUS) IMPLANT

## 2021-01-22 NOTE — Op Note (Signed)
01/22/2021  3:52 PM  PATIENT:  Brittney Herrera  45 y.o. female  Patient Care Team: Everardo Beals, NP as PCP - General Dr. Lahoma Rocker (Rheumatology)  PRE-OPERATIVE DIAGNOSIS:  Complex adnexal mass  POST-OPERATIVE DIAGNOSIS:  Complex adnexal mass  PROCEDURE:   Diagnostic flexible sigmoidoscopy - to confirm integrity of rectum and sigmoid colon Exploratory laparotomy (co-surgeon with Dr. Berline Lopes) Repair of small bowel mesentery  SURGEON:  Nadeen Landau MD  ASSISTANT: Jeral Pinch MD  ANESTHESIA:   general  EBL: 10 mL from my portion of the procedure  DRAINS: Dr. Berline Lopes left drains at conclusion of the procedure  SPECIMEN: None  FINDINGS: Fibrinous exudate coating the surface of the small bowel that had been within the pelvis.  Normal gastric wall.  Normal-appearing gallbladder.  Normal-appearing liver.  Small bowel was run twice from the ligament of Treitz to the ileocecal valve.  There is no evident injury to the small bowel.  During this process, due to the fragile nature of the tissues related to her inflammatory response, there was a small mesenteric tear that was noted in the distal small bowel mesentery.  This was repaired.  Appendix normal.  The cecum, ascending colon, transverse colon, descending colon just distal to the splenic flexure, and sigmoid colon are all grossly normal without any apparent injury.  Diagnostic flexible sigmoidoscopy was carried out to perform both a leak test and check the integrity of the rectum and sigmoid colon.  There is no apparent endoluminal pathology or evident injury.  DISPOSITION: PACU in satisfactory condition  DESCRIPTION: The patient was undergoing surgery with Dr. Berline Lopes. Significant inflammatory adhesions with inflammatory rind were found in the pelvis and lower quadrants. The decision had been made to convert to open. I scrubbed in.  A Bookwalter was placed.  The low midline incision already been created.  The small  bowel was eviscerated.  Clear ascities without any odor is encountered. There is no succus or evident stool in the abdomen or pelvis. We then began running the small bowel from the ligament of Treitz to the ileocecal valve.  We did this twice.  There was some inflammatory rind on multiple serosal surfaces of the small bowel but there is no apparent injury to the small bowel.  There was a small mesenteric rent in the small bowel mesentery that was repaired with 3-0 silk sutures. Vascular integrity to the bowel is not disturbed.  The appendix is normal in appearance.  The cecum and ascending colon are normal in appearance.  The hepatic flexure was partially mobilized to facilitate evaluation.  There is no apparent injury to the colon this level.  The transverse colon was normal.  The mid and distal descending colon are normal.  The splenic flexure is deep within her abdomen and unable to be fully visualized but well away from the prior surgical ports.  The descending colon and sigmoid colon were without any apparent injury.  The sigmoid colon does appear to be densely involved with the adnexal process within the pelvis.  I do not see any evident injury to the colon.  The colon is occluded well outside the pelvis and I went below to pass the flexible sigmoidoscope.  With the colon under water, the flexible sigmoidoscope was introduced through the anal canal and into the rectum.  Thin yellow stool was evacuated.  We are able to advance all the way up into the midportion of the sigmoid colon and the lumen is grossly normal in appearance.  With  the colon well distended and under water, there are no extravasation of air bubbles.  I slowly withdraw the scope, circumferentially evaluating the integrity of the wall.  There is no mucosal defects or contusions to suggest any potential injury to the colon.  The colon was decompressed and the scope was removed.  I scrubbed back in to assist. There was copious amounts of clear  ascites. The bladder was back filled with dilute methylene blue and no clear extravasation was seen. Dr. 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 We were not able to identify any apparent integrity issues to the GI tract. We suspect that the fistula communicated with the adnexa and during prior procedure with instrumentation of the adnexa, partial removal and hysteroscopy resulted in intraperitoneal communication/fluid in the abdomen.  Drains were left by Dr. Berline Lopes. Full dictation for their portions of the procedure dictated separately.  Final counts are reported correct

## 2021-01-22 NOTE — Transfer of Care (Signed)
Immediate Anesthesia Transfer of Care Note  Patient: Phillipa Morden  Procedure(s) Performed: EXPLORATORY LAPAROTOMY CYSTOSCOPY WITH RETROGRADE PYELOGRAM;FISTULOGRAM DIAGNOSTIC FLEXIBLE SIGMOIDOSCOPY EXPLORATORY LAPAROTOMY  Patient Location: PACU  Anesthesia Type:General  Level of Consciousness: awake  Airway & Oxygen Therapy: Patient Spontanous Breathing and Patient connected to face mask oxygen  Post-op Assessment: Report given to RN and Post -op Vital signs reviewed and stable  Post vital signs: Reviewed and stable  Last Vitals:  Vitals Value Taken Time  BP 118/81 01/22/21 1552  Temp    Pulse 93 01/22/21 1555  Resp 22 01/22/21 1555  SpO2 93 % 01/22/21 1555  Vitals shown include unvalidated device data.  Last Pain:  Vitals:   01/22/21 1045  TempSrc: Oral  PainSc: 4       Patients Stated Pain Goal: 2 (41/59/73 3125)  Complications: No notable events documented.

## 2021-01-22 NOTE — Anesthesia Procedure Notes (Addendum)
Procedure Name: Intubation Date/Time: 01/22/2021 11:46 AM Performed by: Sharlette Dense, CRNA Pre-anesthesia Checklist: Patient identified, Emergency Drugs available, Suction available and Patient being monitored Patient Re-evaluated:Patient Re-evaluated prior to induction Oxygen Delivery Method: Circle system utilized Preoxygenation: Pre-oxygenation with 100% oxygen Induction Type: IV induction Ventilation: Mask ventilation without difficulty and Oral airway inserted - appropriate to patient size Laryngoscope Size: Glidescope and 3 Grade View: Grade I Tube type: Parker flex tip Tube size: 7.5 mm Number of attempts: 1 Airway Equipment and Method: Stylet and Oral airway Placement Confirmation: ETT inserted through vocal cords under direct vision, positive ETCO2 and breath sounds checked- equal and bilateral Secured at: 22 cm Tube secured with: Tape Dental Injury: Teeth and Oropharynx as per pre-operative assessment  Difficulty Due To: Difficulty was anticipated, Difficult Airway- due to anterior larynx, Difficult Airway- due to limited oral opening and Difficult Airway- due to dentition

## 2021-01-22 NOTE — Interval H&P Note (Signed)
History and Physical Interval Note: Plan for completion surgery today. Patient is s/p bowel prep in anticipation of possible necessary enterotomy or colon resection due to adhesive adnexal mass.  01/22/2021 7:23 AM  Brittney Herrera  has presented today for surgery, with the diagnosis of COMPLEX PELVIC MASS, ABNORMAL UTERINE BLEEDING.  The various methods of treatment have been discussed with the patient and family. After consideration of risks, benefits and other options for treatment, the patient has consented to  Procedure(s): XI ROBOTIC ASSISTED TOTAL HYSTERECTOMY WITH UNILATERAL SALPINGO OOPHORECTOMY (N/A) COLON RESECTION SIGMOID (N/A) POSSIBLE LAPAROTOMY WITH STAGING (N/A) as a surgical intervention.  The patient's history has been reviewed, patient examined, no change in status, stable for surgery.  I have reviewed the patient's chart and labs.  Questions were answered to the patient's satisfaction.     Lafonda Mosses

## 2021-01-22 NOTE — Progress Notes (Signed)
Day of Surgery Procedure(s) (LRB): XI ROBOTIC ASSISTED TOTAL HYSTERECTOMY WITH UNILATERAL SALPINGO OOPHORECTOMY (N/A) COLON RESECTION SIGMOID (N/A) POSSIBLE LAPAROTOMY WITH STAGING (N/A)  Subjective: Patient reports significant pain overnight, cramping in nature. Has pain when moves. Had some emesis yesterday while doing bowel prep after taking Tylenol. Stools are liquid. + flatus. Increased vaginal bleeding overnight.  Objective: Vital signs in last 24 hours: Temp:  [97.5 F (36.4 C)-99.3 F (37.4 C)] 99.3 F (37.4 C) (12/01 1045) Pulse Rate:  [73-119] 119 (12/01 1045) Resp:  [16-20] 20 (12/01 1045) BP: (110-172)/(76-113) 120/78 (12/01 1045) SpO2:  [96 %-99 %] 96 % (12/01 1045) Weight:  [189 lb 9.5 oz (86 kg)] 189 lb 9.5 oz (86 kg) (12/01 1045) Last BM Date: 01/20/21  Intake/Output from previous day: 11/30 0701 - 12/01 0700 In: 4299.7 [P.O.:330; I.V.:3969.7] Out: 800 [Emesis/NG output:800]  Physical Examination: Gen: appears uncomfortable, NAD CV: tachycardic, regular rhythm, no murmurs Pulm: clear to ausculation bilaterally, no wheezes Abd: moderately distended, diffuse tender to palpation, + guarding, + bowel sounds Ext: warm and well perfused, no edema  Labs: CBC    Component Value Date/Time   WBC 8.3 01/22/2021 0412   RBC 3.38 (L) 01/22/2021 0412   HGB 11.4 (L) 01/22/2021 0412   HCT 36.4 01/22/2021 0412   PLT 188 01/22/2021 0412   MCV 107.7 (H) 01/22/2021 0412   MCH 33.7 01/22/2021 0412   MCHC 31.3 01/22/2021 0412   RDW 13.3 01/22/2021 0412   BMP Latest Ref Rng & Units 01/22/2021 01/22/2021 01/21/2021  Glucose 70 - 99 mg/dL 124(H) 556(HH) 153(H)  BUN 6 - 20 mg/dL 6 6 9   Creatinine 0.44 - 1.00 mg/dL 0.80 0.70 0.77  Sodium 135 - 145 mmol/L 133(L) 127(L) 135  Potassium 3.5 - 5.1 mmol/L 4.2 6.3(HH) 4.4  Chloride 98 - 111 mmol/L 104 105 105  CO2 22 - 32 mmol/L 21(L) 18(L) 26  Calcium 8.9 - 10.3 mg/dL 7.8(L) 6.8(L) 8.2(L)    Assessment/Plan:  44 y.o. s/p  Procedure(s): XI ROBOTIC ASSISTED TOTAL HYSTERECTOMY WITH UNILATERAL SALPINGO OOPHORECTOMY COLON RESECTION SIGMOID POSSIBLE LAPAROTOMY WITH STAGING:   Increased pain and tachycardia. Suspect due to post-op pain and dehydration after bowel prep although bowel injury on differential (no evidence of that during recent surgery on Tuesday). Plan is to return to the OR today for completion surgery with bowel prep given anticipated need for bowel resection vs. Intentional bowel injury to remove left adnexa.   LOS: 2 days    Lafonda Mosses 01/22/2021, 11:08 AM

## 2021-01-22 NOTE — Brief Op Note (Signed)
01/22/2021  3:49 PM  PATIENT:  Brittney Herrera  45 y.o. female  PRE-OPERATIVE DIAGNOSIS:  COMPLEX PELVIC MASS, ABNORMAL UTERINE BLEEDING  POST-OPERATIVE DIAGNOSIS:  COMPLEX PELVIC MASS, ABNORMAL UTERINE BLEEDING  PROCEDURE:  Procedure(s): EXPLORATORY LAPAROTOMY (N/A) CYSTOSCOPY WITH RETROGRADE PYELOGRAM;FISTULOGRAM DIAGNOSTIC FLEXIBLE SIGMOIDOSCOPY (N/A) EXPLORATORY LAPAROTOMY (N/A)  SURGEON:  Surgeon(s) and Role: Panel 1:    Lafonda Mosses, MD - Primary    * Lahoma Crocker, MD - Assisting Panel 2:    * Remi Haggard, MD - Primary Panel 3:    * Ileana Roup, MD - Primary  ANESTHESIA:   general  EBL:  150 mL   BLOOD ADMINISTERED:none  DRAINS: (89F) Jackson-Pratt drain(s) with closed bulb suction in the pelvis x2.  LOCAL MEDICATIONS USED:  Exparel  SPECIMEN:  No Specimen  DISPOSITION OF SPECIMEN:  N/A  COUNTS:  YES  TOURNIQUET:  * No tourniquets in log *  DICTATION: .Note written in EPIC  PLAN OF CARE: Admit to inpatient   PATIENT DISPOSITION:  PACU - hemodynamically stable.   Delay start of Pharmacological VTE agent (>24hrs) due to surgical blood loss or risk of bleeding: no

## 2021-01-22 NOTE — Progress Notes (Signed)
Day of Surgery Procedure(s) (LRB): XI ROBOTIC ASSISTED TOTAL HYSTERECTOMY WITH UNILATERAL SALPINGO OOPHORECTOMY (N/A) COLON RESECTION SIGMOID (N/A) POSSIBLE LAPAROTOMY WITH STAGING (N/A)  Subjective: Patient reports having a rough night due to severe abdominal pain and cramping from the bowel prep. She states the abdominal pain feels like hot knifes in her abdomen. It worsens with movement. Had an episode of emesis yesterday afternoon midway through the bowel prep. States she had two large bowel movements after starting the prep and has been passing a large amount of flatus. Denies chest pain, dyspnea. Reports incontinence of urine intermittently. Dr. Berline Lopes and Dr. Dema Severin at the bedside.   Objective: Vital signs in last 24 hours: Temp:  [97.5 F (36.4 C)-99.3 F (37.4 C)] 99.3 F (37.4 C) (12/01 1045) Pulse Rate:  [73-119] 119 (12/01 1045) Resp:  [16-20] 20 (12/01 1045) BP: (110-172)/(76-113) 120/78 (12/01 1045) SpO2:  [96 %-99 %] 96 % (12/01 1045) Weight:  [189 lb 9.5 oz (86 kg)] 189 lb 9.5 oz (86 kg) (12/01 1045) Last BM Date: 01/20/21  Intake/Output from previous day: 11/30 0701 - 12/01 0700 In: 4299.7 [P.O.:330; I.V.:3969.7] Out: 800 [Emesis/NG output:800]  Physical Examination: General: alert, cooperative, no distress, and intermittent grimacing due to abdominal cramping Resp: clear to auscultation bilaterally Cardio: Tachycardic with regular rhythm GI: incision: lap sites to the abdomen with dermabond without erythema or drainage and abdomen distended, active bowel sounds, diffuse tenderness and guarding on palpation, more on the left abdomen Extremities: extremities normal, atraumatic, no cyanosis or edema  Labs: WBC/Hgb/Hct/Plts:  8.3/11.4/36.4/188 (12/01 0412) BUN/Cr/glu/ALT/AST/amyl/lip:  6/0.80/--/--/--/--/-- (12/01 9242)  Assessment: 45 y.o. s/p Procedure(s): 01/20/2021: XI ROBOTIC ASSISTED RIGHT  SALPINGECTOMY, LEFT OVARIAN BIOPSY (Right), DILATATION &  CURETTAGE/HYSTEROSCOPY WITH MYOSURE (N/A) Planned 01/21/2021: XI ROBOTIC ASSISTED TOTAL HYSTERECTOMY WITH UNILATERAL SALPINGO OOPHORECTOMY COLON RESECTION SIGMOID POSSIBLE LAPAROTOMY WITH STAGING  Pain:  Pain is not well-controlled on PRN medications. Slight improvement when changing dilaudid to every 3 hours prn but pt states does not last long.  Heme: Hgb 11.4 and Hct 36.4 this am.  ID: WBC count 8.3 this am. Afebrile. Continue to monitor  CV: Tachycardic-may be related to increased pain, dehydration from bowel prep-also differential of potential bowel injury from surgery on 01/20/2021.  GI:  Tolerating po: NPO for upcoming surgery. Bowel prep for possible colon resection on 01/22/21.   GU: Voiding with intermittent incontinence episodes. Creatinine 0.80. Urine culture from 01/20/21 with no growth.  FEN: K+ from Cmet this am at 6.3- recheck at 06:16 am at 4.2. No other critical values reported.  Endo: Glucose on Cmet at 4:12 am on 11/30 at 556. Recheck at 124.   Prophylaxis: SCDs, received heparin dose 120 minutes pre-operatively.  Plan: Plan to proceed with completion surgery later this am with Dr. Berline Lopes and Dr. Dema Severin with possible bowel resection. Reinforced NPO status. RN notified to assess IV for possible infiltration. Continue plan of care per Dr. Berline Lopes.   LOS: 2 days    Brittney Herrera 01/22/2021, 12:01 PM

## 2021-01-22 NOTE — Consult Note (Signed)
CC: Possible need for segmental colectomy concurrently with gyn/onc procedure  Requesting provider: Dr. Dorian Pod MD  HPI: Brittney Herrera is an 45 y.o. female whom has hx of complex adnexal mass and some elevated tumor markers and was taken the operating room 01/20/2021 with Dr. Berline Lopes for hysteroscopy, endometrial sampling, diagnostic laparoscopy, robotic right salpingectomy, enterolysis, aborted hysterectomy and left salpingo-oophorectomy.  Intraoperatively, there was concern for potential involvement of the sigmoid colon with this adnexal mass.  One of my partners evaluated the patient intraoperatively.  It was noted that the rectum and distalmost sigmoid colon 18 cm were intraluminally normal on rigid proctoscopy without evident injury.  She was given a bowel prep yesterday and plans for return to OR today for further surgery with the possibility of a partial colectomy.  I spent time today reviewing our role with her potentially in her procedure if necessary.  Past Medical History:  Diagnosis Date   Arthritis    Bicornuate uterus    Hemorrhoids 11/18/2020   History of abnormal cervical Pap smear    Hypertriglyceridemia 11/18/2020   Hypothyroidism    Rheumatoid aortitis    Thyroid disease    Umbilical hernia 37/34/2876    Past Surgical History:  Procedure Laterality Date   ADENOIDECTOMY  02/22/1982   CESAREAN SECTION  04/07/2005   CESAREAN SECTION  09/24/2011   cyst N/A 12/15/2020   2 scalp cysts removed, benign   DILATATION & CURETTAGE/HYSTEROSCOPY WITH MYOSURE N/A 01/20/2021   Procedure: DILATATION & CURETTAGE/HYSTEROSCOPY WITH MYOSURE;  Surgeon: Lafonda Mosses, MD;  Location: [facility];  Service: Gynecology;  Laterality: N/A;   DILATION AND CURETTAGE, DIAGNOSTIC / THERAPEUTIC  12/23/2012   MYRINGOTOMY WITH TUBE PLACEMENT     TUBAL LIGATION Bilateral 12/23/2012   WISDOM TOOTH EXTRACTION  02/22/1993   XI ROBOTIC ASSISTED SALPINGECTOMY Right 01/20/2021   Procedure:  XI ROBOTIC ASSISTED RIGHT  SALPINGECTOMY, LEFT OVARIAN BIOPSY;  Surgeon: Lafonda Mosses, MD;  Location: [facility];  Service: Gynecology;  Laterality: Right;    Family History  Problem Relation Age of Onset   Diabetes Father    Leukemia Father    Heart disease Maternal Grandmother    Heart disease Maternal Grandfather    Heart disease Paternal Grandmother    Skin cancer Other    Colon cancer Neg Hx    Breast cancer Neg Hx    Ovarian cancer Neg Hx    Endometrial cancer Neg Hx    Prostate cancer Neg Hx    Pancreatic cancer Neg Hx     Social:  reports that she quit smoking about 9 years ago. Her smoking use included cigarettes. She has a 5.00 pack-year smoking history. She has never used smokeless tobacco. She reports current alcohol use. She reports that she does not use drugs.  Allergies: No Known Allergies  Medications: I have reviewed the patient's current medications.  Results for orders placed or performed during the hospital encounter of 01/20/21 (from the past 48 hour(s))  Urine Culture     Status: None   Collection Time: 01/20/21 12:56 PM   Specimen: Urine, Catheterized  Result Value Ref Range   Specimen Description      URINE, CATHETERIZED Performed at [place] [address], [place], [place] [address]    Special Requests      NONE Performed at [facility], [place] [address], [place], [place] [address]    Culture      NO GROWTH Performed at  [facility], [place] [address] [place], [place] [address]    Report Status 01/21/2021 FINAL   CBC     Status: Abnormal   Collection Time: 01/21/21  5:40 AM  Result Value Ref Range   WBC 7.2 4.0 - 10.5 K/uL   RBC 3.54 (L) 3.87 - 5.11 MIL/uL   Hemoglobin 12.0 12.0 - 15.0 g/dL   HCT 38.4 36.0 - 46.0 %   MCV 108.5 (H) 80.0 - 100.0 fL   MCH 33.9 26.0 - 34.0 pg   MCHC 31.3 30.0 - 36.0 g/dL   RDW 13.0 11.5 - 15.5 %   Platelets 212 150 - 400 K/uL   nRBC 0.0 0.0 - 0.2 %     Comment: Performed at [facility], [place] [address] [place], [place] [address]  Basic metabolic panel     Status: Abnormal   Collection Time: 01/21/21  5:40 AM  Result Value Ref Range   Sodium 135 135 - 145 mmol/L   Potassium 4.4 3.5 - 5.1 mmol/L   Chloride 105 98 - 111 mmol/L   CO2 26 22 - 32 mmol/L   Glucose, Bld 153 (H) 70 - 99 mg/dL    Comment: Glucose reference range applies only to samples taken after fasting for at least 8 hours.   BUN 9 6 - 20 mg/dL   Creatinine, Ser 0.77 0.44 - 1.00 mg/dL   Calcium 8.2 (L) 8.9 - 10.3 mg/dL   GFR, Estimated >60 >60 mL/min    Comment: (NOTE) Calculated using the CKD-EPI Creatinine Equation (2021)    Anion gap 4 (L) 5 - 15    Comment: Performed at [facility], [place] [address], [place], [place] [address]  SARS Coronavirus 2 by RT PCR (hospital order, performed in [facility] hospital lab) Nasopharyngeal Nasopharyngeal Swab     Status: None   Collection Time: 01/21/21  3:50 PM   Specimen: Nasopharyngeal Swab  Result Value Ref Range   SARS Coronavirus 2 NEGATIVE NEGATIVE    Comment: (NOTE) SARS-CoV-2 target nucleic acids are NOT DETECTED.  The SARS-CoV-2 RNA is generally detectable in upper and lower respiratory specimens during the acute phase of infection. The lowest concentration of SARS-CoV-2 viral copies this assay can detect is 250 copies / mL. A negative result does not preclude SARS-CoV-2 infection and should not be used as the sole basis for treatment or other patient management decisions.  A negative result may occur with improper specimen collection / handling, submission of specimen other than nasopharyngeal swab, presence of viral mutation(s) within the areas targeted by this assay, and inadequate number of viral copies (<250 copies / mL). A negative result must be combined with clinical observations, patient history, and epidemiological information.  Fact Sheet for Patients:    StrictlyIdeas.no  Fact Sheet for Healthcare Providers: BankingDealers.co.za  This test is not yet approved or  cleared by the [place] FDA and has been authorized for detection and/or diagnosis of SARS-CoV-2 by FDA under an Emergency Use Authorization (EUA).  This EUA will remain in effect (meaning this test can be used) for the duration of the COVID-19 declaration under Section 564(b)(1) of the Act, 21 U.S.C. section 360bbb-3(b)(1), unless the authorization is terminated or revoked sooner.  Performed at [facility], [place] [address], [place], [place] [address]   CBC     Status: Abnormal   Collection Time: 01/22/21  4:12 AM  Result Value Ref Range   WBC 8.3 4.0 - 10.5  K/uL   RBC 3.38 (L) 3.87 - 5.11 MIL/uL   Hemoglobin 11.4 (L) 12.0 - 15.0 g/dL   HCT 36.4 36.0 - 46.0 %   MCV 107.7 (H) 80.0 - 100.0 fL   MCH 33.7 26.0 - 34.0 pg   MCHC 31.3 30.0 - 36.0 g/dL   RDW 13.3 11.5 - 15.5 %   Platelets 188 150 - 400 K/uL   nRBC 0.0 0.0 - 0.2 %    Comment: Performed at [facility], [place] [address], [place], [place] [address]  Type and screen [place]     Status: None   Collection Time: 01/22/21  4:12 AM  Result Value Ref Range   ABO/RH(D) A POS    Antibody Screen NEG    Sample Expiration      01/25/2021,2359 Performed at [facility], [place] [address] [place], [place] [address]   Comprehensive metabolic panel     Status: Abnormal   Collection Time: 01/22/21  4:12 AM  Result Value Ref Range   Sodium 127 (L) 135 - 145 mmol/L    Comment: DELTA CHECK NOTED NO VISIBLE HEMOLYSIS    Potassium 6.3 (HH) 3.5 - 5.1 mmol/L    Comment: CRITICAL RESULT CALLED TO, READ BACK BY AND VERIFIED WITH:  ANGELICA SMITH RN 41/6/60 @ 0507 VS    Chloride 105 98 - 111 mmol/L   CO2 18 (L) 22 - 32 mmol/L   Glucose, Bld 556 (HH) 70 - 99 mg/dL    Comment: Glucose reference range  applies only to samples taken after fasting for at least 8 hours. CRITICAL RESULT CALLED TO, READ BACK BY AND VERIFIED WITH:  ANGELICA SMITH RN 63/0/16 @ 0507 VS    BUN 6 6 - 20 mg/dL   Creatinine, Ser 0.70 0.44 - 1.00 mg/dL   Calcium 6.8 (L) 8.9 - 10.3 mg/dL   Total Protein 5.4 (L) 6.5 - 8.1 g/dL   Albumin 2.4 (L) 3.5 - 5.0 g/dL   AST 16 15 - 41 U/L   ALT 19 0 - 44 U/L   Alkaline Phosphatase 59 38 - 126 U/L   Total Bilirubin 1.0 0.3 - 1.2 mg/dL   GFR, Estimated >60 >60 mL/min    Comment: (NOTE) Calculated using the CKD-EPI Creatinine Equation (2021)    Anion gap 4 (L) 5 - 15    Comment: Performed at [facility], [place] [address], [place],  [address]  Glucose, capillary     Status: Abnormal   Collection Time: 01/22/21  5:53 AM  Result Value Ref Range   Glucose-Capillary 129 (H) 70 - 99 mg/dL    Comment: Glucose reference range applies only to samples taken after fasting for at least 8 hours.  Basic metabolic panel     Status: Abnormal   Collection Time: 01/22/21  6:16 AM  Result Value Ref Range   Sodium 133 (L) 135 - 145 mmol/L   Potassium 4.2 3.5 - 5.1 mmol/L    Comment: NO VISIBLE HEMOLYSIS DELTA CHECK NOTED    Chloride 104 98 - 111 mmol/L   CO2 21 (L) 22 - 32 mmol/L   Glucose, Bld 124 (H) 70 - 99 mg/dL    Comment: Glucose reference range applies only to samples taken after fasting for at least 8 hours.   BUN 6 6 - 20 mg/dL   Creatinine, Ser 0.80 0.44 - 1.00 mg/dL   Calcium 7.8 (L) 8.9 - 10.3 mg/dL   GFR, Estimated >60 >  60 mL/min    Comment: (NOTE) Calculated using the CKD-EPI Creatinine Equation (2021)    Anion gap 8 5 - 15    Comment: Performed at [facility], [place] [address] [place], [place] [address]    No results found.  ROS - all of the below systems have been reviewed with the patient and positives are indicated with bold text General: chills, fever or night sweats Eyes: blurry vision or double vision ENT:  epistaxis or sore throat Allergy/Immunology: itchy/watery eyes or nasal congestion Hematologic/Lymphatic: bleeding problems, blood clots or swollen lymph nodes Endocrine: temperature intolerance or unexpected weight changes Breast: new or changing breast lumps or nipple discharge Resp: cough, shortness of breath, or wheezing CV: chest pain or dyspnea on exertion GI: as per HPI GU: dysuria, trouble voiding, or hematuria MSK: joint pain or joint stiffness Neuro: TIA or stroke symptoms Derm: pruritus and skin lesion changes Psych: anxiety and depression  PE Blood pressure 110/76, pulse (!) 110, temperature 99.1 F (37.3 C), temperature source Oral, resp. rate 19, height 5' 2.5" (1.588 m), weight 86.2 kg, last menstrual period 12/23/2020, SpO2 97 %. Constitutional: NAD; conversant; no deformities Eyes: Moist conjunctiva; no lid lag; anicteric; PERRL Neck: Trachea midline; no thyromegaly Lungs: Normal respiratory effort; no tactile fremitus CV: RRR; no palpable thrills; no pitting edema GI: Abd soft, moderate tenderness to palpation throughout particularly around incisions MSK: Normal range of motion of extremities; no clubbing/cyanosis Psychiatric: Appropriate affect; alert and oriented x3 Lymphatic: No palpable cervical or axillary lymphadenopathy  Results for orders placed or performed during the hospital encounter of 01/20/21 (from the past 48 hour(s))  Urine Culture     Status: None   Collection Time: 01/20/21 12:56 PM   Specimen: Urine, Catheterized  Result Value Ref Range   Specimen Description      URINE, CATHETERIZED Performed at [facility], [place] [address] [place], [place] [address]    Special Requests      NONE Performed at [facility], [place] [address], [place], [place] [address]    Culture      NO GROWTH Performed at [facility], [place] [address] [place], [place] [address]    Report Status 01/21/2021 FINAL   CBC      Status: Abnormal   Collection Time: 01/21/21  5:40 AM  Result Value Ref Range   WBC 7.2 4.0 - 10.5 K/uL   RBC 3.54 (L) 3.87 - 5.11 MIL/uL   Hemoglobin 12.0 12.0 - 15.0 g/dL   HCT 38.4 36.0 - 46.0 %   MCV 108.5 (H) 80.0 - 100.0 fL   MCH 33.9 26.0 - 34.0 pg   MCHC 31.3 30.0 - 36.0 g/dL   RDW 13.0 11.5 - 15.5 %   Platelets 212 150 - 400 K/uL   nRBC 0.0 0.0 - 0.2 %    Comment: Performed at [facility], [place] [address], [place],  [address]  Basic metabolic panel     Status: Abnormal   Collection Time: 01/21/21  5:40 AM  Result Value Ref Range   Sodium 135 135 - 145 mmol/L   Potassium 4.4 3.5 - 5.1 mmol/L   Chloride 105 98 - 111 mmol/L   CO2 26 22 - 32 mmol/L   Glucose, Bld 153 (H) 70 - 99 mg/dL    Comment: Glucose reference range applies only to samples taken after fasting for at least 8 hours.   BUN 9 6 - 20 mg/dL  Creatinine, Ser 0.77 0.44 - 1.00 mg/dL   Calcium 8.2 (L) 8.9 - 10.3 mg/dL   GFR, Estimated >60 >60 mL/min    Comment: (NOTE) Calculated using the CKD-EPI Creatinine Equation (2021)    Anion gap 4 (L) 5 - 15    Comment: Performed at [facility], [place] [address] [place], [place] [address]  SARS Coronavirus 2 by RT PCR (hospital order, performed in [facility] hospital lab) Nasopharyngeal Nasopharyngeal Swab     Status: None   Collection Time: 01/21/21  3:50 PM   Specimen: Nasopharyngeal Swab  Result Value Ref Range   SARS Coronavirus 2 NEGATIVE NEGATIVE    Comment: (NOTE) SARS-CoV-2 target nucleic acids are NOT DETECTED.  The SARS-CoV-2 RNA is generally detectable in upper and lower respiratory specimens during the acute phase of infection. The lowest concentration of SARS-CoV-2 viral copies this assay can detect is 250 copies / mL. A negative result does not preclude SARS-CoV-2 infection and should not be used as the sole basis for treatment or other patient management decisions.  A negative result may occur  with improper specimen collection / handling, submission of specimen other than nasopharyngeal swab, presence of viral mutation(s) within the areas targeted by this assay, and inadequate number of viral copies (<250 copies / mL). A negative result must be combined with clinical observations, patient history, and epidemiological information.  Fact Sheet for Patients:   StrictlyIdeas.no  Fact Sheet for Healthcare Providers: BankingDealers.co.za  This test is not yet approved or  cleared by the [place] FDA and has been authorized for detection and/or diagnosis of SARS-CoV-2 by FDA under an Emergency Use Authorization (EUA).  This EUA will remain in effect (meaning this test can be used) for the duration of the COVID-19 declaration under Section 564(b)(1) of the Act, 21 U.S.C. section 360bbb-3(b)(1), unless the authorization is terminated or revoked sooner.  Performed at [facility], [place] [address], [place], [place] [address]   CBC     Status: Abnormal   Collection Time: 01/22/21  4:12 AM  Result Value Ref Range   WBC 8.3 4.0 - 10.5 K/uL   RBC 3.38 (L) 3.87 - 5.11 MIL/uL   Hemoglobin 11.4 (L) 12.0 - 15.0 g/dL   HCT 36.4 36.0 - 46.0 %   MCV 107.7 (H) 80.0 - 100.0 fL   MCH 33.7 26.0 - 34.0 pg   MCHC 31.3 30.0 - 36.0 g/dL   RDW 13.3 11.5 - 15.5 %   Platelets 188 150 - 400 K/uL   nRBC 0.0 0.0 - 0.2 %    Comment: Performed at [facility], [place] [address] [place], [place] [address]  Type and screen [place]     Status: None   Collection Time: 01/22/21  4:12 AM  Result Value Ref Range   ABO/RH(D) A POS    Antibody Screen NEG    Sample Expiration      01/25/2021,2359 Performed at [facility], [place] [address] [place], [place] [address]   Comprehensive metabolic panel     Status: Abnormal   Collection Time: 01/22/21  4:12 AM  Result Value Ref Range    Sodium 127 (L) 135 - 145 mmol/L    Comment: DELTA CHECK NOTED NO VISIBLE HEMOLYSIS    Potassium 6.3 (HH) 3.5 - 5.1 mmol/L    Comment: CRITICAL RESULT CALLED TO, READ BACK BY AND VERIFIED WITH:  Marney Doctor RN 49/4/49 @ 0507 VS  Chloride 105 98 - 111 mmol/L   CO2 18 (L) 22 - 32 mmol/L   Glucose, Bld 556 (HH) 70 - 99 mg/dL    Comment: Glucose reference range applies only to samples taken after fasting for at least 8 hours. CRITICAL RESULT CALLED TO, READ BACK BY AND VERIFIED WITH:  ANGELICA SMITH RN 27/7/41 @ 0507 VS    BUN 6 6 - 20 mg/dL   Creatinine, Ser 0.70 0.44 - 1.00 mg/dL   Calcium 6.8 (L) 8.9 - 10.3 mg/dL   Total Protein 5.4 (L) 6.5 - 8.1 g/dL   Albumin 2.4 (L) 3.5 - 5.0 g/dL   AST 16 15 - 41 U/L   ALT 19 0 - 44 U/L   Alkaline Phosphatase 59 38 - 126 U/L   Total Bilirubin 1.0 0.3 - 1.2 mg/dL   GFR, Estimated >60 >60 mL/min    Comment: (NOTE) Calculated using the CKD-EPI Creatinine Equation (2021)    Anion gap 4 (L) 5 - 15    Comment: Performed at [facility], [place] [address], [place], [place] [address]  Glucose, capillary     Status: Abnormal   Collection Time: 01/22/21  5:53 AM  Result Value Ref Range   Glucose-Capillary 129 (H) 70 - 99 mg/dL    Comment: Glucose reference range applies only to samples taken after fasting for at least 8 hours.  Basic metabolic panel     Status: Abnormal   Collection Time: 01/22/21  6:16 AM  Result Value Ref Range   Sodium 133 (L) 135 - 145 mmol/L   Potassium 4.2 3.5 - 5.1 mmol/L    Comment: NO VISIBLE HEMOLYSIS DELTA CHECK NOTED    Chloride 104 98 - 111 mmol/L   CO2 21 (L) 22 - 32 mmol/L   Glucose, Bld 124 (H) 70 - 99 mg/dL    Comment: Glucose reference range applies only to samples taken after fasting for at least 8 hours.   BUN 6 6 - 20 mg/dL   Creatinine, Ser 0.80 0.44 - 1.00 mg/dL   Calcium 7.8 (L) 8.9 - 10.3 mg/dL   GFR, Estimated >60 >60 mL/min    Comment: (NOTE) Calculated using the CKD-EPI  Creatinine Equation (2021)    Anion gap 8 5 - 15    Comment: Performed at [facility], [place] [address], [place],  [address]    No results found.   A/P: Brittney Herrera is an 45 y.o. female with complex adnexal mass, may involve sigmoid colon  -We discussed our role with her today and what a segmental colectomy and/or colon repair may involve.  -The potential planned procedures, material risks (including, but not limited to, pain, bleeding, infection, need for blood transfusion, damage to surrounding structures- blood vessels/nerves/viscus/organs, damage to ureter, leak from anastomosis or repair, need for additional procedures, scenarios where a stoma could be necessary, worsening of pre-existing medical conditions, hernia, recurrence, pneumonia, heart attack, stroke, death) benefits and alternatives to surgery were discussed at length. The patient's questions were answered to her satisfaction, she voiced understanding and elected to proceed with surgery. Additionally, we discussed typical postoperative expectations and the recovery process.  Nadeen Landau, MD [facility] Surgery Use [email or web address] to contact on call provider

## 2021-01-22 NOTE — Anesthesia Postprocedure Evaluation (Signed)
Anesthesia Post Note  Patient: Cielo Arias  Procedure(s) Performed: EXPLORATORY LAPAROTOMY CYSTOSCOPY WITH RETROGRADE PYELOGRAM;FISTULOGRAM DIAGNOSTIC FLEXIBLE SIGMOIDOSCOPY EXPLORATORY LAPAROTOMY     Patient location during evaluation: PACU Anesthesia Type: General Level of consciousness: sedated Pain management: pain level controlled Vital Signs Assessment: post-procedure vital signs reviewed and stable Respiratory status: spontaneous breathing and respiratory function stable Cardiovascular status: stable Postop Assessment: no apparent nausea or vomiting Anesthetic complications: no   No notable events documented.  Last Vitals:  Vitals:   01/22/21 1700 01/22/21 1711  BP: 106/77 121/81  Pulse: 80 85  Resp: 13 18  Temp:  (!) 36.3 C  SpO2: 98% 98%    Last Pain:  Vitals:   01/22/21 1700  TempSrc:   PainSc: 0-No pain                 Iyanna Drummer DANIEL

## 2021-01-22 NOTE — Op Note (Signed)
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 Dema Severin), backfill of bladder, cytoscopy with retrograde pyelogram and attempted fistulogram (with Dr. Milford Cage)  Surgeon: Jeral Pinch MD  Assistant Surgeon: Lahoma Crocker MD (an MD assistant was necessary for tissue manipulation, management of robotic instrumentation, retraction and positioning due to the complexity of the case and hospital policies).   Anesthesia: GET  Urine Output: 300 cc  Operative Findings: On diagnostic laparoscopy, there were adhesions of the omentum and several loops of small bowel in the left pelvis to the anterior abdominal wall.  Significant edema and fibrinous exudate noted on small bowel and peritoneum concerning for possible bowel injury.  Ascites noted although no definitive feculent material.  On laparotomy, fibrinous exudate was noted on edematous small bowel.  No evidence of injury to the small bowel or colon.  Area of the sigmoid where prior dissection had been performed on Tuesday was intact both intra-abdominally and on flexible sigmoidoscopy there is no evidence of mucosal injury or disruption.  Bubble test again negative.  Left adnexa persistently enlarged with cautery effect noted with her prior biopsy taken.  Fibrinous exudate over much of pelvic organs, most notably the bladder peritoneum.  Right ovary viable in appearance.  Bladder backfilled with methylene blue and no extravasation noted intra-abdominally.  Ureters intact without extravasation on retrograde pyelogram.   Cystoscopy notable for small defect in the bladder with what appeared to be purulent discharge consistent with a fistula.  Fistulogram performed with contrast that appeared to be within a contained structure, suspected to be the cervix and possibly left uterine horn.  Unable to appreciate whether this fistula also communicates with the left adnexa.  Estimated Blood Loss:  150cc  Total IV Fluids: see I&O flowsheet         Specimens: none         Complications:  None apparent; patient tolerated the procedure well.         Disposition: PACU - hemodynamically stable.  Procedure Details  The patient was seen in the Holding Room. The risks, benefits, complications, treatment options, and expected outcomes were discussed with the patient.  The patient concurred with the proposed plan, giving informed consent.  The site of surgery properly noted/marked. The patient was identified as Brittney Herrera and the procedure verified as a Robotic-assisted hysterectomy with left oophorectomy, possible sigmoid resection, possible laparotomy.   After induction of anesthesia, the patient was draped and prepped in the usual sterile manner. Patient was placed in supine position after anesthesia and draped and prepped in the usual sterile manner as follows: Her arms were tucked to her side with all appropriate precautions.  The shoulders were stabilized with padded shoulder blocks applied to the acromium processes.  The patient was placed in the semi-lithotomy position in [place].  The perineum and vagina were prepped with CholoraPrep. The patient was draped after the CholoraPrep had been allowed to dry for 3 minutes.  A Time Out was held and the above information confirmed.  The urethra was prepped with Betadine. Foley catheter was placed.  A sterile speculum was placed in the vagina.  The cervix was  grasped with a single-tooth tenaculum. The cervix was already dilated an a 3.5 Delineator manipulator was placed. OG  was confirmed to suction.   Next, the Dermabond was removed from all prior robotic incisions.  The 10 mm Palmer's point incision was opened and the 5 mm Optiview port and scope was used for direct entry.  Opening pressure was under 10 mm CO2.  The abdomen was insufflated and the findings were noted as above.  Given concern for possible bowel injury, decision was made to convert to laparotomy.  With the abdomen still insufflated, a right paramedian incision was made with a scalpel and carried down to the fascia with monopolar electrocautery.  The fascia was incised with monopolar electrocautery and ultimately the peritoneum opened sharply.  The peritoneal incision was extended with monopolar electrocautery.  With the aid of Dr. Dema Severin, the small bowel was run from the ligament of Treitz to the cecum twice.  While fibrinous exudate was noted over multiple areas of the small bowel, no obvious defect or injury was seen.  During manipulation of the small bowel and its mesentery, due to edema and inflammation, an approximately 2 cm defect in the mesentery was created.  This was oversewn by Dr. Dema Severin.  The large colon was inspected from the cecum to the sigmoid without evidence of injury.  Given concern for the integrity of the sigmoid where prior dissection had been performed on Tuesday, Dr. Dema Severin performed a flexible sigmoidoscopy to a level approximately 10 cm above area of prior dissection.  Please see his operative note for findings but no evidence of mucosal injury.  Additionally, bubble test performed with colon insufflated for the sigmoidoscopy with no evidence of leak.    At this point, given continued accumulation of ascites, attention was turned to the urinary tract.  There was significant exudate covering the bladder peritoneum.  The bladder was backfilled with about 300 cc of sterile fluid with methylene blue. No extravasation of the dye was noted into the abdomen. To aid in assessing the integrity of the  ureters, interoperative consult was placed to urology.  Indigocarmine was given intravenously.  No blue dye seen in the abdomen.  Dr. Milford Cage performed cystoscopy and retrograde pyelogram. Ureters both appeared intact without evidence of contrast extravasation on fluoroscopy. On cystoscopy, there was a defect noted superior to the left ureteral orifice where there appeared to be pus emanating. This pus was removed and in fact a defect, suspected to be a fistula, was noted. Catheter tip was placed into the opening and contrast was injected to perform, in essence, a fistulogram. Based on fluoroscopy findings, I suspect that the patient has a fistula between her bladder and cervix, possibly also including the left adnexal mass. On intra-abdominal inspection during cystoscopy, no clear evidence of this fistula could be seen intraperitoneally. Given ascites produced during the surgery and the concern that instrumentation on Tuesday followed by removal of the Foley catheter as likely lead to intra-abdominal findings at the start of today's surgery, decision was made to keep an indwelling Foley catheter in place as well as to place to pelvic JP drains.  JP drains were placed within the pelvis via bilateral lateral laparoscopic incisions.  Drains were sewn to the skin using a drain stitch and secured with drain sponges.  The abdomen and pelvis was copiously irrigated.  The fascia of the laparotomy incision was closed with running #1 PDS sutures tied in the midline. The subcutaneous tissue was irrigated and Exparel was injected for  local anesthesia. The subcutaneous tissue was reapproximated with several interrupted 2-0 Vicryl stitchess. The skin was closed with absorbable staples expect at the very inferior aspect where regular staples were used. The incision was covered with a honeycomb dressing.  The fascia at the 10-12 mm port was closed with 0 Vicryl on a UR-5 needle.  The subcuticular tissue was closed with 4-0  Vicryl and the skin was closed with 4-0 Monocryl in a subcuticular manner.  Dermabond was applied.    The vagina was swabbed with minimal bleeding noted. All sponge, lap and needle counts were correct x  3.   The patient was transferred to the recovery room in stable condition.  Jeral Pinch, MD

## 2021-01-22 NOTE — Op Note (Signed)
Preoperative diagnosis:  1.  Possible intraperitoneal urine leak unknown source  Postoperative diagnosis: 1.  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  Cystoscopy, bilateral retrograde pyelogram with intraoperative interpretation, fistulogram  Surgeon: Dr. Harold Barban Co-surgeon: Dr. Berline Lopes Anesthesia: General  Complications: None  EBL: Minimal  Specimens: None  Disposition of specimens: Not applicable  Intraoperative findings: Patient had normal ureters bilaterally without evidence of extravasation.  There appeared to be a fistula in the left posterior lateral bladder wall superior to the left ureteral orifice with purulent drainage.  Fistulogram showed contained cavity which may be fistulizing to the uterus and adnexal region on the left.  Indication: Patient is a 45 year old white female with left adnexal mass.  She was undergoing procedure for possible salpingooophorectomy and was noted to have intraperitoneal fluid consistent with possible urine leak.  No obvious ureteral injury or bladder injury noted.  I was consulted intraoperatively for evaluation of bladder and to exclude ureteral injury.  Description of procedure:  Patient was asleep and had laparotomy incision at time of consultation.  Methylene blue was given intravenously prior to cystoscopy.  Cystoscopy was performed and bladder was thoroughly inspected.  Ureteral orifice ease appeared intact and were effluxing blue tinted urine.  8 [place] cone-tip catheter was utilized perform retrograde pyelograms on both sides revealed normal filling of the ureters up to the level of the renal pelvis.  The fluoroscopy would not go all the way to the kidney but the ureters were intact without evidence of extravasation of contrast.  There was a abnormal area with some purulent drainage in the left posterior lateral aspect of the bladder superior to the left ureteral orifice.  I utilized cone-tip catheter to  inject contrast which appeared to show a contained area of contrast with the fistulogram which may be fistulizing to the uterus and/or adnexal area.  It appeared to be retroperitoneal.  Upon removal of the open tip catheter there was a significant mount of purulent drainage into the bladder.  I injected some methylene blue within the fistula tract as well and the surgeons observed through the incision site no evidence of obvious extravasation of blue irrigation.  After discussion with the surgeons it was felt that it would be best to close and reassess with CT scan and place Foley catheter for bladder drainage.  Patient will likely need hysterectomy and salpingo-oophorectomy with possible partial bladder resection.  Patient was stable at termination of this portion of the procedure.  The remaining portion the procedure will be dictated under separate cover by general surgery and GYN oncology.

## 2021-01-22 NOTE — Progress Notes (Signed)
Date and time results received: 01/22/21 0515   Test: K+ and Glucose Critical Value: Potassium 6.5 and Glucose 556  Name of Provider Notified: Dr. Jeral Pinch  Orders Received? Or Actions Taken?: Repeat BMP

## 2021-01-23 ENCOUNTER — Encounter (HOSPITAL_COMMUNITY): Payer: Self-pay | Admitting: Gynecologic Oncology

## 2021-01-23 ENCOUNTER — Inpatient Hospital Stay (HOSPITAL_COMMUNITY): Payer: BC Managed Care – PPO

## 2021-01-23 LAB — BASIC METABOLIC PANEL
Anion gap: 5 (ref 5–15)
BUN: 8 mg/dL (ref 6–20)
CO2: 24 mmol/L (ref 22–32)
Calcium: 8.2 mg/dL — ABNORMAL LOW (ref 8.9–10.3)
Chloride: 105 mmol/L (ref 98–111)
Creatinine, Ser: 0.85 mg/dL (ref 0.44–1.00)
GFR, Estimated: >60 mL/min (ref >60)
Glucose, Bld: 142 mg/dL — ABNORMAL HIGH (ref 70–99)
Potassium: 3.8 mmol/L (ref 3.5–5.1)
Sodium: 134 mmol/L — ABNORMAL LOW (ref 135–145)

## 2021-01-23 LAB — CBC WITH DIFFERENTIAL/PLATELET
Abs Immature Granulocytes: 0.12 10*3/uL — ABNORMAL HIGH (ref 0.00–0.07)
Basophils Absolute: 0.1 10*3/uL (ref 0.0–0.1)
Basophils Relative: 1 %
Eosinophils Absolute: 0 10*3/uL (ref 0.0–0.5)
Eosinophils Relative: 0 %
HCT: 37.2 % (ref 36.0–46.0)
Hemoglobin: 11.9 g/dL — ABNORMAL LOW (ref 12.0–15.0)
Immature Granulocytes: 1 %
Lymphocytes Relative: 8 %
Lymphs Abs: 0.8 10*3/uL (ref 0.7–4.0)
MCH: 34 pg (ref 26.0–34.0)
MCHC: 32 g/dL (ref 30.0–36.0)
MCV: 106.3 fL — ABNORMAL HIGH (ref 80.0–100.0)
Monocytes Absolute: 0.7 10*3/uL (ref 0.1–1.0)
Monocytes Relative: 7 %
Neutro Abs: 8.4 10*3/uL — ABNORMAL HIGH (ref 1.7–7.7)
Neutrophils Relative %: 83 %
Platelets: 206 10*3/uL (ref 150–400)
RBC: 3.5 MIL/uL — ABNORMAL LOW (ref 3.87–5.11)
RDW: 13.2 % (ref 11.5–15.5)
WBC: 10.1 10*3/uL (ref 4.0–10.5)
nRBC: 0 % (ref 0.0–0.2)

## 2021-01-23 LAB — CREATININE, FLUID (PLEURAL, PERITONEAL, JP DRAINAGE): Creat, Fluid: 0.8 mg/dL

## 2021-01-23 IMAGING — MR MR PELVIS WO/W CM
19 of 20 series · 46 of 48 positions shown · IV contrast (GADAVIST)
Comparison: CT abdomen/pelvis dated [DATE]

CLINICAL DATA: Adnexal mass, bladder fistula

EXAM:
MRI PELVIS WITHOUT AND WITH CONTRAST
TECHNIQUE: Multiplanar multisequence MR imaging of the pelvis was performed
both before and after administration of intravenous contrast.
CONTRAST:  7mL GADAVIST GADOBUTROL 1 MMOL/ML IV SOLN

[Series 2: T2 · coronal · 6.0mm · 1.56mm/px · 2 of 30 slices shown (1 of 4)]
[im 1/30]
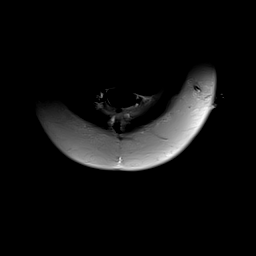
[im 30/30]
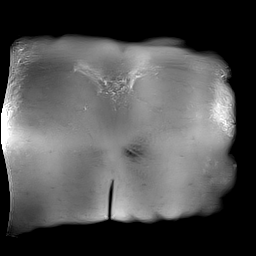

[Series 3: T2 · axial · 5.0mm · 0.59mm/px · 1 of 34 slices shown (2 of 4)]
[im 1/34]
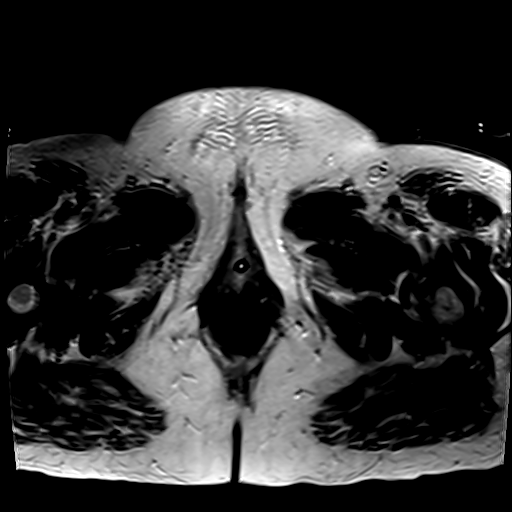

[Series 4: T2 fat-sat · axial · 5.0mm · 0.59mm/px · 1 of 34 slices shown]
[im 1/34]
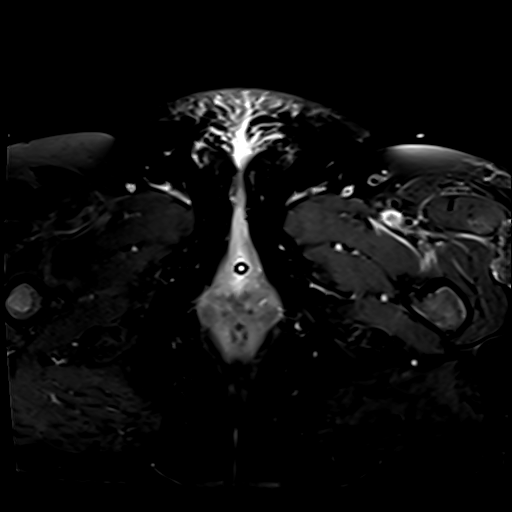

[Series 5: T2 · sagittal · 5.0mm · 0.55mm/px · 2 of 40 slices shown (3 of 4)]
[im 1/40]
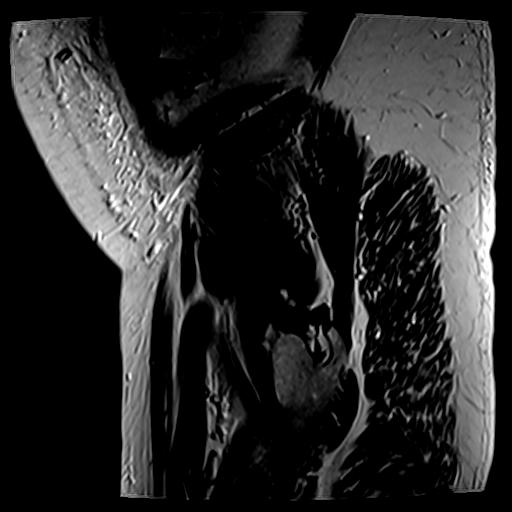
[im 40/40]
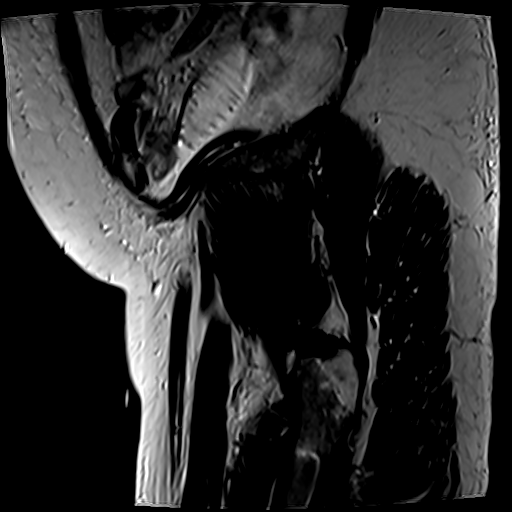

[Series 6: T1 · axial · 4.0mm · 0.94mm/px · z∈[-103,+181]mm · 3 of 72 slices shown (1 of 2)]
[im 1/72]
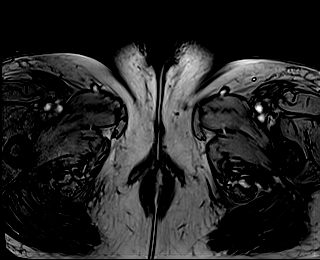
[im 36/72]
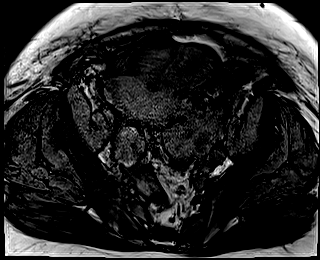
[im 72/72]
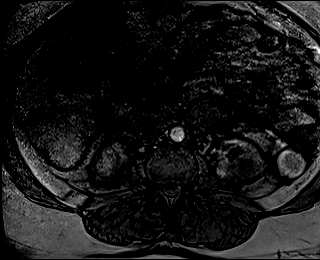

[Series 7: T1 · axial · 4.0mm · 0.94mm/px · z∈[-103,+181]mm · 3 of 72 slices shown (2 of 2)]
[im 1/72]
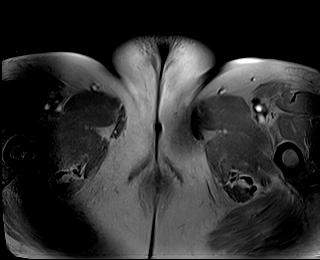
[im 36/72]
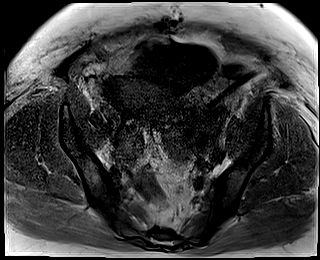
[im 72/72]
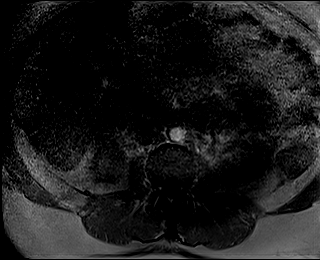

[Series 8: DWI · axial · 5.0mm · 3.00mm/px · z∈[-82,+106]mm · 4 of 90 slices shown (1 of 3)]
[im 1/90]
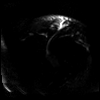
[im 30/90]
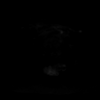
[im 60/90]
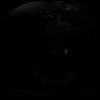
[im 90/90]
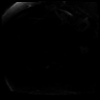

[Series 9: DWI · axial · 5.0mm · 3.00mm/px · 1 of 30 slices shown (2 of 3)]
[im 1/30]
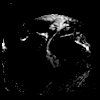

[Series 10: DWI · axial · 5.0mm · 3.00mm/px · 1 of 30 slices shown (3 of 3)]
[im 1/30]
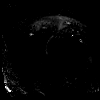

[Series 11: T2 · coronal · 5.0mm · 0.55mm/px · 1 of 34 slices shown (4 of 4)]
[im 1/34]
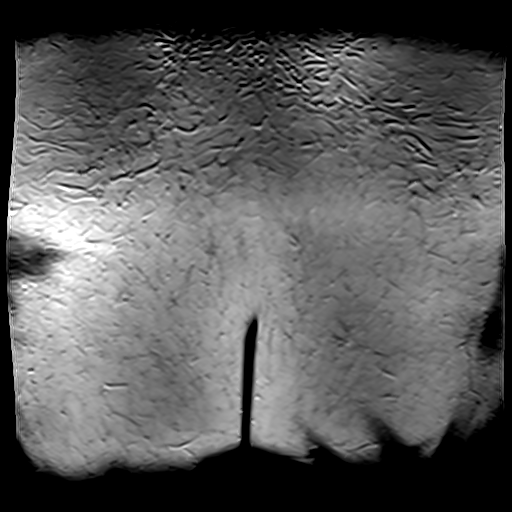

[Series 13: T1 dynamic · axial · 3.0mm · 0.94mm/px · z∈[-102,+135]mm · 3 of 80 slices shown (1 of 7)]
[im 1/80]
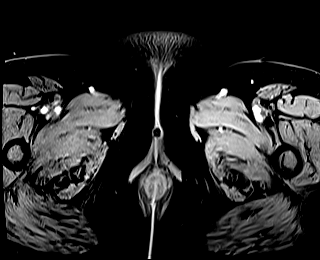
[im 40/80]
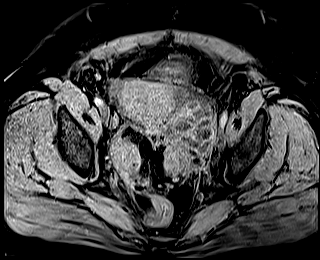
[im 80/80]
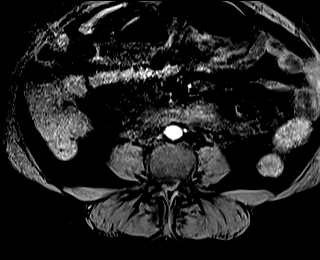

[Series 16: T1 dynamic · axial · 3.0mm · 0.94mm/px · z∈[-102,+135]mm · 3 of 80 slices shown (2 of 7)]
[im 1/80]
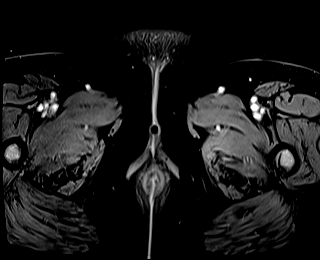
[im 40/80]
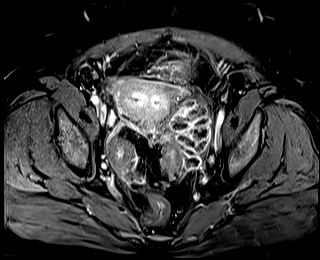
[im 80/80]
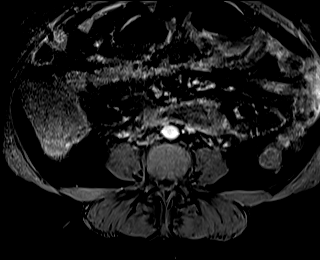

[Series 17: T1 dynamic · axial · 3.0mm · 0.94mm/px · z∈[-93,+135]mm · 3 of 77 slices shown (3 of 7)]
[im 1/77]
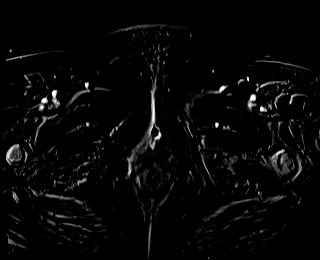
[im 39/77]
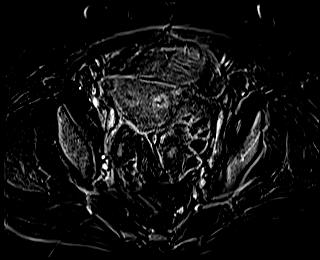
[im 77/77]
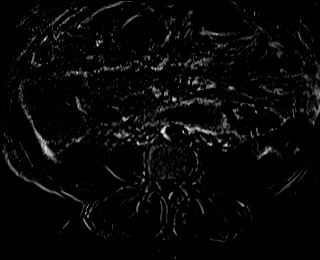

[Series 20: T1 dynamic · axial · 3.0mm · 0.94mm/px · z∈[-102,+135]mm · 3 of 80 slices shown (4 of 7)]
[im 1/80]
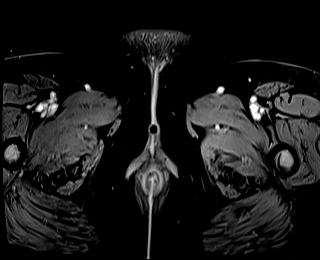
[im 40/80]
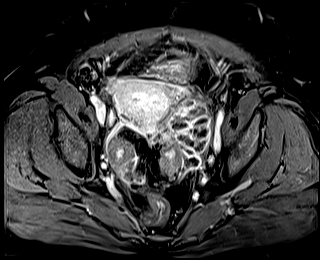
[im 80/80]
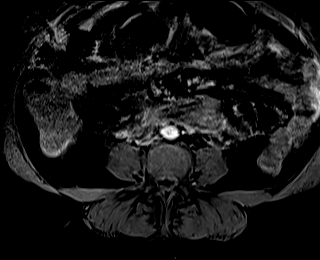

[Series 21: T1 dynamic · axial · 3.0mm · 0.94mm/px · z∈[-102,+135]mm · 3 of 78 slices shown (5 of 7)]
[im 1/78]
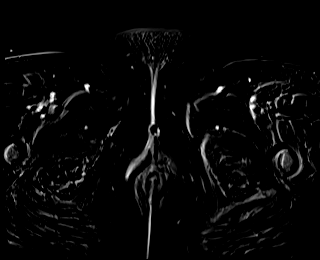
[im 39/78]
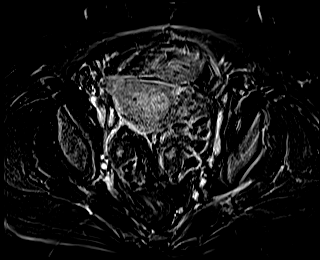
[im 78/78]
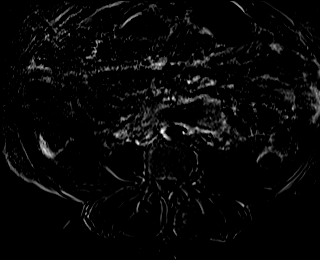

[Series 24: T1 dynamic · axial · 3.0mm · 0.94mm/px · z∈[-102,+135]mm · 3 of 80 slices shown (6 of 7)]
[im 1/80]
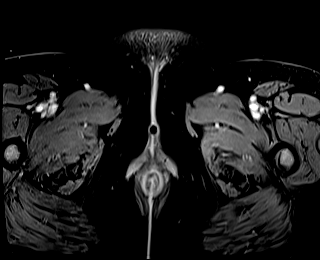
[im 40/80]
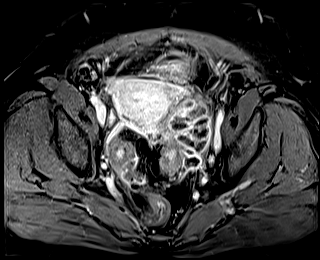
[im 80/80]
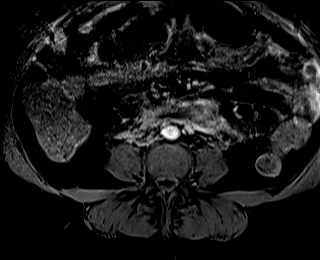

[Series 25: T1 dynamic · axial · 3.0mm · 0.94mm/px · z∈[-102,+135]mm · 3 of 79 slices shown (7 of 7)]
[im 1/79]
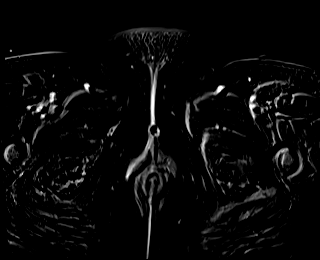
[im 40/79]
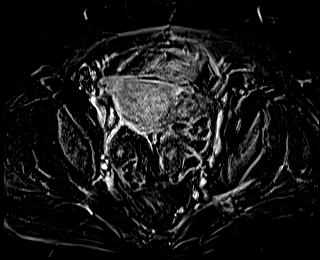
[im 79/79]
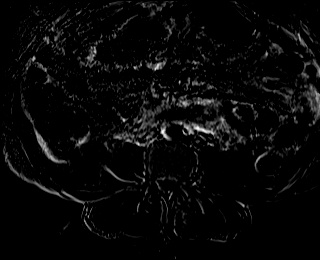

[Series 27: T1 dynamic post-contrast · axial · 3.0mm · 1.06mm/px · z∈[-92,+145]mm · 3 of 80 slices shown (1 of 2)]
[im 1/80]
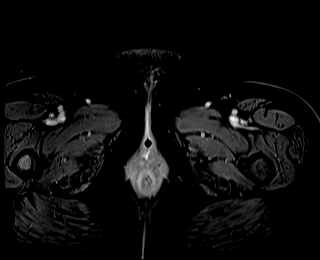
[im 40/80]
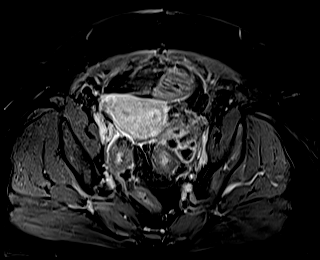
[im 80/80]
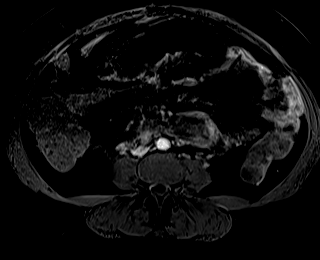

[Series 29: T1 dynamic post-contrast · sagittal · 3.0mm · 0.94mm/px · 3 of 80 slices shown (2 of 2)]
[im 1/80]
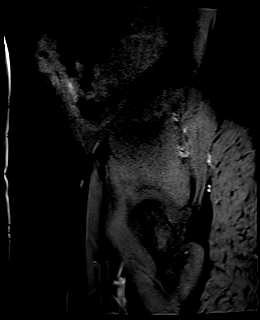
[im 40/80]
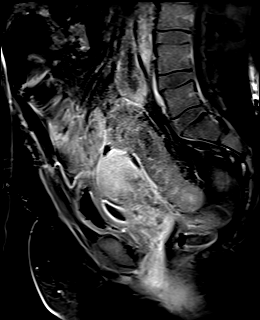
[im 80/80]
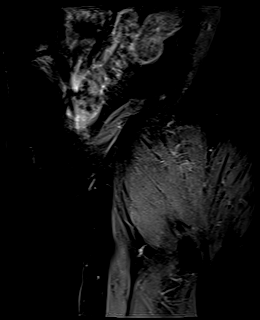

[46 of 48 positions shown; findings below may reference images not displayed]

FINDINGS: Urinary Tract: Bladder with indwelling Foley catheter and
nondependent gas. The patient's known bladder fistula is not evident
on MR.

Bowel: Dilated loops of small bowel in the lower abdomen, favoring
adynamic small bowel ileus in the setting of recent surgery. Mild
rectosigmoid colonic wall thickening, suggesting
infectious/inflammatory colitis, better evaluated on CT.

Vascular/Lymphatic: No evidence of aneurysm.

No suspicious pelvic lymphadenopathy.

Reproductive:  Uterus and right ovary are unremarkable.

6.6 x 4.4 cm multiloculated cystic lesion in the left ovary/adnexa,
with enhancing septations following contrast administration (series
24/image 42). When correlating with prior CT, there was high
density/contrast within the lesion (series 2/image 82 on CT),
suggesting of this is likely related to the patient's known bladder
fistula.

Other:  Pelvic surgical drain, better evaluated on CT.

Small volume mesenteric pelvic ascites.

Musculoskeletal: No focal osseous lesions.
IMPRESSION: 6.6 cm multiloculated cystic lesion in the left ovary/adnexa. Given
the high density within this lesion on prior CT, when correlating
with the operative report, this is likely secondary to communication
with the patient's known bladder fistula.

However, the fistulous track itself is not evident on MR. Bladder
with indwelling Foley catheter and nondependent gas.

Mild rectosigmoid colonic wall thickening, better evaluated on CT,
unchanged.

Dilated loops of small bowel in the lower abdomen, favoring adynamic
small bowel ileus in the setting of recent surgery.

## 2021-01-23 IMAGING — CT CT ABD-PELV W/ CM
2 of 5 series · 14 of 46 positions shown, 16 images · IV contrast (OMNIPAQUE)
Comparison: None.

CLINICAL DATA: Abdominal pain. Peritonitis. Bladder fistula. Postop
day 1 status post exploratory laparotomy.

EXAM:
CT ABDOMEN AND PELVIS WITH CONTRAST
TECHNIQUE: Multidetector CT imaging of the abdomen and pelvis was performed
using the standard protocol following bolus administration of
intravenous contrast.
CONTRAST:  75mL OMNIPAQUE IOHEXOL 350 MG/ML SOLN

[Series 2: axial st · axial · 0.82mm/px · z∈[-580,-145]mm · 11 of 105 slices shown, 13 images]
[im 9/105  soft-tissue]
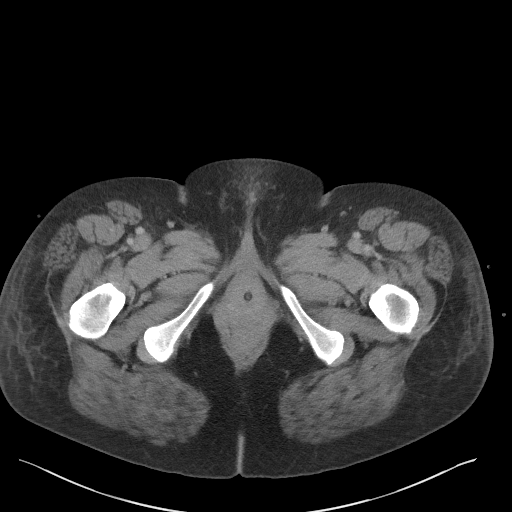
[im 9/105  bone]
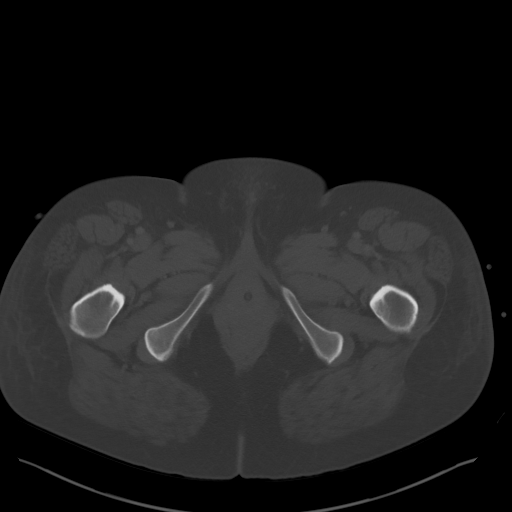
[im 18/105  soft-tissue]
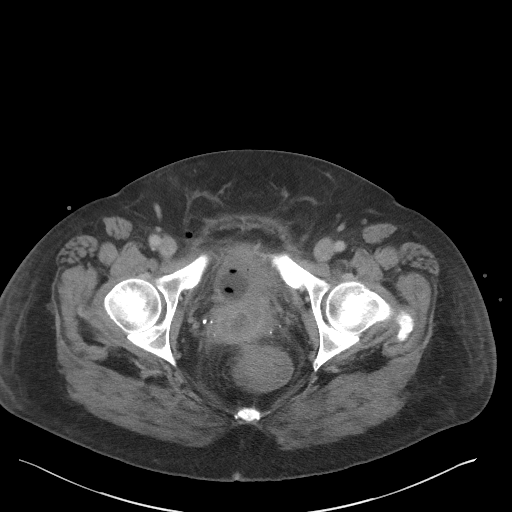
[im 27/105  soft-tissue]
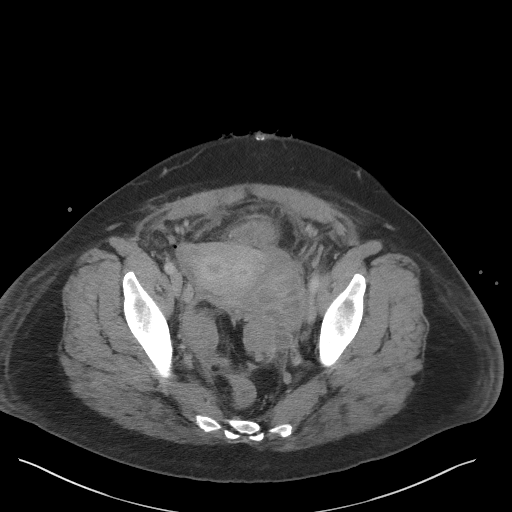
[im 35/105  soft-tissue]
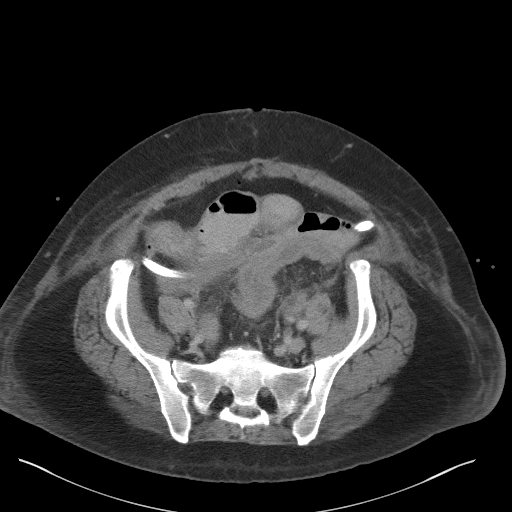
[im 44/105  soft-tissue]
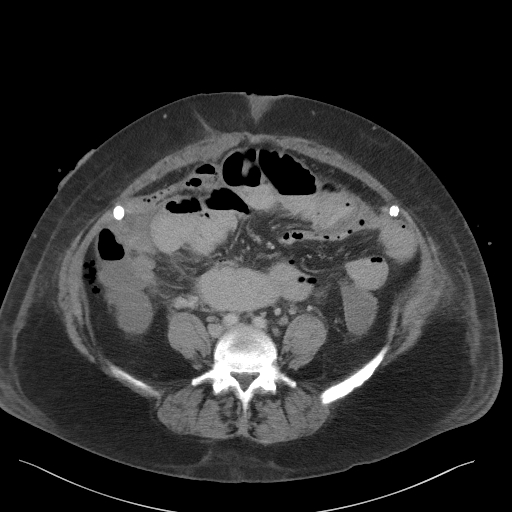
[im 53/105  soft-tissue]
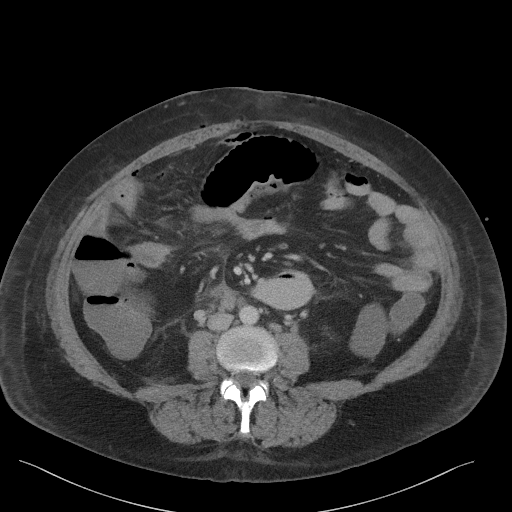
[im 61/105  soft-tissue]
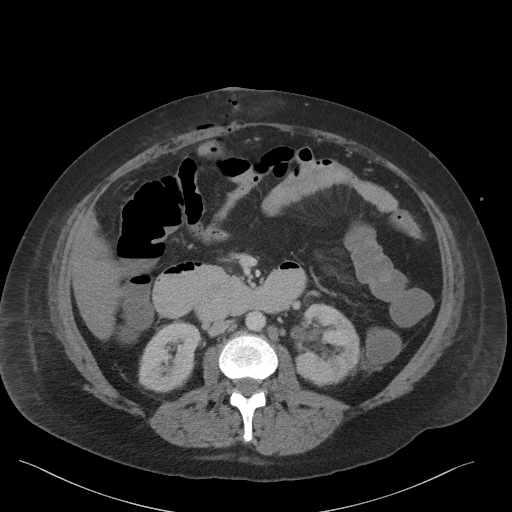
[im 70/105  soft-tissue]
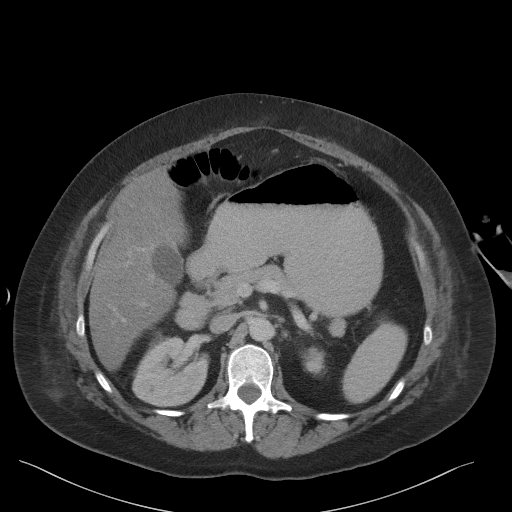
[im 79/105  soft-tissue]
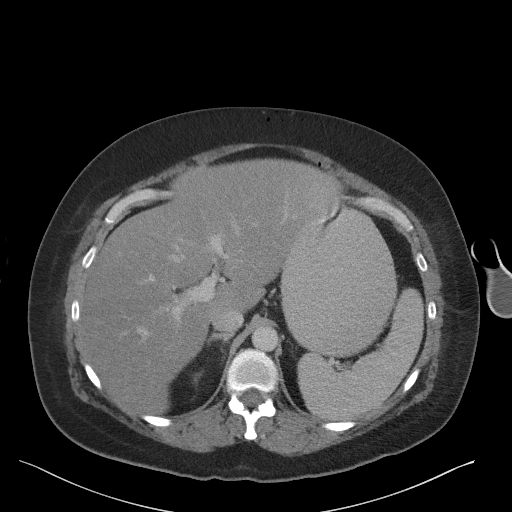
[im 79/105  bone]
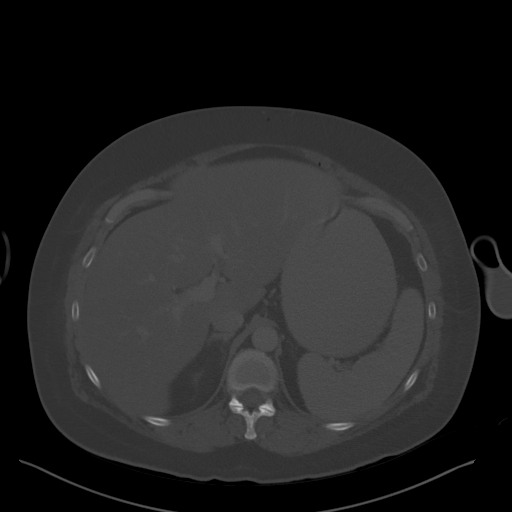
[im 87/105  soft-tissue]
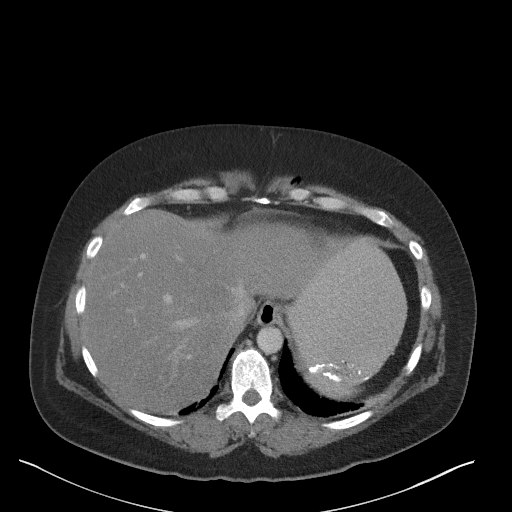
[im 96/105  soft-tissue]
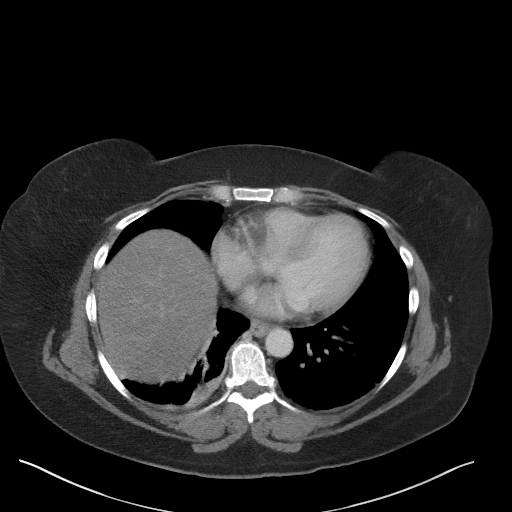

[Series 5: coronal st · coronal · 0.88mm/px · 3 of 93 slices shown]
[im 31/93  soft-tissue]
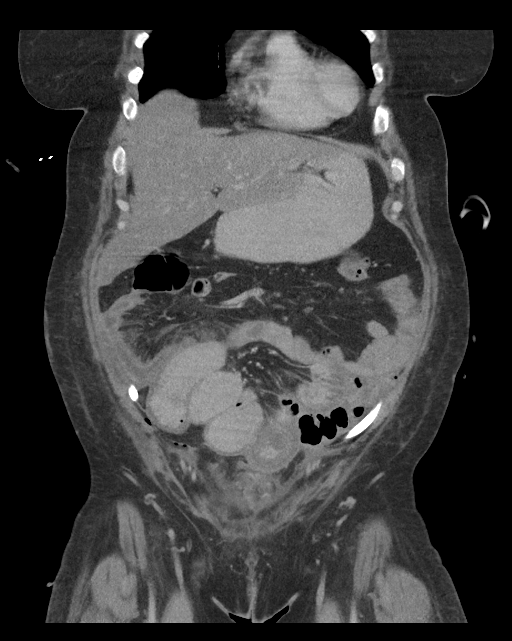
[im 41/93  soft-tissue]
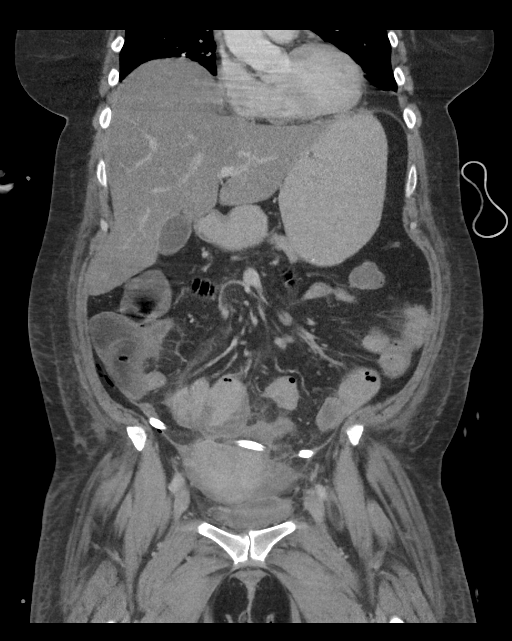
[im 52/93  soft-tissue]
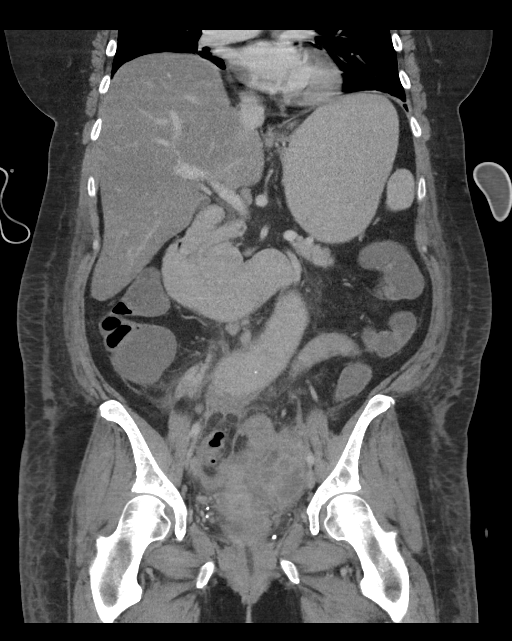

[14 of 46 positions shown; findings below may reference images not displayed]

FINDINGS: Lower Chest: Right lower lobe subsegmental atelectasis.

Hepatobiliary: No hepatic masses identified. Moderate diffuse
steatosis. Gallbladder is unremarkable. No evidence of biliary
ductal dilatation.

Pancreas:  No mass or inflammatory changes.

Spleen: Within normal limits in size and appearance.

Adrenals/Urinary Tract: 2 mm calculus in midpole of left kidney.
Tiny sub-cm cyst in lower pole of left kidney. No masses identified.
No evidence of ureteral calculi or hydronephrosis. Foley catheter
within urinary bladder which is nearly completely empty.

Stomach/Bowel: Postop changes are seen from recent laparotomy, with
small amount of residual free air. Surgical drains are seen in both
lower quadrants. Moderately dilated small bowel loops are seen
containing air-fluid levels. Moderate small bowel wall thickening is
seen in the anterior pelvis, with an apparent transition point at
this location causing partial small bowel obstruction. Adjacent soft
tissue stranding is seen in the central pelvis, and there is also
moderate wall thickening of the sigmoid colon. No abscess
identified.

Vascular/Lymphatic: Shotty lymph nodes are seen throughout the
abdominal retroperitoneum and pelvis measuring less than 1 cm in
size. These are likely reactive in etiology. No acute vascular
findings.

Reproductive: Septate or bicornuate uterus is noted. A complex
cystic lesion is seen in the left adnexa which measures 6.2 x
cm. This could represent abscess or endometriosis, with cystic
ovarian neoplasm considered less likely.

Other:  None.

Musculoskeletal:  No suspicious bone lesions identified.
IMPRESSION: Postop changes from recent laparotomy, with small amount of residual
free air.

Moderate small bowel wall thickening in the anterior pelvis, with
proximal dilatation and apparent transition point at this location.
This is suspicious for partial small bowel obstruction.

Moderate wall thickening of the sigmoid colon, consistent with
colitis. Differential diagnosis includes diverticulitis and other
causes of colitis.

6.2 cm complex cystic lesion in left adnexa. Differential diagnosis
includes tubo-ovarian abscess, diverticular abscess, endometriosis,
or less likely cystic ovarian neoplasm.

Moderate hepatic steatosis.

Tiny nonobstructing left renal calculus.

## 2021-01-23 MED ORDER — ENOXAPARIN SODIUM 40 MG/0.4ML IJ SOSY
40.0000 mg | PREFILLED_SYRINGE | INTRAMUSCULAR | Status: DC
  Administered 2021-01-23 – 2021-01-27 (×5): 40 mg via SUBCUTANEOUS
  Filled 2021-01-23 (×5): qty 0.4

## 2021-01-23 MED ORDER — MIRABEGRON ER 25 MG PO TB24
25.0000 mg | ORAL_TABLET | Freq: Every day | ORAL | Status: DC
  Administered 2021-01-23 – 2021-01-28 (×6): 25 mg via ORAL
  Filled 2021-01-23 (×6): qty 1

## 2021-01-23 MED ORDER — SODIUM CHLORIDE (PF) 0.9 % IJ SOLN
INTRAMUSCULAR | Status: AC
  Filled 2021-01-23: qty 50

## 2021-01-23 MED ORDER — DIATRIZOATE MEGLUMINE & SODIUM 66-10 % PO SOLN
15.0000 mL | ORAL | Status: AC
  Administered 2021-01-23 (×2): 15 mL via ORAL
  Filled 2021-01-23: qty 30

## 2021-01-23 MED ORDER — IOHEXOL 350 MG/ML SOLN
75.0000 mL | Freq: Once | INTRAVENOUS | Status: AC | PRN
  Administered 2021-01-23: 75 mL via INTRAVENOUS

## 2021-01-23 MED ORDER — LORAZEPAM 0.5 MG PO TABS
0.5000 mg | ORAL_TABLET | Freq: Four times a day (QID) | ORAL | Status: DC | PRN
  Administered 2021-01-23 – 2021-01-27 (×9): 0.5 mg via ORAL
  Filled 2021-01-23 (×9): qty 1

## 2021-01-23 MED ORDER — DIATRIZOATE MEGLUMINE & SODIUM 66-10 % PO SOLN
ORAL | Status: AC
  Filled 2021-01-23: qty 30

## 2021-01-23 NOTE — Progress Notes (Signed)
1 Day Post-Op Procedure(s) (LRB): EXPLORATORY LAPAROTOMY (N/A) CYSTOSCOPY WITH RETROGRADE PYELOGRAM;FISTULOGRAM DIAGNOSTIC FLEXIBLE SIGMOIDOSCOPY (N/A) EXPLORATORY LAPAROTOMY (N/A)  Subjective: Patient reports having moderate abdominal pain, more with movement. Tolerating clears with no nausea or emesis. Reports significant urine leakage around the catheter. Having less vaginal bleeding than yesterday. Denies flatus and is burping frequently. Abdomen feels full. Husband at the bedside. Tearful and stating she is frustrated with everything going on. All questions answered.  Objective: Vital signs in last 24 hours: Temp:  [97.3 F (36.3 C)-99.3 F (37.4 C)] 97.5 F (36.4 C) (12/02 0506) Pulse Rate:  [76-119] 80 (12/02 0506) Resp:  [12-20] 18 (12/02 0506) BP: (100-129)/(71-88) 124/88 (12/02 0506) SpO2:  [92 %-100 %] 99 % (12/02 0506) Weight:  [189 lb 9.5 oz (86 kg)] 189 lb 9.5 oz (86 kg) (12/01 1045) Last BM Date: 01/21/21  Intake/Output from previous day: 12/01 0701 - 12/02 0700 In: 6961 [P.O.:360; I.V.:4996.6; IV Piggyback:1604.4] Out: 3465 [Urine:2735; Drains:580; Blood:150]  Physical Examination: General: alert, cooperative, moderate distress, and crying after having drains stripped Resp: clear to auscultation bilaterally Cardio: regular rate and rhythm, S1, S2 normal, no murmur, click, rub or gallop and tachycardia improving GI: incision: Lap site incisions with dermabond intact with no drainage and midline incision with op site in place, stained. Abdomen distended, moderately tenderness with palpation, JP on right emptied with 20 cc out, JP on left with 10 cc out- serosanguinous drainage, severe pain with JP stripping on left.  Extremities: extremities normal, atraumatic, no cyanosis or edema Foley with concentrated yellow urine. 600 cc emptied. JP dressings stained  Labs: WBC/Hgb/Hct/Plts:  10.1/11.9/37.2/206 (12/02 0459) BUN/Cr/glu/ALT/AST/amyl/lip:  8/0.85/--/--/--/--/--  (12/02 0459)  Assessment: 45 y.o. s/p Procedure(s): 01/20/2021: XI ROBOTIC ASSISTED RIGHT  SALPINGECTOMY, LEFT OVARIAN BIOPSY (Right), DILATATION & CURETTAGE/HYSTEROSCOPY WITH MYOSURE (N/A) 01/21/2021: XI ROBOTIC ASSISTED TOTAL HYSTERECTOMY WITH UNILATERAL SALPINGO OOPHORECTOMY COLON RESECTION SIGMOID POSSIBLE LAPAROTOMY WITH STAGING  Pain:  Pain is not somewhat controlled on PRN medications. Very anxious.  Heme: Hgb 11.9 and Hct 37.2 this am-stable compared with previous values.  ID: WBC count 10.1 this am. Afebrile. Continue to monitor. On cefoxitin due to pelvic inflammation.  CV: Tachycardic improved. Continue to monitor with ordered vital signs.  GI:  Tolerating po: sipping on clears. High risk for post-op ileus. Continue with clear liquids until return of bowel function.  GU: Foley in place with intermittent incontinence episodes. Creatinine 0.85. Urine culture from 01/20/21 with no growth. Dr. Berline Lopes spoke with Dr. Milford Cage. Plan to begin mybetriq for bladder spasms and to send drainage from JP drain for creatinine. No evidence of blue dye in intraoperative fluids.  FEN: K+ from [facility] this am at 3.8. No critical values reported.  Endo: Glucose has been stable. Continue to monitor on ordered labs.    Prophylaxis: SCDs, DVT prop given preop and will be ordered today.  Plan: CT scan of the abdomen and pelvis-further evaluate fistula and bowels, no intraoperative evidence of bowel injury with fistula noted in bladder. MRI pelvis with and without contrast Am labs Mybetriq ordered along with body fluid creatinine from JP drain per Dr. Adah Perl ordered PRN for anxiety Continue plan of care per Dr. Berline Lopes.   LOS: 3 days    Dorothyann Gibbs 01/23/2021, 10:20 AM

## 2021-01-23 NOTE — Progress Notes (Signed)
Patient ID: Brittney Herrera, female   DOB: October 01, 1975, 45 y.o.   MRN: [id number] [facility] Surgery Progress Note  1 Day Post-Op  Subjective: CC-  Having a lot of pain today.  Voiding through foley, but having some leakage around the foley as well.  Some distention and nausea.  Vomited yesterday  WBC 10.1, afebrile  Objective: Vital signs in last 24 hours: Temp:  [97.3 F (36.3 C)-99.3 F (37.4 C)] 97.5 F (36.4 C) (12/02 0506) Pulse Rate:  [76-119] 80 (12/02 0506) Resp:  [12-20] 18 (12/02 0506) BP: (100-129)/(71-88) 124/88 (12/02 0506) SpO2:  [92 %-100 %] 99 % (12/02 0506) Weight:  [86 kg] 86 kg (12/01 1045) Last BM Date: 01/21/21  Intake/Output from previous day: 12/01 0701 - 12/02 0700 In: 6961 [P.O.:360; I.V.:4996.6; IV Piggyback:1604.4] Out: 3465 [Urine:2735; Drains:580; Blood:150] Intake/Output this shift: Total I/O In: -  Out: 360 [Urine:300; Drains:60]  PE: Abd: Soft, some distention, appropriately tender, incisions C/D/I with honeycomb dressing in place over midline incisoin, drains with serosanguinous drainage, no evidence of bile or intestinal contents   Lab Results:  Recent Labs    01/22/21 0412 01/23/21 0459  WBC 8.3 10.1  HGB 11.4* 11.9*  HCT 36.4 37.2  PLT 188 206   BMET Recent Labs    01/22/21 0616 01/23/21 0459  NA 133* 134*  K 4.2 3.8  CL 104 105  CO2 21* 24  GLUCOSE 124* 142*  BUN 6 8  CREATININE 0.80 0.85  CALCIUM 7.8* 8.2*   PT/INR No results for input(s): LABPROT, INR in the last 72 hours. CMP     Component Value Date/Time   NA 134 (L) 01/23/2021 0459   K 3.8 01/23/2021 0459   CL 105 01/23/2021 0459   CO2 24 01/23/2021 0459   GLUCOSE 142 (H) 01/23/2021 0459   BUN 8 01/23/2021 0459   CREATININE 0.85 01/23/2021 0459   CALCIUM 8.2 (L) 01/23/2021 0459   PROT 5.4 (L) 01/22/2021 0412   ALBUMIN 2.4 (L) 01/22/2021 0412   AST 16 01/22/2021 0412   ALT 19 01/22/2021 0412   ALKPHOS 59 01/22/2021 0412   BILITOT 1.0  01/22/2021 0412   GFRNONAA >60 01/23/2021 0459   Lipase  No results found for: LIPASE     Studies/Results: DG C-Arm 1-60 Min-No Report  Result Date: 01/22/2021 Fluoroscopy was utilized by the requesting physician.  No radiographic interpretation.    Anti-infectives: Anti-infectives (From admission, onward)    Start     Dose/Rate Route Frequency Ordered Stop   01/22/21 2200  metroNIDAZOLE (FLAGYL) IVPB 500 mg        500 mg 100 mL/hr over 60 Minutes Intravenous Every 12 hours 01/22/21 1713     01/22/21 2100  cefOXitin (MEFOXIN) 2 g in sodium chloride 0.9 % 100 mL IVPB        2 g 200 mL/hr over 30 Minutes Intravenous Every 6 hours 01/22/21 1713     01/22/21 1430  cefOXitin (MEFOXIN) 2 g in sodium chloride 0.9 % 100 mL IVPB  Status:  Discontinued        2 g 200 mL/hr over 30 Minutes Intravenous On call to O.R. 01/22/21 1419 01/22/21 1658   01/22/21 1230  metroNIDAZOLE (FLAGYL) IVPB 500 mg        500 mg 100 mL/hr over 60 Minutes Intravenous  Once 01/22/21 1218 01/22/21 1252   01/22/21 1220  metroNIDAZOLE (FLAGYL) 500 MG/100ML IVPB       Note to Pharmacy: Lieutenant Diego: cabinet  override      01/22/21 1220 01/22/21 1222   01/22/21 0600  cefOXitin (MEFOXIN) 2 g in sodium chloride 0.9 % 100 mL IVPB        2 g 200 mL/hr over 30 Minutes Intravenous On call to O.R. 01/21/21 1108 01/22/21 1500   01/22/21 0000  erythromycin (E-MYCIN) tablet 1,000 mg  Status:  Discontinued       See Hyperspace for full Linked Orders Report.   1,000 mg Oral Every 6 hours 01/20/21 1456 01/21/21 1657   01/22/21 0000  neomycin (MYCIFRADIN) tablet 1,000 mg  Status:  Discontinued       See Hyperspace for full Linked Orders Report.   1,000 mg Oral  Once 01/20/21 1456 01/21/21 1657   01/21/21 2100  ciprofloxacin (CIPRO) tablet 500 mg       Note to Pharmacy: Cipro 500 mg at 9 pm on 01/21/2021   500 mg Oral As directed 01/21/21 1657 01/21/21 2115   01/21/21 2100  metroNIDAZOLE (FLAGYL) tablet 500 mg       Note  to Pharmacy: Flagyl oral 500 mg at 9 pm on 01/21/2021 and repeat dose at 11 pm on 01/21/2021   500 mg Oral 2 times daily 01/21/21 1657 01/21/21 2316   01/21/21 2000  neomycin (MYCIFRADIN) tablet 1,000 mg  Status:  Discontinued       See Hyperspace for full Linked Orders Report.   1,000 mg Oral Every hour 01/20/21 1456 01/21/21 1657   01/21/21 2000  erythromycin (E-MYCIN) tablet 1,000 mg  Status:  Discontinued       See Hyperspace for full Linked Orders Report.   1,000 mg Oral Every hour 01/20/21 1456 01/21/21 1657   01/20/21 0827  ceFAZolin (ANCEF) 2-4 GM/100ML-% IVPB       Note to Pharmacy: Georgena Spurling W: cabinet override      01/20/21 0827 01/20/21 2029   01/20/21 0800  ceFAZolin (ANCEF) IVPB 2g/100 mL premix        2 g 200 mL/hr over 30 Minutes Intravenous On call to O.R. 01/20/21 0759 01/20/21 1035        Assessment/Plan  Complex adnexal mass   -POD#1 s/p Diagnostic flexible sigmoidoscopy (to confirm integrity of rectum and sigmoid colon), Diagnostic laparoscopy, exploratory laparotomy with thorough inspection of small and large bowel, backfill of bladder, cytoscopy with retrograde pyelogram and attempted fistulogram (with Dr. Berline Lopes, Dr. Milford Cage, Dr. 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 - CT ordered this morning to confirm no intestinal injury per [facility] - We will follow  ID - cefoxitin/ flagyl FEN - IVF, CLD VTE - SCDs, ok for chemical dvt prophylaxis from our standpoint Foley - in place per urology  Hypothyroidism   LOS: 3 days    Henreitta Cea, [facility] 01/23/2021, 10:22 AM Please see Amion for pager number during day hours 7:00am-4:30pm

## 2021-01-24 LAB — CBC
HCT: 36.7 % (ref 36.0–46.0)
Hemoglobin: 11.7 g/dL — ABNORMAL LOW (ref 12.0–15.0)
MCH: 34.1 pg — ABNORMAL HIGH (ref 26.0–34.0)
MCHC: 31.9 g/dL (ref 30.0–36.0)
MCV: 107 fL — ABNORMAL HIGH (ref 80.0–100.0)
Platelets: 255 10*3/uL (ref 150–400)
RBC: 3.43 MIL/uL — ABNORMAL LOW (ref 3.87–5.11)
RDW: 13.7 % (ref 11.5–15.5)
WBC: 10.3 10*3/uL (ref 4.0–10.5)
nRBC: 0 % (ref 0.0–0.2)

## 2021-01-24 LAB — BASIC METABOLIC PANEL
Anion gap: 7 (ref 5–15)
BUN: 7 mg/dL (ref 6–20)
CO2: 23 mmol/L (ref 22–32)
Calcium: 8.1 mg/dL — ABNORMAL LOW (ref 8.9–10.3)
Chloride: 102 mmol/L (ref 98–111)
Creatinine, Ser: 0.78 mg/dL (ref 0.44–1.00)
GFR, Estimated: >60 mL/min (ref >60)
Glucose, Bld: 117 mg/dL — ABNORMAL HIGH (ref 70–99)
Potassium: 3.7 mmol/L (ref 3.5–5.1)
Sodium: 132 mmol/L — ABNORMAL LOW (ref 135–145)

## 2021-01-24 MED ORDER — GADOBUTROL 1 MMOL/ML IV SOLN
7.0000 mL | Freq: Once | INTRAVENOUS | Status: AC | PRN
  Administered 2021-01-24: 7 mL via INTRAVENOUS

## 2021-01-24 NOTE — Progress Notes (Signed)
Patient ID: Brittney Herrera, female   DOB: November 18, 1975, 45 y.o.   MRN: [id number] [facility] Surgery Progress Note  2 Days Post-Op  Subjective: CC-  Having less pain.  Voiding through foley, but having some leakage around the foley as well.  No nausea, had a BM   Objective: Vital signs in last 24 hours: Temp:  [98.1 F (36.7 C)-99 F (37.2 C)] 98.1 F (36.7 C) (12/03 0504) Pulse Rate:  [84-107] 84 (12/03 0504) Resp:  [18] 18 (12/02 2132) BP: (125-139)/(97-101) 125/97 (12/03 0504) SpO2:  [96 %] 96 % (12/03 0504) Last BM Date: 01/21/21  Intake/Output from previous day: 12/02 0701 - 12/03 0700 In: 3196.9 [P.O.:1360; I.V.:1436.8; IV Piggyback:400.1] Out: 3265 [Urine:2900; Drains:365] Intake/Output this shift: No intake/output data recorded.  PE: Abd: Soft, min distention, appropriately tender, incisions C/D/I with honeycomb dressing in place over midline incisoin, drains with serosanguinous drainage, no evidence of bile or intestinal contents   Lab Results:  Recent Labs    01/22/21 0412 01/23/21 0459  WBC 8.3 10.1  HGB 11.4* 11.9*  HCT 36.4 37.2  PLT 188 206    BMET Recent Labs    01/22/21 0616 01/23/21 0459  NA 133* 134*  K 4.2 3.8  CL 104 105  CO2 21* 24  GLUCOSE 124* 142*  BUN 6 8  CREATININE 0.80 0.85  CALCIUM 7.8* 8.2*    PT/INR No results for input(s): LABPROT, INR in the last 72 hours. CMP     Component Value Date/Time   NA 134 (L) 01/23/2021 0459   K 3.8 01/23/2021 0459   CL 105 01/23/2021 0459   CO2 24 01/23/2021 0459   GLUCOSE 142 (H) 01/23/2021 0459   BUN 8 01/23/2021 0459   CREATININE 0.85 01/23/2021 0459   CALCIUM 8.2 (L) 01/23/2021 0459   PROT 5.4 (L) 01/22/2021 0412   ALBUMIN 2.4 (L) 01/22/2021 0412   AST 16 01/22/2021 0412   ALT 19 01/22/2021 0412   ALKPHOS 59 01/22/2021 0412   BILITOT 1.0 01/22/2021 0412   GFRNONAA >60 01/23/2021 0459   Lipase  No results found for: LIPASE     Studies/Results: MR PELVIS W WO  CONTRAST  Result Date: 01/24/2021 CLINICAL DATA:  Adnexal mass, bladder fistula EXAM: MRI PELVIS WITHOUT AND WITH CONTRAST TECHNIQUE: Multiplanar multisequence MR imaging of the pelvis was performed both before and after administration of intravenous contrast. CONTRAST:  73mL GADAVIST GADOBUTROL 1 MMOL/ML IV SOLN COMPARISON:  CT abdomen/pelvis dated 01/23/2021 FINDINGS: Urinary Tract: Bladder with indwelling Foley catheter and nondependent gas. 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 Bowel: Dilated loops of small bowel in the lower abdomen, favoring adynamic small bowel ileus in the setting of recent surgery. Mild rectosigmoid colonic wall thickening, suggesting infectious/inflammatory colitis, better evaluated on CT. Vascular/Lymphatic: No evidence of aneurysm. No suspicious pelvic lymphadenopathy. Reproductive:  Uterus and right ovary are unremarkable. 6.6 x 4.4 cm multiloculated cystic lesion in the left ovary/adnexa, with enhancing septations following contrast administration (series 24/image 42). When correlating with prior CT, there was high density/contrast within the lesion (series 2/image 82 on CT), suggesting of this is likely related to the patient's known bladder fistula. Other:  Pelvic surgical drain, better evaluated on CT. Small volume mesenteric pelvic ascites. Musculoskeletal: No focal osseous lesions. IMPRESSION: 6.6 cm multiloculated cystic lesion in the left ovary/adnexa. Given the high density within this lesion on prior CT, when correlating with the operative report, this is likely secondary to communication with the patient's known  bladder fistula. However, the fistulous track itself is not evident on MR. Bladder with indwelling Foley catheter and nondependent gas. Mild rectosigmoid colonic wall thickening, better evaluated on CT, unchanged. Dilated loops of small bowel in the lower abdomen, favoring adynamic small bowel ileus in the setting of recent surgery. Electronically  Signed   By: Julian Hy M.D.   On: 01/24/2021 01:33   CT ABDOMEN PELVIS W CONTRAST  Result Date: 01/23/2021 CLINICAL DATA:  Abdominal pain. Peritonitis. Bladder fistula. Postop day 1 status post exploratory laparotomy. EXAM: CT ABDOMEN AND PELVIS WITH CONTRAST TECHNIQUE: Multidetector CT imaging of the abdomen and pelvis was performed using the standard protocol following bolus administration of intravenous contrast. CONTRAST:  80mL OMNIPAQUE IOHEXOL 350 MG/ML SOLN COMPARISON:  None. FINDINGS: Lower Chest: Right lower lobe subsegmental atelectasis. Hepatobiliary: No hepatic masses identified. Moderate diffuse steatosis. Gallbladder is unremarkable. No evidence of biliary ductal dilatation. Pancreas:  No mass or inflammatory changes. Spleen: Within normal limits in size and appearance. Adrenals/Urinary Tract: 2 mm calculus in midpole of left kidney. Tiny sub-cm cyst in lower pole of left kidney. No masses identified. No evidence of ureteral calculi or hydronephrosis. Foley catheter within urinary bladder which is nearly completely empty. Stomach/Bowel: Postop changes are seen from recent laparotomy, with small amount of residual free air. Surgical drains are seen in both lower quadrants. Moderately dilated small bowel loops are seen containing air-fluid levels. Moderate small bowel wall thickening is seen in the anterior pelvis, with an apparent transition point at this location causing partial small bowel obstruction. Adjacent soft tissue stranding is seen in the central pelvis, and there is also moderate wall thickening of the sigmoid colon. No abscess identified. Vascular/Lymphatic: Shotty lymph nodes are seen throughout the abdominal retroperitoneum and pelvis measuring less than 1 cm in size. These are likely reactive in etiology. No acute vascular findings. Reproductive: Septate or bicornuate uterus is noted. A complex cystic lesion is seen in the left adnexa which measures 6.2 x 5.2 cm. This could  represent abscess or endometriosis, with cystic ovarian neoplasm considered less likely. Other:  None. Musculoskeletal:  No suspicious bone lesions identified. IMPRESSION: Postop changes from recent laparotomy, with small amount of residual free air. Moderate small bowel wall thickening in the anterior pelvis, with proximal dilatation and apparent transition point at this location. This is suspicious for partial small bowel obstruction. Moderate wall thickening of the sigmoid colon, consistent with colitis. Differential diagnosis includes diverticulitis and other causes of colitis. 6.2 cm complex cystic lesion in left adnexa. Differential diagnosis includes tubo-ovarian abscess, diverticular abscess, endometriosis, or less likely cystic ovarian neoplasm. Moderate hepatic steatosis. Tiny nonobstructing left renal calculus. Electronically Signed   By: Marlaine Hind M.D.   On: 01/23/2021 15:15   DG C-Arm 1-60 Min-No Report  Result Date: 01/22/2021 Fluoroscopy was utilized by the requesting physician.  No radiographic interpretation.    Anti-infectives: Anti-infectives (From admission, onward)    Start     Dose/Rate Route Frequency Ordered Stop   01/22/21 2200  metroNIDAZOLE (FLAGYL) IVPB 500 mg        500 mg 100 mL/hr over 60 Minutes Intravenous Every 12 hours 01/22/21 1713 01/23/21 1023   01/22/21 2100  cefOXitin (MEFOXIN) 2 g in sodium chloride 0.9 % 100 mL IVPB        2 g 200 mL/hr over 30 Minutes Intravenous Every 6 hours 01/22/21 1713 01/23/21 1816   01/22/21 1430  cefOXitin (MEFOXIN) 2 g in sodium chloride 0.9 % 100 mL IVPB  Status:  Discontinued        2 g 200 mL/hr over 30 Minutes Intravenous On call to O.R. 01/22/21 1419 01/22/21 1658   01/22/21 1230  metroNIDAZOLE (FLAGYL) IVPB 500 mg        500 mg 100 mL/hr over 60 Minutes Intravenous  Once 01/22/21 1218 01/22/21 1252   01/22/21 1220  metroNIDAZOLE (FLAGYL) 500 MG/100ML IVPB       Note to Pharmacy: Eulas Post, April W: cabinet override       01/22/21 1220 01/22/21 1222   01/22/21 0600  cefOXitin (MEFOXIN) 2 g in sodium chloride 0.9 % 100 mL IVPB        2 g 200 mL/hr over 30 Minutes Intravenous On call to O.R. 01/21/21 1108 01/22/21 1500   01/22/21 0000  erythromycin (E-MYCIN) tablet 1,000 mg  Status:  Discontinued       See Hyperspace for full Linked Orders Report.   1,000 mg Oral Every 6 hours 01/20/21 1456 01/21/21 1657   01/22/21 0000  neomycin (MYCIFRADIN) tablet 1,000 mg  Status:  Discontinued       See Hyperspace for full Linked Orders Report.   1,000 mg Oral  Once 01/20/21 1456 01/21/21 1657   01/21/21 2100  ciprofloxacin (CIPRO) tablet 500 mg       Note to Pharmacy: Cipro 500 mg at 9 pm on 01/21/2021   500 mg Oral As directed 01/21/21 1657 01/21/21 2115   01/21/21 2100  metroNIDAZOLE (FLAGYL) tablet 500 mg       Note to Pharmacy: Flagyl oral 500 mg at 9 pm on 01/21/2021 and repeat dose at 11 pm on 01/21/2021   500 mg Oral 2 times daily 01/21/21 1657 01/21/21 2316   01/21/21 2000  neomycin (MYCIFRADIN) tablet 1,000 mg  Status:  Discontinued       See Hyperspace for full Linked Orders Report.   1,000 mg Oral Every hour 01/20/21 1456 01/21/21 1657   01/21/21 2000  erythromycin (E-MYCIN) tablet 1,000 mg  Status:  Discontinued       See Hyperspace for full Linked Orders Report.   1,000 mg Oral Every hour 01/20/21 1456 01/21/21 1657   01/20/21 0827  ceFAZolin (ANCEF) 2-4 GM/100ML-% IVPB       Note to Pharmacy: Georgena Spurling W: cabinet override      01/20/21 0827 01/20/21 2029   01/20/21 0800  ceFAZolin (ANCEF) IVPB 2g/100 mL premix        2 g 200 mL/hr over 30 Minutes Intravenous On call to O.R. 01/20/21 0759 01/20/21 1035        Assessment/Plan  Complex adnexal mass   -POD#2 s/p Diagnostic flexible sigmoidoscopy (to confirm integrity of rectum and sigmoid colon), Diagnostic laparoscopy, exploratory laparotomy with thorough inspection of small and large bowel, backfill of bladder, cytoscopy with retrograde pyelogram  and attempted fistulogram (with Dr. Berline Lopes, Dr. Milford Cage, Dr. 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 - post op ileus seems to be resolving, ok to advance diet - We will follow  ID - cefoxitin/ flagyl x 5 days FEN - IVF, CLD VTE - SCDs, ok for chemical dvt prophylaxis from our standpoint Foley - in place per urology  Hypothyroidism   LOS: 4 days    Rosario Adie, MD [facility] Surgery 01/24/2021, 8:19 AM Please  see Amion for pager number during day hours 7:00am-4:30pm

## 2021-01-24 NOTE — Progress Notes (Addendum)
2 Days Post-Op Procedure(s) (LRB): EXPLORATORY LAPAROTOMY (N/A) CYSTOSCOPY WITH RETROGRADE PYELOGRAM;FISTULOGRAM DIAGNOSTIC FLEXIBLE SIGMOIDOSCOPY (N/A) EXPLORATORY LAPAROTOMY (N/A)  Subjective: Patient reports doing much better than yesterday. Pain controlled. Has walked in the hallway multiple times today. Toelrating liquids and soft diet without nausea or emesis. Denies flatus, has had two small bowel movements.  Objective: Vital signs in last 24 hours: Temp:  [97.7 F (36.5 C)-99 F (37.2 C)] 97.7 F (36.5 C) (12/03 1350) Pulse Rate:  [84-107] 85 (12/03 1350) Resp:  [14-18] 14 (12/03 1350) BP: (125-139)/(97-106) 134/106 (12/03 1350) SpO2:  [96 %-99 %] 99 % (12/03 1350) Last BM Date: 01/21/21  Intake/Output from previous day: 12/02 0701 - 12/03 0700 In: 3196.9 [P.O.:1360; I.V.:1436.8; IV Piggyback:400.1] Out: 3265 [Urine:2900; Drains:365]  Physical Examination: Gen: NAD CV: RRR, no m/r/g Pulm: CTAB, no wheezes or rhonchi Abd: moderately distended, hypoactive BS, appropriately ttp, incisions with dermabond; midline lap incision with honeycomb dressing (some dried blood at inferior aspect) in place; drains with mostly serous fluid Foley: red/orange urine in bag Ext: trace edema bilaterally, no calf ttp  Labs: CBC    Component Value Date/Time   WBC 10.3 01/24/2021 0828   RBC 3.43 (L) 01/24/2021 0828   HGB 11.7 (L) 01/24/2021 0828   HCT 36.7 01/24/2021 0828   PLT 255 01/24/2021 0828   MCV 107.0 (H) 01/24/2021 0828   MCH 34.1 (H) 01/24/2021 0828   MCHC 31.9 01/24/2021 0828   RDW 13.7 01/24/2021 0828   LYMPHSABS 0.8 01/23/2021 0459   MONOABS 0.7 01/23/2021 0459   EOSABS 0.0 01/23/2021 0459   BASOSABS 0.1 01/23/2021 0459   BMP Latest Ref Rng & Units 01/24/2021 01/23/2021 01/22/2021  Glucose 70 - 99 mg/dL 117(H) 142(H) 124(H)  BUN 6 - 20 mg/dL 7 8 6   Creatinine 0.44 - 1.00 mg/dL 0.78 0.85 0.80  Sodium 135 - 145 mmol/L 132(L) 134(L) 133(L)  Potassium 3.5 - 5.1 mmol/L  3.7 3.8 4.2  Chloride 98 - 111 mmol/L 102 105 104  CO2 22 - 32 mmol/L 23 24 21(L)  Calcium 8.9 - 10.3 mg/dL 8.1(L) 8.2(L) 7.8(L)   Assessment:  45 y.o. 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  Meeting most postoperative milestones. Pain well controlled on PO medications. Somewhat volume up for hospital stay, diuresing well, IVFs down to 25cc, will dc tomorrow am. Drains with significantly less output, serous. Will plan to dc tomorrow vs Monday pending further decrease in output. Antibiotics discontinued yesterday evening after 24 hour course.  Bowel function is returning. High risk for postoperative ileus but she is having bowel function and tolerating diet. Will progress slowly.  Discussed CT findings again with her and MRI. Unfortunately, MRI does not clarify all details of the anatomy but I suspect that bladder fistula involves the cervix/uterus and left adnexal mass.  Will discuss with Urology prior to discharge plan for catheter.   DVT ppx with lovenox, ambulation.  Plan: Dispo: likely Monday or Tuesday The patient is to be discharged to home.   LOS: 4 days    Brittney Herrera 01/24/2021, 4:49 PM

## 2021-01-25 LAB — BASIC METABOLIC PANEL
Anion gap: 3 — ABNORMAL LOW (ref 5–15)
BUN: 6 mg/dL (ref 6–20)
CO2: 23 mmol/L (ref 22–32)
Calcium: 7.7 mg/dL — ABNORMAL LOW (ref 8.9–10.3)
Chloride: 104 mmol/L (ref 98–111)
Creatinine, Ser: 0.75 mg/dL (ref 0.44–1.00)
GFR, Estimated: >60 mL/min (ref >60)
Glucose, Bld: 151 mg/dL — ABNORMAL HIGH (ref 70–99)
Potassium: 3.9 mmol/L (ref 3.5–5.1)
Sodium: 130 mmol/L — ABNORMAL LOW (ref 135–145)

## 2021-01-25 LAB — CBC
HCT: 34.3 % — ABNORMAL LOW (ref 36.0–46.0)
Hemoglobin: 11.3 g/dL — ABNORMAL LOW (ref 12.0–15.0)
MCH: 34.5 pg — ABNORMAL HIGH (ref 26.0–34.0)
MCHC: 32.9 g/dL (ref 30.0–36.0)
MCV: 104.6 fL — ABNORMAL HIGH (ref 80.0–100.0)
Platelets: 285 10*3/uL (ref 150–400)
RBC: 3.28 MIL/uL — ABNORMAL LOW (ref 3.87–5.11)
RDW: 13.7 % (ref 11.5–15.5)
WBC: 11.2 10*3/uL — ABNORMAL HIGH (ref 4.0–10.5)
nRBC: 0 % (ref 0.0–0.2)

## 2021-01-25 MED ORDER — ENSURE ENLIVE PO LIQD
237.0000 mL | Freq: Two times a day (BID) | ORAL | Status: DC
  Administered 2021-01-25 – 2021-01-26 (×3): 237 mL via ORAL

## 2021-01-25 NOTE — Progress Notes (Signed)
Patient ID: Terianne Thaker, female   DOB: Jul 16, 1975, 45 y.o.   MRN: [id number] [facility] Progress Note  3 Days Post-Op  Subjective: CC-  Having less pain. Feeling better.  Tolerating a diet.  Feels that she will be ready for discharge by tomorrow  Objective: Vital signs in last 24 hours: Temp:  [97.7 F (36.5 C)-98.8 F (37.1 C)] 98 F (36.7 C) (12/04 0447) Pulse Rate:  [85-91] 85 (12/04 0447) Resp:  [14-16] 16 (12/03 2109) BP: (131-143)/(94-106) 143/94 (12/04 0447) SpO2:  [98 %-100 %] 98 % (12/04 0447) Last BM Date: 01/24/21  Intake/Output from previous day: 12/03 0701 - 12/04 0700 In: 1560 [P.O.:1200; I.V.:360] Out: 0102 [Urine:3050; Drains:105] Intake/Output this shift: No intake/output data recorded.  PE: Abd: Soft, min distention, appropriately tender, incisions C/D/I with honeycomb dressing in place over midline incisoin, drains with serosanguinous drainage,    Lab Results:  Recent Labs    01/24/21 0828 01/25/21 0635  WBC 10.3 11.2*  HGB 11.7* 11.3*  HCT 36.7 34.3*  PLT 255 285    BMET Recent Labs    01/24/21 0828 01/25/21 0635  NA 132* 130*  K 3.7 3.9  CL 102 104  CO2 23 23  GLUCOSE 117* 151*  BUN 7 6  CREATININE 0.78 0.75  CALCIUM 8.1* 7.7*    PT/INR No results for input(s): LABPROT, INR in the last 72 hours. CMP     Component Value Date/Time   NA 130 (L) 01/25/2021 0635   K 3.9 01/25/2021 0635   CL 104 01/25/2021 0635   CO2 23 01/25/2021 0635   GLUCOSE 151 (H) 01/25/2021 0635   BUN 6 01/25/2021 0635   CREATININE 0.75 01/25/2021 0635   CALCIUM 7.7 (L) 01/25/2021 0635   PROT 5.4 (L) 01/22/2021 0412   ALBUMIN 2.4 (L) 01/22/2021 0412   AST 16 01/22/2021 0412   ALT 19 01/22/2021 0412   ALKPHOS 59 01/22/2021 0412   BILITOT 1.0 01/22/2021 0412   GFRNONAA >60 01/25/2021 0635   Lipase  No results found for: LIPASE     Studies/Results: MR PELVIS W WO CONTRAST  Result Date: 01/24/2021 CLINICAL DATA:  Adnexal mass,  bladder fistula EXAM: MRI PELVIS WITHOUT AND WITH CONTRAST TECHNIQUE: Multiplanar multisequence MR imaging of the pelvis was performed both before and after administration of intravenous contrast. CONTRAST:  17mL GADAVIST GADOBUTROL 1 MMOL/ML IV SOLN COMPARISON:  CT abdomen/pelvis dated 01/23/2021 FINDINGS: Urinary Tract: Bladder with indwelling Foley catheter and nondependent gas. 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 Bowel: Dilated loops of small bowel in the lower abdomen, favoring adynamic small bowel ileus in the setting of recent surgery. Mild rectosigmoid colonic wall thickening, suggesting infectious/inflammatory colitis, better evaluated on CT. Vascular/Lymphatic: No evidence of aneurysm. No suspicious pelvic lymphadenopathy. Reproductive:  Uterus and right ovary are unremarkable. 6.6 x 4.4 cm multiloculated cystic lesion in the left ovary/adnexa, with enhancing septations following contrast administration (series 24/image 42). When correlating with prior CT, there was high density/contrast within the lesion (series 2/image 82 on CT), suggesting of this is likely related to the patient's known bladder fistula. Other:  Pelvic surgical drain, better evaluated on CT. Small volume mesenteric pelvic ascites. Musculoskeletal: No focal osseous lesions. IMPRESSION: 6.6 cm multiloculated cystic lesion in the left ovary/adnexa. Given the high density within this lesion on prior CT, when correlating with the operative report, this is likely secondary to communication with the patient's known bladder fistula. However, the fistulous track itself is not evident  on MR. Bladder with indwelling Foley catheter and nondependent gas. Mild rectosigmoid colonic wall thickening, better evaluated on CT, unchanged. Dilated loops of small bowel in the lower abdomen, favoring adynamic small bowel ileus in the setting of recent surgery. Electronically Signed   By: Julian Hy M.D.   On: 01/24/2021 01:33   CT  ABDOMEN PELVIS W CONTRAST  Result Date: 01/23/2021 CLINICAL DATA:  Abdominal pain. Peritonitis. Bladder fistula. Postop day 1 status post exploratory laparotomy. EXAM: CT ABDOMEN AND PELVIS WITH CONTRAST TECHNIQUE: Multidetector CT imaging of the abdomen and pelvis was performed using the standard protocol following bolus administration of intravenous contrast. CONTRAST:  46mL OMNIPAQUE IOHEXOL 350 MG/ML SOLN COMPARISON:  None. FINDINGS: Lower Chest: Right lower lobe subsegmental atelectasis. Hepatobiliary: No hepatic masses identified. Moderate diffuse steatosis. Gallbladder is unremarkable. No evidence of biliary ductal dilatation. Pancreas:  No mass or inflammatory changes. Spleen: Within normal limits in size and appearance. Adrenals/Urinary Tract: 2 mm calculus in midpole of left kidney. Tiny sub-cm cyst in lower pole of left kidney. No masses identified. No evidence of ureteral calculi or hydronephrosis. Foley catheter within urinary bladder which is nearly completely empty. Stomach/Bowel: Postop changes are seen from recent laparotomy, with small amount of residual free air. Surgical drains are seen in both lower quadrants. Moderately dilated small bowel loops are seen containing air-fluid levels. Moderate small bowel wall thickening is seen in the anterior pelvis, with an apparent transition point at this location causing partial small bowel obstruction. Adjacent soft tissue stranding is seen in the central pelvis, and there is also moderate wall thickening of the sigmoid colon. No abscess identified. Vascular/Lymphatic: Shotty lymph nodes are seen throughout the abdominal retroperitoneum and pelvis measuring less than 1 cm in size. These are likely reactive in etiology. No acute vascular findings. Reproductive: Septate or bicornuate uterus is noted. A complex cystic lesion is seen in the left adnexa which measures 6.2 x 5.2 cm. This could represent abscess or endometriosis, with cystic ovarian neoplasm  considered less likely. Other:  None. Musculoskeletal:  No suspicious bone lesions identified. IMPRESSION: Postop changes from recent laparotomy, with small amount of residual free air. Moderate small bowel wall thickening in the anterior pelvis, with proximal dilatation and apparent transition point at this location. This is suspicious for partial small bowel obstruction. Moderate wall thickening of the sigmoid colon, consistent with colitis. Differential diagnosis includes diverticulitis and other causes of colitis. 6.2 cm complex cystic lesion in left adnexa. Differential diagnosis includes tubo-ovarian abscess, diverticular abscess, endometriosis, or less likely cystic ovarian neoplasm. Moderate hepatic steatosis. Tiny nonobstructing left renal calculus. Electronically Signed   By: Marlaine Hind M.D.   On: 01/23/2021 15:15    Anti-infectives: Anti-infectives (From admission, onward)    Start     Dose/Rate Route Frequency Ordered Stop   01/22/21 2200  metroNIDAZOLE (FLAGYL) IVPB 500 mg        500 mg 100 mL/hr over 60 Minutes Intravenous Every 12 hours 01/22/21 1713 01/23/21 1023   01/22/21 2100  cefOXitin (MEFOXIN) 2 g in sodium chloride 0.9 % 100 mL IVPB        2 g 200 mL/hr over 30 Minutes Intravenous Every 6 hours 01/22/21 1713 01/23/21 1816   01/22/21 1430  cefOXitin (MEFOXIN) 2 g in sodium chloride 0.9 % 100 mL IVPB  Status:  Discontinued        2 g 200 mL/hr over 30 Minutes Intravenous On call to O.R. 01/22/21 1419 01/22/21 1658   01/22/21 1230  metroNIDAZOLE (FLAGYL) IVPB 500 mg        500 mg 100 mL/hr over 60 Minutes Intravenous  Once 01/22/21 1218 01/22/21 1252   01/22/21 1220  metroNIDAZOLE (FLAGYL) 500 MG/100ML IVPB       Note to Pharmacy: Eulas Post, April W: cabinet override      01/22/21 1220 01/22/21 1222   01/22/21 0600  cefOXitin (MEFOXIN) 2 g in sodium chloride 0.9 % 100 mL IVPB        2 g 200 mL/hr over 30 Minutes Intravenous On call to O.R. 01/21/21 1108 01/22/21 1500    01/22/21 0000  erythromycin (E-MYCIN) tablet 1,000 mg  Status:  Discontinued       See Hyperspace for full Linked Orders Report.   1,000 mg Oral Every 6 hours 01/20/21 1456 01/21/21 1657   01/22/21 0000  neomycin (MYCIFRADIN) tablet 1,000 mg  Status:  Discontinued       See Hyperspace for full Linked Orders Report.   1,000 mg Oral  Once 01/20/21 1456 01/21/21 1657   01/21/21 2100  ciprofloxacin (CIPRO) tablet 500 mg       Note to Pharmacy: Cipro 500 mg at 9 pm on 01/21/2021   500 mg Oral As directed 01/21/21 1657 01/21/21 2115   01/21/21 2100  metroNIDAZOLE (FLAGYL) tablet 500 mg       Note to Pharmacy: Flagyl oral 500 mg at 9 pm on 01/21/2021 and repeat dose at 11 pm on 01/21/2021   500 mg Oral 2 times daily 01/21/21 1657 01/21/21 2316   01/21/21 2000  neomycin (MYCIFRADIN) tablet 1,000 mg  Status:  Discontinued       See Hyperspace for full Linked Orders Report.   1,000 mg Oral Every hour 01/20/21 1456 01/21/21 1657   01/21/21 2000  erythromycin (E-MYCIN) tablet 1,000 mg  Status:  Discontinued       See Hyperspace for full Linked Orders Report.   1,000 mg Oral Every hour 01/20/21 1456 01/21/21 1657   01/20/21 0827  ceFAZolin (ANCEF) 2-4 GM/100ML-% IVPB       Note to Pharmacy: Georgena Spurling W: cabinet override      01/20/21 0827 01/20/21 2029   01/20/21 0800  ceFAZolin (ANCEF) IVPB 2g/100 mL premix        2 g 200 mL/hr over 30 Minutes Intravenous On call to O.R. 01/20/21 0759 01/20/21 1035        Assessment/Plan  Complex adnexal mass   -POD#3 s/p Diagnostic flexible sigmoidoscopy (to confirm integrity of rectum and sigmoid colon), Diagnostic laparoscopy, exploratory laparotomy with thorough inspection of small and large bowel, backfill of bladder, cytoscopy with retrograde pyelogram and attempted fistulogram (with Dr. Berline Lopes, Dr. Milford Cage, Dr. 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 - post op ileus seems to be resolved, ok to advance diet - We will follow  ID - cefoxitin/ flagyl x 5 days FEN - IVF, soft diet VTE - SCDs, ok for chemical dvt prophylaxis from our standpoint Foley - in place per urology  Hypothyroidism   LOS: 5 days    Rosario Adie, MD [facility] Surgery 01/25/2021, 8:38 AM Please see Amion for pager number during day hours 7:00am-4:30pm

## 2021-01-25 NOTE — Progress Notes (Signed)
3 Days Post-Op Procedure(s) (LRB): EXPLORATORY LAPAROTOMY (N/A) CYSTOSCOPY WITH RETROGRADE PYELOGRAM;FISTULOGRAM DIAGNOSTIC FLEXIBLE SIGMOIDOSCOPY (N/A) EXPLORATORY LAPAROTOMY (N/A)  Subjective: Patient reports continuing to feel better. Sore, but less abdominal pain. + BM this morning, small, and not having some flatus. Tolerating diet, no nausea or emesis. Walked yesterday, spending most of time out of bed.  Objective: Vital signs in last 24 hours: Temp:  [97.7 F (36.5 C)-98.8 F (37.1 C)] 98 F (36.7 C) (12/04 0447) Pulse Rate:  [85-91] 85 (12/04 0447) Resp:  [14-16] 16 (12/03 2109) BP: (131-143)/(94-106) 143/94 (12/04 0447) SpO2:  [98 %-100 %] 98 % (12/04 0447) Last BM Date: 01/24/21  Intake/Output from previous day: 12/03 0701 - 12/04 0700 In: 1560 [P.O.:1200; I.V.:360] Out: 8295 [Urine:3050; Drains:105]  Physical Examination: Gen: NAD CV: RRR, no m/r/g Pulm: CTAB, no wheezes or rhonchi Abd: moderately distended, hypoactive BS but improving, appropriately ttp, incisions with dermabond; honey comb dressing removed - approximately 4-6 cm of the mid portion of the incision has superficially opened and is without exudate or erythema (this was packed with wet to dry dressing; drains with mostly serous fluid (10cc in each emptied); LLQ drain removed.  Foley: orange urine in bag Ext: trace edema bilaterally, no calf ttp  Labs: CBC    Component Value Date/Time   WBC 11.2 (H) 01/25/2021 0635   RBC 3.28 (L) 01/25/2021 0635   HGB 11.3 (L) 01/25/2021 0635   HCT 34.3 (L) 01/25/2021 0635   PLT 285 01/25/2021 0635   MCV 104.6 (H) 01/25/2021 0635   MCH 34.5 (H) 01/25/2021 0635   MCHC 32.9 01/25/2021 0635   RDW 13.7 01/25/2021 0635   LYMPHSABS 0.8 01/23/2021 0459   MONOABS 0.7 01/23/2021 0459   EOSABS 0.0 01/23/2021 0459   BASOSABS 0.1 01/23/2021 0459   BMP Latest Ref Rng & Units 01/25/2021 01/24/2021 01/23/2021  Glucose 70 - 99 mg/dL 151(H) 117(H) 142(H)  BUN 6 - 20 mg/dL 6 7  8   Creatinine 0.44 - 1.00 mg/dL 0.75 0.78 0.85  Sodium 135 - 145 mmol/L 130(L) 132(L) 134(L)  Potassium 3.5 - 5.1 mmol/L 3.9 3.7 3.8  Chloride 98 - 111 mmol/L 104 102 105  CO2 22 - 32 mmol/L 23 23 24   Calcium 8.9 - 10.3 mg/dL 7.7(L) 8.1(L) 8.2(L)    Assessment:  45 y.o. 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   Meeting most postoperative milestones. Pain continues to be well controlled on PO medications. IVFs discontinued this morning. Drains with significantly less output, serous; minimal from left drain so this was removed today. Will plan to dc right pelvic drain tomorrow if minimal output. Antibiotics discontinued Friday after 24 hour course. Mild leukocytosis this morning, suspect in the setting of inflammation - no subjective symptoms of infection nor other objective signs. Will monitor closely.   Bowel function continues to improve. High risk for postoperative ileus but she is having bowel function and tolerating diet. On regular diet now.  Mild asymptomatic hyponatremia. Suspect will correct with fluid shifts. Will continue to trend.   Will discuss with Urology prior to discharge plan for catheter. Suspect may discharge her home with catheter for short course and plan for removal outpatient.    DVT ppx with lovenox, ambulation.  Plan: Dispo: likely Monday or Tuesday The patient is to be discharged to home.   LOS: 5  days    Lafonda Mosses 01/25/2021, 9:54 AM

## 2021-01-26 ENCOUNTER — Inpatient Hospital Stay (HOSPITAL_COMMUNITY): Payer: BC Managed Care – PPO

## 2021-01-26 LAB — BASIC METABOLIC PANEL
Anion gap: 6 (ref 5–15)
BUN: 6 mg/dL (ref 6–20)
CO2: 24 mmol/L (ref 22–32)
Calcium: 8 mg/dL — ABNORMAL LOW (ref 8.9–10.3)
Chloride: 102 mmol/L (ref 98–111)
Creatinine, Ser: 0.67 mg/dL (ref 0.44–1.00)
GFR, Estimated: >60 mL/min (ref >60)
Glucose, Bld: 87 mg/dL (ref 70–99)
Potassium: 4 mmol/L (ref 3.5–5.1)
Sodium: 132 mmol/L — ABNORMAL LOW (ref 135–145)

## 2021-01-26 LAB — CBC
HCT: 35 % — ABNORMAL LOW (ref 36.0–46.0)
Hemoglobin: 11.3 g/dL — ABNORMAL LOW (ref 12.0–15.0)
MCH: 33.9 pg (ref 26.0–34.0)
MCHC: 32.3 g/dL (ref 30.0–36.0)
MCV: 105.1 fL — ABNORMAL HIGH (ref 80.0–100.0)
Platelets: 341 10*3/uL (ref 150–400)
RBC: 3.33 MIL/uL — ABNORMAL LOW (ref 3.87–5.11)
RDW: 13.5 % (ref 11.5–15.5)
WBC: 9.5 10*3/uL (ref 4.0–10.5)
nRBC: 0 % (ref 0.0–0.2)

## 2021-01-26 IMAGING — DX DG ABDOMEN 1V
1 series · 1 of 1 positions shown · non-contrast
Comparison: CT [DATE]

CLINICAL DATA: NG tube

EXAM:
ABDOMEN - 1 VIEW

[abdomen kub]
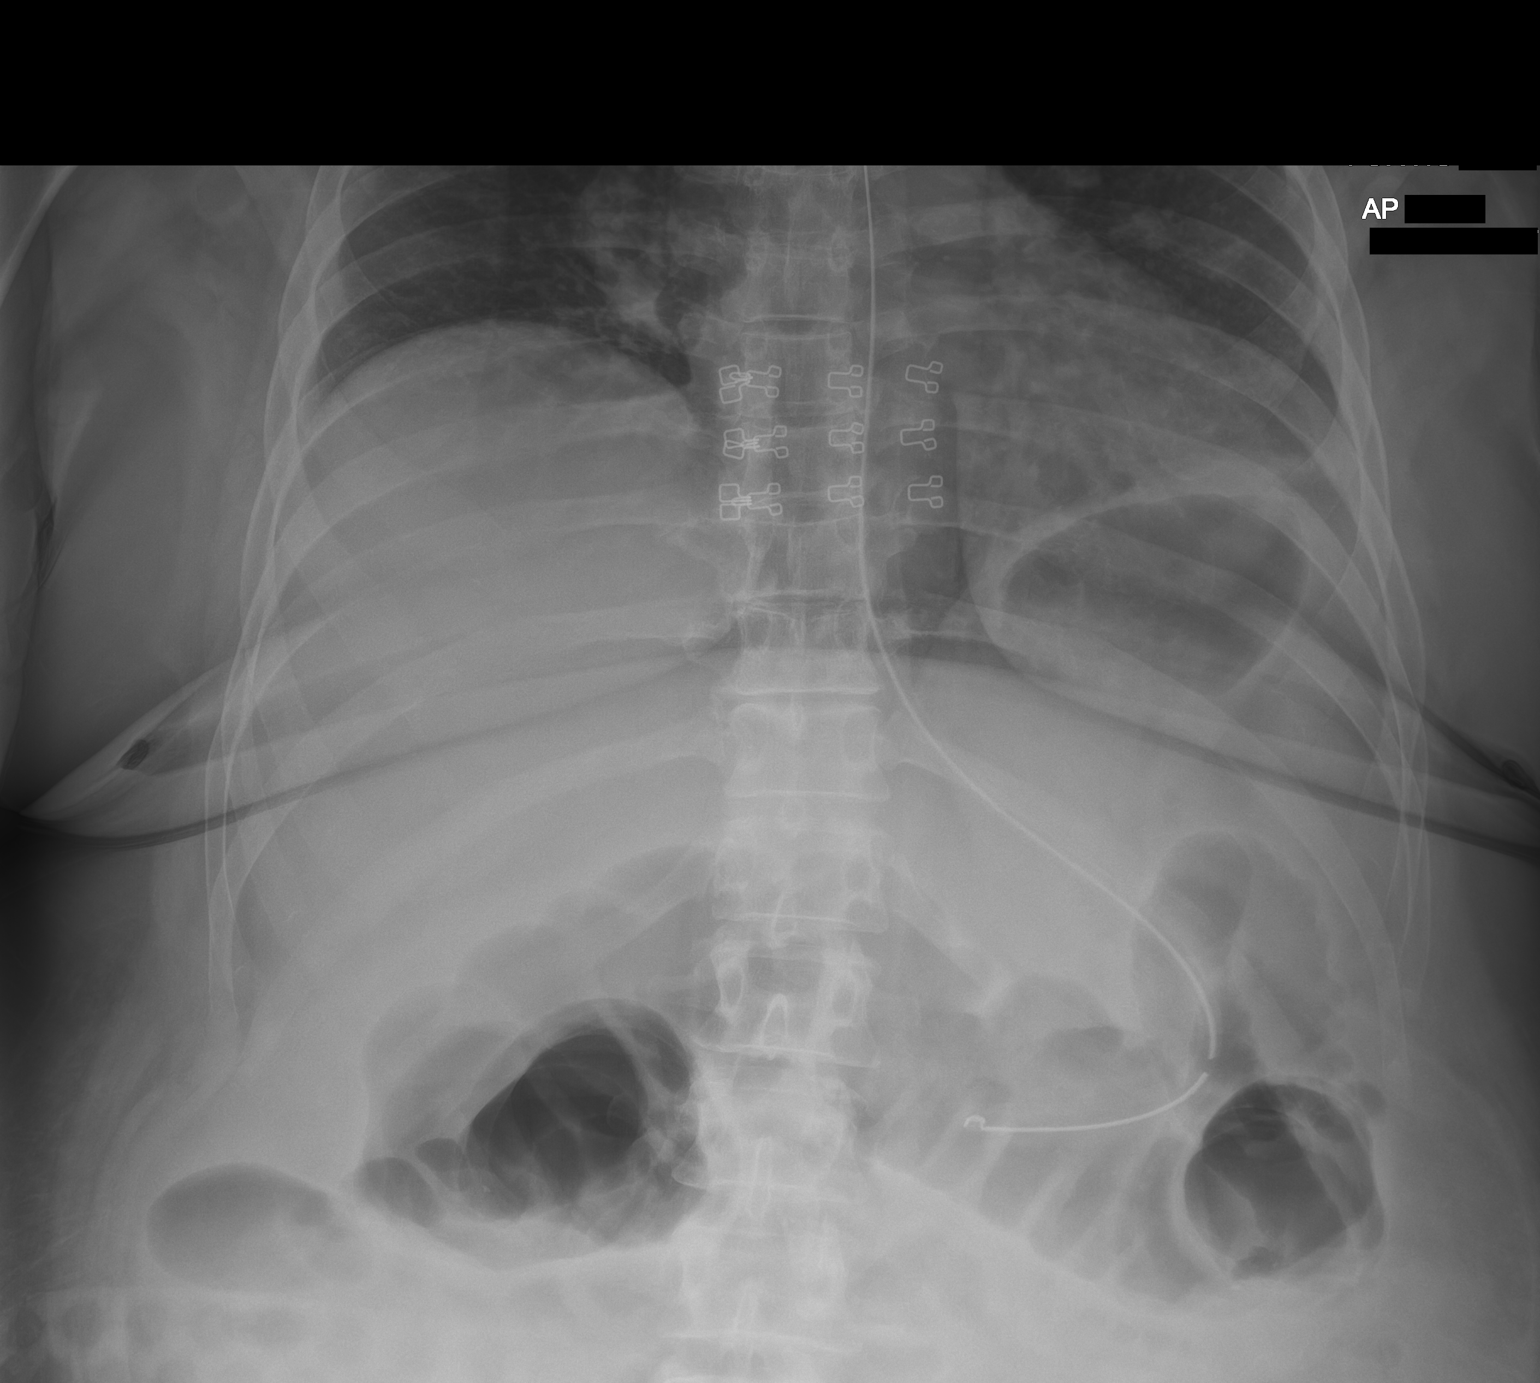

[1 of 1 positions shown; findings below may reference images not displayed]

FINDINGS: Esophageal tube tip and side port overlie the body of the stomach.
Mild air distension of bowel in the upper abdomen.
IMPRESSION: Esophageal tube tip overlies the body of the stomach

## 2021-01-26 MED ORDER — LORAZEPAM 2 MG/ML IJ SOLN
0.5000 mg | Freq: Once | INTRAMUSCULAR | Status: AC
  Administered 2021-01-26: 0.5 mg via INTRAVENOUS
  Filled 2021-01-26: qty 1

## 2021-01-26 MED ORDER — KCL IN DEXTROSE-NACL 20-5-0.45 MEQ/L-%-% IV SOLN
INTRAVENOUS | Status: DC
  Filled 2021-01-26 (×5): qty 1000

## 2021-01-26 MED ORDER — LORAZEPAM 2 MG/ML IJ SOLN
0.5000 mg | Freq: Four times a day (QID) | INTRAMUSCULAR | Status: DC | PRN
  Administered 2021-01-26 – 2021-01-27 (×2): 0.5 mg via INTRAVENOUS
  Filled 2021-01-26 (×2): qty 1

## 2021-01-26 MED ORDER — PANTOPRAZOLE SODIUM 40 MG IV SOLR
40.0000 mg | INTRAVENOUS | Status: DC
  Administered 2021-01-26 – 2021-01-27 (×2): 40 mg via INTRAVENOUS
  Filled 2021-01-26 (×2): qty 40

## 2021-01-26 MED ORDER — SIMETHICONE 80 MG PO CHEW
80.0000 mg | CHEWABLE_TABLET | Freq: Four times a day (QID) | ORAL | Status: DC | PRN
  Administered 2021-01-26 – 2021-01-27 (×2): 80 mg via ORAL
  Filled 2021-01-26 (×2): qty 1

## 2021-01-26 NOTE — Progress Notes (Signed)
4 Days Post-Op  Subjective: CC: Patient reports that her abdomen feels distended/tight but also improved from yesterday. Some soreness around her incisions. Tolerating her diet (had a few bites of pancakes for breakfast + all of a pudding and fruit; veggie broth and a few bites of egg salad for dinner; has not had breakfast) without n/v. Passing flatus. Had a large loose bm this morning. Mobilizing. Foley in place. JP drain x 1 removed yesterday.   Objective: Vital signs in last 24 hours: Temp:  [97.5 F (36.4 C)-98.2 F (36.8 C)] 97.5 F (36.4 C) (12/05 0532) Pulse Rate:  [74-89] 74 (12/05 0532) Resp:  [18] 18 (12/05 0532) BP: (120-140)/(88-100) 140/100 (12/05 0532) SpO2:  [97 %-98 %] 98 % (12/05 0532) Last BM Date: 01/24/21  Intake/Output from previous day: 12/04 0701 - 12/05 0700 In: 49 [P.O.:490] Out: 2350 [Urine:2350] Intake/Output this shift: Total I/O In: 120 [P.O.:120] Out: 350 [Urine:350]  PE: Gen:  Alert, NAD, pleasant Pulm: Rate and efffort normal Abd: Soft, mild distension, appropriately tender, +BS, laparoscopic incisions c/d/I. Midline wound as noted below. Skin open with granulation tissue at the base on the mid/inferior aspect w/ a few staples remaining on the most inferior aspect of the wound.  No signs of infection. R drain ss. L drain site dressed.  Foley - in place w/ transparent yellow urine in foley bag Psych: A&Ox3     Lab Results:  Recent Labs    01/25/21 0635 01/26/21 0430  WBC 11.2* 9.5  HGB 11.3* 11.3*  HCT 34.3* 35.0*  PLT 285 341   BMET Recent Labs    01/25/21 0635 01/26/21 0430  NA 130* 132*  K 3.9 4.0  CL 104 102  CO2 23 24  GLUCOSE 151* 87  BUN 6 6  CREATININE 0.75 0.67  CALCIUM 7.7* 8.0*   PT/INR No results for input(s): LABPROT, INR in the last 72 hours. CMP     Component Value Date/Time   NA 132 (L) 01/26/2021 0430   K 4.0 01/26/2021 0430   CL 102 01/26/2021 0430   CO2 24 01/26/2021 0430   GLUCOSE 87  01/26/2021 0430   BUN 6 01/26/2021 0430   CREATININE 0.67 01/26/2021 0430   CALCIUM 8.0 (L) 01/26/2021 0430   PROT 5.4 (L) 01/22/2021 0412   ALBUMIN 2.4 (L) 01/22/2021 0412   AST 16 01/22/2021 0412   ALT 19 01/22/2021 0412   ALKPHOS 59 01/22/2021 0412   BILITOT 1.0 01/22/2021 0412   GFRNONAA >60 01/26/2021 0430   Lipase  No results found for: LIPASE  Studies/Results: No results found.  Anti-infectives: Anti-infectives (From admission, onward)    Start     Dose/Rate Route Frequency Ordered Stop   01/22/21 2200  metroNIDAZOLE (FLAGYL) IVPB 500 mg        500 mg 100 mL/hr over 60 Minutes Intravenous Every 12 hours 01/22/21 1713 01/23/21 1023   01/22/21 2100  cefOXitin (MEFOXIN) 2 g in sodium chloride 0.9 % 100 mL IVPB        2 g 200 mL/hr over 30 Minutes Intravenous Every 6 hours 01/22/21 1713 01/23/21 1816   01/22/21 1430  cefOXitin (MEFOXIN) 2 g in sodium chloride 0.9 % 100 mL IVPB  Status:  Discontinued        2 g 200 mL/hr over 30 Minutes Intravenous On call to O.R. 01/22/21 1419 01/22/21 1658   01/22/21 1230  metroNIDAZOLE (FLAGYL) IVPB 500 mg        500 mg 100  mL/hr over 60 Minutes Intravenous  Once 01/22/21 1218 01/22/21 1252   01/22/21 1220  metroNIDAZOLE (FLAGYL) 500 MG/100ML IVPB       Note to Pharmacy: Eulas Post, April W: cabinet override      01/22/21 1220 01/22/21 1222   01/22/21 0600  cefOXitin (MEFOXIN) 2 g in sodium chloride 0.9 % 100 mL IVPB        2 g 200 mL/hr over 30 Minutes Intravenous On call to O.R. 01/21/21 1108 01/22/21 1500   01/22/21 0000  erythromycin (E-MYCIN) tablet 1,000 mg  Status:  Discontinued       See Hyperspace for full Linked Orders Report.   1,000 mg Oral Every 6 hours 01/20/21 1456 01/21/21 1657   01/22/21 0000  neomycin (MYCIFRADIN) tablet 1,000 mg  Status:  Discontinued       See Hyperspace for full Linked Orders Report.   1,000 mg Oral  Once 01/20/21 1456 01/21/21 1657   01/21/21 2100  ciprofloxacin (CIPRO) tablet 500 mg       Note to  Pharmacy: Cipro 500 mg at 9 pm on 01/21/2021   500 mg Oral As directed 01/21/21 1657 01/21/21 2115   01/21/21 2100  metroNIDAZOLE (FLAGYL) tablet 500 mg       Note to Pharmacy: Flagyl oral 500 mg at 9 pm on 01/21/2021 and repeat dose at 11 pm on 01/21/2021   500 mg Oral 2 times daily 01/21/21 1657 01/21/21 2316   01/21/21 2000  neomycin (MYCIFRADIN) tablet 1,000 mg  Status:  Discontinued       See Hyperspace for full Linked Orders Report.   1,000 mg Oral Every hour 01/20/21 1456 01/21/21 1657   01/21/21 2000  erythromycin (E-MYCIN) tablet 1,000 mg  Status:  Discontinued       See Hyperspace for full Linked Orders Report.   1,000 mg Oral Every hour 01/20/21 1456 01/21/21 1657   01/20/21 0827  ceFAZolin (ANCEF) 2-4 GM/100ML-% IVPB       Note to Pharmacy: Georgena Spurling W: cabinet override      01/20/21 0827 01/20/21 2029   01/20/21 0800  ceFAZolin (ANCEF) IVPB 2g/100 mL premix        2 g 200 mL/hr over 30 Minutes Intravenous On call to O.R. 01/20/21 0759 01/20/21 1035        Assessment/Plan Complex adnexal mass   -POD#4 s/p Diagnostic flexible sigmoidoscopy (to confirm integrity of rectum and sigmoid colon), Diagnostic laparoscopy, exploratory laparotomy with thorough inspection of small and large bowel, backfill of bladder, cytoscopy with retrograde pyelogram and attempted fistulogram (with Dr. Berline Lopes, Dr. Milford Cage, Dr. 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 L drain removed yesterday. R drain serous. Per chart review, appears primary plans to remove R drain today.  - Ileus improved. Tolerating diet and having bowel function.  - WTD BID for open portion of midline. Does have a few staples on inferior aspect of wound.  - Foley per primary and urology - We will follow. Timing of d/c per primary     ID - cefoxitin/ flagyl x 5 days completed FEN - IVF, Reg diet VTE - SCDs, Lovenox Foley - in place per urology   Hypothyroidism   LOS: 6 days    Jillyn Ledger , [facility] Surgery 01/26/2021, 9:16 AM Please see  Amion for pager number during day hours 7:00am-4:30pm

## 2021-01-26 NOTE — Progress Notes (Signed)
4 Days Post-Op Procedure(s) (LRB): EXPLORATORY LAPAROTOMY (N/A) CYSTOSCOPY WITH RETROGRADE PYELOGRAM;FISTULOGRAM DIAGNOSTIC FLEXIBLE SIGMOIDOSCOPY (N/A) EXPLORATORY LAPAROTOMY (N/A)  Subjective: Patient reports having moderate abdominal pain related to gas pains. She had 2 bowel movements this am with flatus at that time but none since. No nausea or emesis reported but does not want to eat due to gas pains. Her abdomen feels tight Voiding without difficulty. There has been barely no output from the JP drain over the past 24 hours. Having vaginal bleeding similar to a light period. Abdomen feels full and tight. Mother at the bedside. All questions answered.  Objective: Vital signs in last 24 hours: Temp:  [97.5 F (36.4 C)-98.2 F (36.8 C)] 97.5 F (36.4 C) (12/05 0532) Pulse Rate:  [74-89] 74 (12/05 0532) Resp:  [18] 18 (12/05 0532) BP: (120-140)/(88-100) 140/100 (12/05 0532) SpO2:  [97 %-98 %] 98 % (12/05 0532) Last BM Date: 01/24/21  Intake/Output from previous day: 12/04 0701 - 12/05 0700 In: 45 [P.O.:490] Out: 2350 [Urine:2350]  Physical Examination: General: alert, cooperative, moderate distress, and appears uncomfortable due to abdominal tightness Resp: clear to auscultation bilaterally Cardio: regular rate and rhythm, S1, S2 normal, no murmur, click, rub or gallop GI: incision: Lap site incisions with dermabond intact with no drainage and midline incision with dry dressing in place. Abdomen distended, firm. Tenderness with palpation and percussion, tinkling bowel sounds in the left upper quadrant abdomen, hypoactive in other quadrants.  Extremities: extremities normal, atraumatic, no cyanosis or edema JP drain on the right abdomen charged with minimal amount of serous drainage in bulb  Labs: WBC/Hgb/Hct/Plts:  9.5/11.3/35.0/341 (12/05 0430) BUN/Cr/glu/ALT/AST/amyl/lip:  6/0.67/--/--/--/--/-- (12/05 0430)  Assessment: 45 y.o. s/p Procedure(s): 01/20/2021: XI ROBOTIC  ASSISTED RIGHT  SALPINGECTOMY, LEFT OVARIAN BIOPSY (Right), DILATATION & CURETTAGE/HYSTEROSCOPY WITH MYOSURE (N/A) 01/22/2021: Diagnostic laparoscopy, exploratory laparotomy with thorough inspection of small and large bowel (with Dr. Dema Severin), backfill of bladder, cytoscopy with retrograde pyelogram and attempted fistulogram (with Dr. Milford Cage)   Pain:  Pain is controlled with oral medications but needing medication for gas-related abdominal discomfort.  Heme: Hgb 11.3 and Hct 35.0 this am-stable compared with previous values, surgical losses.  ID: WBC count 9.5 this am. Afebrile. Continue to monitor. Was on cefoxitin and flagyl IV post-operatively x 24 hours starting 01/22/21.  CV: Tachycardic improved. Continue to monitor with ordered vital signs.  GI:  Tolerating po: yes, decreased due to gas pains. High risk for post-op ileus.   GU: Voiding since foley removal. Creatinine 0.67. Urine culture from 01/20/21 with no growth. 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  FEN: No critical values reported.  Endo: Glucose has been stable. Continue to monitor on ordered labs.    Prophylaxis: SCDs, Lovenox ordered.  Plan: Foley removed this am. No increase in output in JP drain at this time. Simethicone ordered for gas related abdominal pain. Plan to follow up with patient later this afternoon to re-evaluate her symptoms. Continue plan of care per Dr. Berline Lopes.   LOS: 6 days    Brittney Herrera 01/26/2021, 1:31 PM

## 2021-01-27 DIAGNOSIS — K9189 Other postprocedural complications and disorders of digestive system: Secondary | ICD-10-CM

## 2021-01-27 LAB — COMPREHENSIVE METABOLIC PANEL
ALT: 14 U/L (ref 0–44)
AST: 15 U/L (ref 15–41)
Albumin: 2.7 g/dL — ABNORMAL LOW (ref 3.5–5.0)
Alkaline Phosphatase: 84 U/L (ref 38–126)
Anion gap: 8 (ref 5–15)
BUN: 5 mg/dL — ABNORMAL LOW (ref 6–20)
CO2: 24 mmol/L (ref 22–32)
Calcium: 8.2 mg/dL — ABNORMAL LOW (ref 8.9–10.3)
Chloride: 101 mmol/L (ref 98–111)
Creatinine, Ser: 0.68 mg/dL (ref 0.44–1.00)
GFR, Estimated: >60 mL/min (ref >60)
Glucose, Bld: 109 mg/dL — ABNORMAL HIGH (ref 70–99)
Potassium: 3.8 mmol/L (ref 3.5–5.1)
Sodium: 133 mmol/L — ABNORMAL LOW (ref 135–145)
Total Bilirubin: 0.8 mg/dL (ref 0.3–1.2)
Total Protein: 6.3 g/dL — ABNORMAL LOW (ref 6.5–8.1)

## 2021-01-27 LAB — CBC
HCT: 40.8 % (ref 36.0–46.0)
Hemoglobin: 13.5 g/dL (ref 12.0–15.0)
MCH: 34 pg (ref 26.0–34.0)
MCHC: 33.1 g/dL (ref 30.0–36.0)
MCV: 102.8 fL — ABNORMAL HIGH (ref 80.0–100.0)
Platelets: 448 10*3/uL — ABNORMAL HIGH (ref 150–400)
RBC: 3.97 MIL/uL (ref 3.87–5.11)
RDW: 13.2 % (ref 11.5–15.5)
WBC: 13 10*3/uL — ABNORMAL HIGH (ref 4.0–10.5)
nRBC: 0 % (ref 0.0–0.2)

## 2021-01-27 LAB — DIFFERENTIAL
Abs Immature Granulocytes: 0.28 10*3/uL — ABNORMAL HIGH (ref 0.00–0.07)
Basophils Absolute: 0.1 10*3/uL (ref 0.0–0.1)
Basophils Relative: 1 %
Eosinophils Absolute: 0.2 10*3/uL (ref 0.0–0.5)
Eosinophils Relative: 2 %
Immature Granulocytes: 2 %
Lymphocytes Relative: 9 %
Lymphs Abs: 1.2 10*3/uL (ref 0.7–4.0)
Monocytes Absolute: 0.9 10*3/uL (ref 0.1–1.0)
Monocytes Relative: 7 %
Neutro Abs: 10.3 10*3/uL — ABNORMAL HIGH (ref 1.7–7.7)
Neutrophils Relative %: 79 %

## 2021-01-27 MED ORDER — LABETALOL HCL 5 MG/ML IV SOLN
5.0000 mg | INTRAVENOUS | Status: DC | PRN
  Filled 2021-01-27: qty 4

## 2021-01-27 NOTE — Progress Notes (Signed)
Per NP verbal order remove the ng tube.

## 2021-01-27 NOTE — Progress Notes (Addendum)
5 Days Post-Op Procedure(s) (LRB): EXPLORATORY LAPAROTOMY (N/A) CYSTOSCOPY WITH RETROGRADE PYELOGRAM;FISTULOGRAM DIAGNOSTIC FLEXIBLE SIGMOIDOSCOPY (N/A) EXPLORATORY LAPAROTOMY (N/A)  Subjective: Patient continues to have intermittent gas pains. She had 2 bowel movements 01/26/21 am with flatus at that time but none since. No nausea or emesis reported. Her abdomen feels less distended. Voiding without difficulty. She is reporting moderate dizziness after receiving multiple medications at once. She feels her mobility is limited due to having the NG tube in. Denies chest pain, dyspnea. Kpad is helping with abdominal discomfort. States her abdominal dressing was not changed overnight. No concerns voiced.  Objective: Vital signs in last 24 hours: Temp:  [97.4 F (36.3 C)-98 F (36.7 C)] 97.4 F (36.3 C) (12/06 0641) Pulse Rate:  [87-92] 87 (12/06 0641) Resp:  [18-20] 18 (12/06 0641) BP: (152-158)/(104-110) 152/104 (12/06 0641) SpO2:  [97 %-99 %] 99 % (12/06 0641) Last BM Date: 01/24/21  Intake/Output from previous day: 12/05 0701 - 12/06 0700 In: 1850.3 [P.O.:600; I.V.:1250.3] Out: 3350 [Urine:3050; Emesis/NG output:300]  Physical Examination: General: alert, cooperative, no distress, and intermittent grimace when gas pains present Resp: clear to auscultation bilaterally Cardio: regular rate and rhythm, S1, S2 normal, no murmur, click, rub or gallop GI: incision: Lap site incisions with dermabond intact with no drainage and midline incision with dry dressing in place. Abdomen less distended, less firm from 01/26/2021. Less tenderness with palpation and percussion, less tinkling of bowel sounds, more active throughout the abdomen  Extremities: extremities normal, atraumatic, no cyanosis or edema NG tube off suction due to medication administration. 300 cc of output in canister  Labs: WBC/Hgb/Hct/Plts:  13.0/13.5/40.8/448 (12/06 8270) BUN/Cr/glu/ALT/AST/amyl/lip:  5/0.68/--/14/15/--/--  (12/06 7867)  Assessment: 45 y.o. s/p Procedure(s): 01/20/2021: XI ROBOTIC ASSISTED RIGHT  SALPINGECTOMY, LEFT OVARIAN BIOPSY (Right), DILATATION & CURETTAGE/HYSTEROSCOPY WITH MYOSURE (N/A) 01/22/2021: Diagnostic laparoscopy, exploratory laparotomy with thorough inspection of small and large bowel (with Dr. Dema Severin), backfill of bladder, cytoscopy with retrograde pyelogram and attempted fistulogram (with Dr. Milford Cage)   Pain:  Pain is controlled with prn medications. Using kpad.  Heme: Hgb 13.5 and Hct 40.8 this am-stable compared with previous values, surgical losses.  ID: WBC count 13.0 this am. Afebrile. Continue to monitor. Was on cefoxitin and flagyl IV post-operatively x 24 hours starting 01/22/21.  CV: Tachycardic improved. BP elevated- will order medication. Continue to monitor with ordered vital signs.  GI:  Tolerating po: Npo with NG tube in place due to post-operative ileus.    GU: Voiding with creatinine 0.68 this am. Urine culture from 01/20/21 with no growth. 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  FEN: No critical values reported.  Endo: Glucose has been stable. Continue to monitor on ordered labs.    Prophylaxis: SCDs, Lovenox ordered.  Plan: Updated set of vitals due to dizziness (RN states pt received IV dilaudid and IV ativan together at 07:15 am) Continue NG to intermittent suction until bowel function returns Encouraged ambulation AM labs Continue plan of care per Dr. Berline Lopes.   LOS: 7 days    Brittney Herrera 01/27/2021, 9:26 AM

## 2021-01-27 NOTE — Progress Notes (Signed)
GYN Oncology Progress Note  Patient alert, oriented, resting in bed. 100 cc output since am assessment from NG. She had a BM earlier in the day. Passing some flatus. No significant abdominal pain reported. Abd feels less distended. NG tubing adjusted and extension tubing removed. Plan for NG to remain for the next hour and if there is less than 100 cc of output in that time, the NG tube can be removed. If removed, plan will be for clear liquids. AM labs ordered. Continue plan of care per Dr. Berline Lopes.

## 2021-01-28 LAB — BASIC METABOLIC PANEL
Anion gap: 7 (ref 5–15)
BUN: <5 mg/dL — ABNORMAL LOW (ref 6–20)
CO2: 25 mmol/L (ref 22–32)
Calcium: 8.1 mg/dL — ABNORMAL LOW (ref 8.9–10.3)
Chloride: 101 mmol/L (ref 98–111)
Creatinine, Ser: 0.68 mg/dL (ref 0.44–1.00)
GFR, Estimated: >60 mL/min (ref >60)
Glucose, Bld: 104 mg/dL — ABNORMAL HIGH (ref 70–99)
Potassium: 4 mmol/L (ref 3.5–5.1)
Sodium: 133 mmol/L — ABNORMAL LOW (ref 135–145)

## 2021-01-28 LAB — CBC
HCT: 35.8 % — ABNORMAL LOW (ref 36.0–46.0)
Hemoglobin: 11.7 g/dL — ABNORMAL LOW (ref 12.0–15.0)
MCH: 34 pg (ref 26.0–34.0)
MCHC: 32.7 g/dL (ref 30.0–36.0)
MCV: 104.1 fL — ABNORMAL HIGH (ref 80.0–100.0)
Platelets: 444 10*3/uL — ABNORMAL HIGH (ref 150–400)
RBC: 3.44 MIL/uL — ABNORMAL LOW (ref 3.87–5.11)
RDW: 13.2 % (ref 11.5–15.5)
WBC: 9.5 10*3/uL (ref 4.0–10.5)
nRBC: 0 % (ref 0.0–0.2)

## 2021-01-28 MED ORDER — LORAZEPAM 0.5 MG PO TABS: 0.5000 mg | ORAL_TABLET | Freq: Four times a day (QID) | ORAL | 0 refills | Status: DC | PRN

## 2021-01-28 NOTE — Discharge Instructions (Addendum)
01/28/2021  Return to work: 4-6 weeks if applicable  Plan to change the dressing on your abdomen twice daily. You can moisten a gauze with saline and wring out the gauze to remove excess saline so it is dampened but not saturated. Place this gauze on the open area of the incision and place a dry gauze on top with tape around the edges.  We will plan on seeing you in the office next week as follow up and to remove the remaining staples.  Activity: 1. Be up and out of the bed during the day.  Take a nap if needed.  You may walk up steps but be careful and use the hand rail.  Stair climbing will tire you more than you think, you may need to stop part way and rest.   2. No lifting or straining over 10 lbs, pushing, pulling, straining for 6 weeks.  3. No driving for 2 week(s) from your last surgery.  Do not drive if you are taking narcotic pain medicine. You need to make sure your reaction time has returned and you can brake safely.  4. Shower daily.  Use your regular soap to bathe and when finished pat your incision dry; don't rub.  No tub baths until cleared by your surgeon.   5. No sexual activity and nothing in the vagina for 4 weeks.  6. You may experience a small amount of clear drainage from your incisions, which is normal.  If the drainage persists or increases, please call the office.  7. You may experience vaginal spotting after surgery. The spotting is normal but if you experience heavy bleeding, call our office.  8. Take Tylenol or ibuprofen first for pain and only use narcotic pain medication for severe pain not relieved by the Tylenol or Ibuprofen.  Monitor your Tylenol intake to a max of 4,000 mg.   Diet: 1. Low sodium Heart Healthy Diet is recommended.  2. It is safe to use a laxative, such as Miralax or Colace, if you have difficulty moving your bowels. You can take Sennakot at bedtime every evening to keep bowel movements regular and to prevent constipation.    Wound  Care: 1. Keep clean and dry.  Shower daily.  Reasons to call the Doctor: Fever - Oral temperature greater than 100.4 degrees Fahrenheit Foul-smelling vaginal discharge Difficulty urinating Nausea and vomiting Increased pain at the site of the incision that is unrelieved with pain medicine. Difficulty breathing with or without chest pain New calf pain especially if only on one side Sudden, continuing increased vaginal bleeding with or without clots.   Contacts: For questions or concerns you should contact:  Dr. Jeral Pinch at [phone]  [name], NP at [phone]  After Hours: call [phone] and have the GYN Oncologist paged/contacted

## 2021-01-28 NOTE — Discharge Summary (Signed)
Physician Discharge Summary  Patient ID: Brittney Herrera MRN: [id number] DOB/AGE: [birth year] 45 y.o.  Admit date: 01/20/2021 Discharge date: 01/28/2021  Admission Diagnoses: Pelvic mass in female  Discharge Diagnoses:  Principal Problem:   Pelvic mass in female Active Problems:   Bladder fistula   Bladder spasm   Ileus, postoperative [facility])  Discharged Condition:  The patient is in good condition and stable for discharge.    Hospital Course: On 01/20/2021, the patient was taken to the operating room for a complex adnexal mass and abnormal uterine bleeding. 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 Due to the concern for possible bowel injury and the unanticipated bowel involvement without a bowel prep, the decision was made to stop the procedure and return after adequate bowel prepping. On 01/22/2021, the patient returned to the operating room after having a bowel prep the day before for the following: Diagnostic laparoscopy, exploratory laparotomy with thorough inspection of small and large bowel (with Dr. Dema Severin), backfill of bladder, cytoscopy with retrograde pyelogram and attempted fistulogram (with Dr. Milford Cage). On 01/23/21, she had a CT scan AP followed by an MRI of the pelvis on 01/24/2021 to further evaluate the pelvis and bladder fistula.  The postoperative course included prophylactic IV cefoxitin and flagyl for 24 hours post-op, foley catheter with no difficulties voiding after removal, superficial abdominal incisional separation with twice daily dressing changes,  post-operative ileus requiring NG tube placement on 01/26/2021 with removal the following day in the evening.  She was discharged to home on postoperative day 6 (from 2nd operation) tolerating a regular diet, ambulating, voiding, passing flatus, having bowel movements, pain controlled with oral medications. She was instructed on abdominal incision dressing changes and supplies given. She has follow up in the office at the beginning of next week.   Consults: General Surgery, Urology  Significant Diagnostic Studies: Labs, CT scan, MRI  Treatments: IV hydration, IV antibiotics, Surgery, NG placement  Discharge Exam: Blood pressure (!) 131/98, pulse 81, temperature 98.5 F (36.9 C), temperature source Oral, resp. rate 18, height 5' 2.5" (1.588 m), weight 189 lb 9.5 oz (86 kg), last menstrual period 12/23/2020, SpO2 100 %. General appearance: alert, cooperative, and no distress Resp: clear to auscultation bilaterally Cardio: regular rate and rhythm, S1, S2 normal, no murmur, click, rub or gallop GI: active bowel sounds, abdomen less distended and soft, less tympanic Extremities: trace nonpitting edema bilaterally Incision/Wound: Lap sites to the abdomen with dermabond intact without drainage. Abdominal dressing dry and intact.  Disposition: Discharge disposition: 01-Home or Self Care      Discharge Instructions     Call MD for:  difficulty breathing, headache or visual disturbances   Complete by: As directed    Call MD for:  extreme fatigue   Complete by: As directed    Call MD for:  hives   Complete by: As directed    Call MD for:  persistant dizziness or light-headedness   Complete by: As directed    Call MD for:  persistant nausea and vomiting   Complete by: As directed    Call MD for:  redness, tenderness, or signs of infection (pain, swelling, redness, odor or green/yellow discharge around incision site)  Complete by: As directed    Call MD for:  severe uncontrolled pain    Complete by: As directed    Call MD for:  temperature >100.4   Complete by: As directed    Diet general   Complete by: As directed    Discharge wound care:   Complete by: As directed    Plan to change the dressing on your abdomen twice daily. You can moisten a gauze with saline and wring out the gauze to remove excess saline so it is dampened but not saturated. Place this gauze on the open area of the incision and place a dry gauze on top with tape around the edges.   Driving Restrictions   Complete by: As directed    No driving for 2 weeks from surgery.  Do not take narcotics and drive. You need to make sure your reaction time has returned and you can brake safely.   Increase activity slowly   Complete by: As directed    Lifting restrictions   Complete by: As directed    No lifting greater than 10 lbs.   Sexual Activity Restrictions   Complete by: As directed    No sexual activity, nothing in the vagina, for 4 weeks.      Allergies as of 01/28/2021   No Known Allergies      Medication List     STOP taking these medications    ampicillin 500 MG capsule Commonly known as: PRINCIPEN       TAKE these medications    acetaminophen 500 MG tablet Commonly known as: TYLENOL Take 500-1,000 mg by mouth every 6 (six) hours as needed for moderate pain or mild pain.   ibuprofen 800 MG tablet Commonly known as: ADVIL Take 1 tablet (800 mg total) by mouth every 8 (eight) hours as needed for moderate pain. For AFTER surgery only   levothyroxine 125 MCG tablet Commonly known as: SYNTHROID Take 125 mcg by mouth daily.   LORazepam 0.5 MG tablet Commonly known as: ATIVAN Take 1 tablet (0.5 mg total) by mouth every 6 (six) hours as needed for anxiety. Do not take and drive, do not take with pain medication   senna-docusate 8.6-50 MG tablet Commonly known as: Senokot-S Take 2 tablets by mouth at bedtime. For AFTER surgery, do not take if having diarrhea   traMADol 50 MG  tablet Commonly known as: ULTRAM Take 1 tablet (50 mg total) by mouth every 6 (six) hours as needed for severe pain. For AFTER surgery, do not take and drive               Discharge Care Instructions  (From admission, onward)           Start     Ordered   01/28/21 0000  Discharge wound care:       Comments: Plan to change the dressing on your abdomen twice daily. You can moisten a gauze with saline and wring out the gauze to remove excess saline so it is dampened but not saturated. Place this gauze on the open area of the incision and place a dry gauze on top with tape around the edges.   01/28/21 1041            Follow-up Information     [name] D, NP Follow up on 02/02/2021.   Specialty: Gynecologic Oncology Why: at 2pm at the [facility] for post-op follow up. Contact information: [address] [place] [address] [phone]  Lafonda Mosses, MD Follow up on 02/09/2021.   Specialty: Gynecologic Oncology Why: at 4pm at the [facility] information: [address] [place] [address] [phone]                 Greater than thirty minutes were spend for face to face discharge instructions and discharge orders/summary in EPIC.   Signed: Dorothyann Gibbs 01/28/2021, 1:26 PM

## 2021-01-28 NOTE — Plan of Care (Signed)
Instructions were reviewed with patient. All questions were answered. Patient was transported to main entrance by wheelchair. ° °

## 2021-01-29 ENCOUNTER — Telehealth: Payer: Self-pay

## 2021-01-29 NOTE — Telephone Encounter (Signed)
Spoke with Brittney Herrera this morning. She reports feeling better than expected. She states she is eating, drinking and urinating well. She is having regular BM's. She denies fever or chills. She changed her dressing last night and is planning on changing it again this afternoon. She reports some swelling in her abdomen but states this is improved from when she was in the hospital. She rates her pain as 5/10 when ambulating but is comfortable when resting. She has been alternating tylenol and ibuprofen and using tramadol as needed.   Instructed to call office with any fever, chills, purulent drainage, uncontrolled pain or any other questions or concerns. Patient verbalizes understanding.   Pt aware of post op appointments as well as the office number [phone] and after hours number [phone] to call if she has any questions or concerns

## 2021-01-30 ENCOUNTER — Telehealth: Payer: Self-pay

## 2021-01-30 ENCOUNTER — Ambulatory Visit: Payer: PRIVATE HEALTH INSURANCE | Admitting: Gynecologic Oncology

## 2021-01-30 NOTE — Telephone Encounter (Signed)
Received call from Ms. Bartram this morning. Patient states today was the first day she really had a chance to look at her abdominal wound and is surprised how large it is. She wants to ensure that her wound has not opened up anymore. Per patient incision is open to almost her belly button. She is inquiring if she should start using an abdominal binder or use something to close the wound edges. Patient denies fever, chills, drainage or odor from incision.  [name], NP notified. Reassured patient that incision was open to her belly button while she was in the hospital. Instructed to continue with her dressing changes as directed. She should not use an abdominal binder or anything to bring the incision together. We will assess the wound on Monday when she comes in for her appointment. Instructed patient she can send images via mychart if she has a questions or concerns about the wound. Patient verbalized understanding. Patient has after hours number if she has questions over the weekend. Instructed to call with any needs.

## 2021-02-02 ENCOUNTER — Other Ambulatory Visit: Payer: Self-pay

## 2021-02-02 ENCOUNTER — Inpatient Hospital Stay: Payer: BC Managed Care – PPO | Attending: Gynecologic Oncology | Admitting: Gynecologic Oncology

## 2021-02-02 ENCOUNTER — Encounter: Payer: Self-pay | Admitting: Gynecologic Oncology

## 2021-02-02 VITALS — BP 116/73 | HR 71 | Temp 98.4°F | Resp 16 | Ht 62.0 in | Wt 191.0 lb

## 2021-02-02 DIAGNOSIS — T8131XA Disruption of external operation (surgical) wound, not elsewhere classified, initial encounter: Secondary | ICD-10-CM | POA: Insufficient documentation

## 2021-02-02 DIAGNOSIS — N322 Vesical fistula, not elsewhere classified: Secondary | ICD-10-CM

## 2021-02-02 DIAGNOSIS — Z79899 Other long term (current) drug therapy: Secondary | ICD-10-CM | POA: Diagnosis not present

## 2021-02-02 DIAGNOSIS — N9489 Other specified conditions associated with female genital organs and menstrual cycle: Secondary | ICD-10-CM

## 2021-02-02 DIAGNOSIS — M1990 Unspecified osteoarthritis, unspecified site: Secondary | ICD-10-CM | POA: Diagnosis not present

## 2021-02-02 DIAGNOSIS — T8131XD Disruption of external operation (surgical) wound, not elsewhere classified, subsequent encounter: Secondary | ICD-10-CM

## 2021-02-02 DIAGNOSIS — M0609 Rheumatoid arthritis without rheumatoid factor, multiple sites: Secondary | ICD-10-CM | POA: Diagnosis not present

## 2021-02-02 DIAGNOSIS — M2550 Pain in unspecified joint: Secondary | ICD-10-CM | POA: Diagnosis not present

## 2021-02-02 NOTE — Progress Notes (Signed)
GYN Oncology Post-op Follow Up  HPI: Brittney Herrera is a 45 year old female initially seen in the office on 12/29/2020 for a complex adnexal mass and abnormal uterine bleeding. On 01/20/2021, she was taken to the operating room for hysteroscopy, endometrial sampling using the Myosure under hysteroscopic guidance, diagnostic laparoscopy, robotic right salpingectomy, enterolysis, aborted hysterectomy and left salpingo-oophorectomy with operative findings including a bladder fistula with intra-op fistulogram showing likely connection to cervix/uterus, suspect adnexa as well, along with dense adhesions with the left adnexa to the sigmoid colon and mesentery. Due to the concern for possible bowel injury and the unanticipated bowel involvement without a bowel prep, the decision was made to stop the procedure and return after adequate bowel prepping. On 01/22/2021, the patient returned to the operating room after having a bowel prep the day before for the following: Diagnostic laparoscopy, exploratory laparotomy with thorough inspection of small and large bowel (with Dr. Dema Severin), backfill of bladder, cytoscopy with retrograde pyelogram and attempted fistulogram (with Dr. Milford Cage). On 01/23/21, she had a CT scan AP followed by an MRI of the pelvis on 01/24/2021 to further evaluate the pelvis and bladder fistula.  The postoperative course included prophylactic IV cefoxitin and flagyl for 24 hours post-op, foley catheter with no difficulties voiding after removal, superficial abdominal incisional separation with twice daily dressing changes, post-operative ileus requiring NG tube placement on 01/26/2021 with removal the following day in the evening.  She was discharged to home on postoperative day 6 (from 2nd operation) tolerating a regular diet, ambulating, voiding, passing flatus, having bowel movements, pain controlled with oral medications. She was instructed on abdominal incision dressing changes and supplies given.    Interval HPI: She presents today for post-operative follow up and incision check. She states she is doing well at home. Tolerating diet with no nausea or emesis. Having two bowel movements a day but have been more firm and having to strain intermittently. No issues voiced with her bladder. States she had an episode of dizziness while out at the pharmacy the day after discharge but has not experienced this since. Abdomen still feels swollen. She is passing flatus but still reports feeling gassy. Incisional pain is controlled best with ibuprofen. She has been changing the midline dressing twice daily.     Exam: Alert, oriented x 3, in no acute distress. Lungs clear. Heart regular in rate and rhythm. Abdomen rounded, soft, active bowel sounds. Lap sites to the abdomen intact with dermabond present. Midline abdominal dressing removed. The open lower aspect of the incision is red, moist. No evidence of surrounding erythema or drainage. 3 dissolvable staples noted in the wound bed but unable to remove due to attachment to underlying tissue. Bilateral non pitting lower extrem edema, mild. Dressing changed and supplies given.  Assessment/Plan: 45 year old female doing well post-operatively. She is advised to follow up as schedule with Brittney Herrera on 02/09/2021 or sooner if needed. She is advised to continue with dressing changes and supplies given. Reportable signs and symptoms reviewed.

## 2021-02-02 NOTE — Patient Instructions (Addendum)
Continue with the incisional dressing changes. Plan to follow up as schedule with Dr. Berline Lopes on 02/09/2021 or sooner if needed. Please call the office for any new symptoms (such as fever, worsening pain, issues with the incision) questions, or concerns.  You can use water or saline to moistened the dry gauze when you are ready to change the dressing.

## 2021-02-09 ENCOUNTER — Other Ambulatory Visit: Payer: Self-pay

## 2021-02-09 ENCOUNTER — Inpatient Hospital Stay (HOSPITAL_BASED_OUTPATIENT_CLINIC_OR_DEPARTMENT_OTHER): Payer: BC Managed Care – PPO | Admitting: Gynecologic Oncology

## 2021-02-09 VITALS — BP 120/84 | HR 91 | Temp 98.0°F | Resp 16 | Ht 62.0 in | Wt 187.9 lb

## 2021-02-09 DIAGNOSIS — N9489 Other specified conditions associated with female genital organs and menstrual cycle: Secondary | ICD-10-CM

## 2021-02-09 DIAGNOSIS — N322 Vesical fistula, not elsewhere classified: Secondary | ICD-10-CM

## 2021-02-09 DIAGNOSIS — T8131XD Disruption of external operation (surgical) wound, not elsewhere classified, subsequent encounter: Secondary | ICD-10-CM

## 2021-02-09 DIAGNOSIS — Z90722 Acquired absence of ovaries, bilateral: Secondary | ICD-10-CM

## 2021-02-09 NOTE — Progress Notes (Signed)
Gynecologic Oncology Return Clinic Visit  02/09/2021  Reason for Visit: Follow-up after surgery  Interval History: Overall doing well.  Taking Tylenol and ibuprofen to help control pain, denies any significant abdominal or pelvic pain.  Bowel function has improved.  She has some days with normal bowel function, others where her bowel seems hyperactive, and others where she feels constipated.  Stopped taking anything because this caused stools to be loose.  Denies any bladder symptoms.  Occasionally notes cloudy urine, denies any odor to her urine.  Appetite has improved, denies nausea or emesis.  Changing her dressing daily, notes that it is overall healing well.  Denies any malodorous drainage or bleeding.  Past Medical/Surgical History: Past Medical History:  Diagnosis Date   Arthritis    Bicornuate uterus    Hemorrhoids 11/18/2020   History of abnormal cervical Pap smear    Hypertriglyceridemia 11/18/2020   Hypothyroidism    Rheumatoid aortitis    Thyroid disease    Umbilical hernia 41/66/0630    Past Surgical History:  Procedure Laterality Date   ADENOIDECTOMY  02/22/1982   CESAREAN SECTION  04/07/2005   CESAREAN SECTION  09/24/2011   cyst N/A 12/15/2020   2 scalp cysts removed, benign   CYSTOSCOPY W/ RETROGRADES  01/22/2021   Procedure: CYSTOSCOPY WITH RETROGRADE PYELOGRAM;FISTULOGRAM;  Surgeon: Remi Haggard, MD;  Location: [facility];  Service: Urology;;   [place] N/A 01/20/2021   Procedure: [place];  Surgeon: Lafonda Mosses, MD;  Location: [facility];  Service: Gynecology;  Laterality: N/A;   DILATION AND CURETTAGE, DIAGNOSTIC / THERAPEUTIC  12/23/2012   FLEXIBLE SIGMOIDOSCOPY N/A 01/22/2021   Procedure: DIAGNOSTIC FLEXIBLE SIGMOIDOSCOPY;  Surgeon: Ileana Roup, MD;  [facility];  Service: General;  Laterality: N/A;   LAPAROTOMY N/A 01/22/2021   Procedure: EXPLORATORY LAPAROTOMY;   Surgeon: Lafonda Mosses, MD;  [facility];  Service: Gynecology;  Laterality: N/A;   LAPAROTOMY N/A 01/22/2021   Procedure: EXPLORATORY LAPAROTOMY;  Surgeon: Ileana Roup, MD;  [facility];  Service: General;  Laterality: N/A;   MYRINGOTOMY WITH TUBE PLACEMENT     TUBAL LIGATION Bilateral 12/23/2012   WISDOM TOOTH EXTRACTION  02/22/1993   XI ROBOTIC ASSISTED SALPINGECTOMY Right 01/20/2021   Procedure: XI ROBOTIC ASSISTED RIGHT  SALPINGECTOMY, LEFT OVARIAN BIOPSY;  Surgeon: Lafonda Mosses, MD;  [facility];  Service: Gynecology;  Laterality: Right;    Family History  Problem Relation Age of Onset   Diabetes Father    Leukemia Father    Heart disease Maternal Grandmother    Heart disease Maternal Grandfather    Heart disease Paternal Grandmother    Skin cancer Other    Colon cancer Neg Hx    Breast cancer Neg Hx    Ovarian cancer Neg Hx    Endometrial cancer Neg Hx    Prostate cancer Neg Hx    Pancreatic cancer Neg Hx     Social History   Socioeconomic History   Marital status: Married    Spouse name: Not on file   Number of children: Not on file   Years of education: Not on file   Highest education level: Not on file  Occupational History   Not on file  Tobacco Use   Smoking status: Former    Packs/day: 0.25    Years: 20.00    Pack years: 5.00    Types: Cigarettes    Quit date: 2013  Years since quitting: 9.9   Smokeless tobacco: Never  Vaping Use   Vaping Use: Never used  Substance and Sexual Activity   Alcohol use: Yes    Comment: Occasionally   Drug use: Never   Sexual activity: Yes    Birth control/protection: Surgical    Comment: TL  Other Topics Concern   Not on file  Social History Narrative   Not on file   Social Determinants of Health   Financial Resource Strain: Not on file  Food Insecurity: Not on file  Transportation Needs: Not on file  Physical Activity: Not on file  Stress: Not on file  Social  Connections: Not on file    Current Medications:  Current Outpatient Medications:    acetaminophen (TYLENOL) 500 MG tablet, Take 500-1,000 mg by mouth every 6 (six) hours as needed for moderate pain or mild pain., Disp: , Rfl:    ibuprofen (ADVIL) 800 MG tablet, Take 1 tablet (800 mg total) by mouth every 8 (eight) hours as needed for moderate pain. For AFTER surgery only, Disp: 30 tablet, Rfl: 0   levothyroxine (SYNTHROID) 125 MCG tablet, Take 125 mcg by mouth daily., Disp: , Rfl:    LORazepam (ATIVAN) 0.5 MG tablet, Take 1 tablet (0.5 mg total) by mouth every 6 (six) hours as needed for anxiety. Do not take and drive, do not take with pain medication, Disp: 5 tablet, Rfl: 0   senna-docusate (SENOKOT-S) 8.6-50 MG tablet, Take 2 tablets by mouth at bedtime. For AFTER surgery, do not take if having diarrhea, Disp: 30 tablet, Rfl: 0   traMADol (ULTRAM) 50 MG tablet, Take 1 tablet (50 mg total) by mouth every 6 (six) hours as needed for severe pain. For AFTER surgery, do not take and drive, Disp: 15 tablet, Rfl: 0  Review of Systems: Denies appetite changes, fevers, chills, fatigue, unexplained weight changes. Denies hearing loss, neck lumps or masses, mouth sores, ringing in ears or voice changes. Denies cough or wheezing.  Denies shortness of breath. Denies chest pain or palpitations. Denies leg swelling. Denies abdominal distention, pain, blood in stools, constipation, diarrhea, nausea, vomiting, or early satiety. Denies pain with intercourse, dysuria, frequency, hematuria or incontinence. Denies hot flashes, pelvic pain, vaginal bleeding or vaginal discharge.   Denies joint pain, back pain or muscle pain/cramps. Denies itching, rash, or wounds. Denies dizziness, headaches, numbness or seizures. Denies swollen lymph nodes or glands, denies easy bruising or bleeding. Denies anxiety, depression, confusion, or decreased concentration.  Physical Exam: BP 120/84 (BP Location: Right Arm,  Patient Position: Sitting)    Pulse 91    Temp 98 F (36.7 C) (Tympanic)    Resp 16    Ht 5\' 2"  (1.575 m)    Wt 187 lb 14.4 oz (85.2 kg)    SpO2 99%    BMI 34.37 kg/m  General: Alert, oriented, no acute distress. HEENT: Normocephalic, atraumatic, sclera anicteric. Chest: Unlabored breathing on room air. Abdomen: Obese, soft, nontender, much less distended than during her hospitalization.  Normoactive bowel sounds.  No masses or hepatosplenomegaly appreciated.  Incision intact at superior and inferior aspect.  The majority of the incision has superficial dehiscence with wet-to-dry dressing in place, healed significantly since her hospitalization, no erythema or exudate. Extremities: Grossly normal range of motion.  Warm, well perfused.  No edema bilaterally.  Laboratory & Radiologic Studies: None new  Assessment & Plan: Brittney Herrera is a 45 y.o. 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    Is overall improving since hospital discharge.  Discussed wound care and continued dressing.  I will see her in another month for an incision check.  Reviewed her GI dysfunction, which I suspect is still related to some edema and healing after surgery.  Recommended that she try fiber to help with her intermittent constipation and diarrhea.  We reviewed again findings from the second surgery with evidence of bladder fistula.  My plan is to repeat imaging in 3-4 months.  We had ultimately discussed more definitive surgery at some point over the summer or early next fall (at least 6 months from her last surgery).  This will likely require bowel resection and possible excision of part of her bladder to close the fistula.  We will discuss closer  to the date whether she is better served here with multidisciplinary care versus at a tertiary center like [facility].  28 minutes of total time was spent for this patient encounter, including preparation, face-to-face counseling with the patient and coordination of care, and documentation of the encounter.  Jeral Pinch, MD  Division of Gynecologic Oncology  Department of Obstetrics and Gynecology  [facility]

## 2021-02-09 NOTE — Patient Instructions (Signed)
It was good to see you today.  I will see you in 4 weeks for another checkup to take a peek at your incision.  If you need anything before then, please call to let me know.

## 2021-02-17 DIAGNOSIS — Z13220 Encounter for screening for lipoid disorders: Secondary | ICD-10-CM | POA: Diagnosis not present

## 2021-02-17 DIAGNOSIS — D4989 Neoplasm of unspecified behavior of other specified sites: Secondary | ICD-10-CM | POA: Diagnosis not present

## 2021-02-17 DIAGNOSIS — E039 Hypothyroidism, unspecified: Secondary | ICD-10-CM | POA: Diagnosis not present

## 2021-02-17 DIAGNOSIS — M0609 Rheumatoid arthritis without rheumatoid factor, multiple sites: Secondary | ICD-10-CM | POA: Diagnosis not present

## 2021-02-27 ENCOUNTER — Other Ambulatory Visit: Payer: Self-pay | Admitting: Gynecologic Oncology

## 2021-02-27 ENCOUNTER — Telehealth: Payer: Self-pay | Admitting: *Deleted

## 2021-02-27 DIAGNOSIS — R399 Unspecified symptoms and signs involving the genitourinary system: Secondary | ICD-10-CM

## 2021-02-27 NOTE — Telephone Encounter (Signed)
Patient called and stated "Dr Berline Lopes told me to let her know when or if I started having bladder issues. I started having them 2 days ago; today is the third day. I have increase in frequency, a feeling of not emptying my bladder, odor, burning and some pain. The urine is cloudy in color."

## 2021-02-27 NOTE — Telephone Encounter (Signed)
Following up with Brittney Herrera. Advised patient that Dr. Berline Lopes would like her to come in and give a urine sample. The cancer center lab last appointment is at 4:15 pm today. Patient is unable to come in to provide a sample today but is able to go to the [place] long lab tomorrow. (Spoke with Ovid Curd at [facility] lab and he verified he could see the order and patient can come to provide sample without issue).  Patient inquiring if she can take Azo for relief. Advised not to take Azo until after she comes in for her urine sample as it can affect the sample. Patient verbalized understanding. Patient has clinic number as well as after hours number and instructed to call with any needs.

## 2021-02-27 NOTE — Addendum Note (Signed)
Addended by: [name] D on: 02/27/2021 05:00 PM   Modules accepted: Orders

## 2021-03-02 NOTE — Telephone Encounter (Signed)
Spoke with the patient and scheduled a lab appt tomorrow for a urine check. Patient stated that things are the same no better, no worse. I have been drinking lots of water and cranberry juice."

## 2021-03-03 ENCOUNTER — Inpatient Hospital Stay: Payer: BC Managed Care – PPO | Attending: Gynecologic Oncology

## 2021-03-03 ENCOUNTER — Other Ambulatory Visit: Payer: Self-pay

## 2021-03-03 DIAGNOSIS — R399 Unspecified symptoms and signs involving the genitourinary system: Secondary | ICD-10-CM

## 2021-03-03 DIAGNOSIS — D398 Neoplasm of uncertain behavior of other specified female genital organs: Secondary | ICD-10-CM | POA: Insufficient documentation

## 2021-03-03 DIAGNOSIS — N939 Abnormal uterine and vaginal bleeding, unspecified: Secondary | ICD-10-CM | POA: Insufficient documentation

## 2021-03-04 ENCOUNTER — Telehealth: Payer: Self-pay

## 2021-03-04 LAB — URINE CULTURE: Culture: 2000 — AB

## 2021-03-04 NOTE — Telephone Encounter (Signed)
Following up with Brittney Herrera regarding her urinary symptoms and urine culture results. Per [name], NP very little growing in urine. Patient reports she feels like she is at the beginning stages of a UTI. She states her symptoms feel mild but wanted our office to be notified. She describes her urine as cloudy with "chunks" floating in it. She denies vaginal discharge. She has increased frequency, mild pain, odor, burning and does not feel like she is completely emptying her bladder. She states her symptoms have not gotten worse. Advised patient that Dr. Berline Lopes is in the OR today but she will be notified of her symptoms and someone from the office will call her back. Patient verbalized understanding.

## 2021-03-05 NOTE — Telephone Encounter (Signed)
Spoke with [place] this afternoon. Per [name], NP advised patient to continue to monitor her symptoms and notify our office if symptoms become worse. Patient can try taking over the counter AZO. Patient verbalized understanding. Confirmed follow up appointment with Dr. Berline Lopes on 03/13/21 at 1:45 pm. Instructed to call with any questions or concerns.

## 2021-03-06 ENCOUNTER — Encounter: Payer: Self-pay | Admitting: Gynecologic Oncology

## 2021-03-09 ENCOUNTER — Ambulatory Visit: Payer: BC Managed Care – PPO | Admitting: Gynecologic Oncology

## 2021-03-13 ENCOUNTER — Other Ambulatory Visit: Payer: Self-pay

## 2021-03-13 ENCOUNTER — Inpatient Hospital Stay (HOSPITAL_BASED_OUTPATIENT_CLINIC_OR_DEPARTMENT_OTHER): Payer: BC Managed Care – PPO | Admitting: Gynecologic Oncology

## 2021-03-13 ENCOUNTER — Encounter: Payer: Self-pay | Admitting: Gynecologic Oncology

## 2021-03-13 VITALS — BP 145/96 | HR 109 | Temp 98.8°F | Resp 18 | Ht 62.0 in | Wt 179.0 lb

## 2021-03-13 DIAGNOSIS — Z90721 Acquired absence of ovaries, unilateral: Secondary | ICD-10-CM

## 2021-03-13 DIAGNOSIS — Z90710 Acquired absence of both cervix and uterus: Secondary | ICD-10-CM

## 2021-03-13 DIAGNOSIS — N322 Vesical fistula, not elsewhere classified: Secondary | ICD-10-CM

## 2021-03-13 DIAGNOSIS — T8131XD Disruption of external operation (surgical) wound, not elsewhere classified, subsequent encounter: Secondary | ICD-10-CM

## 2021-03-13 DIAGNOSIS — N9489 Other specified conditions associated with female genital organs and menstrual cycle: Secondary | ICD-10-CM

## 2021-03-13 NOTE — Progress Notes (Signed)
Gynecologic Oncology Return Clinic Visit  03/13/2021  Reason for Visit: Wound check, follow-up other postoperative issues  Treatment History: Presented initially with complex adnexal mass, abnormal uterine bleeding.  Patient reports presenting recently for gynecologic evaluation secondary to a 2-year history of abnormal menses.  She notes regular menses until last year when her menstrual cycles became longer and heavier.  Last November, she had a long, heavy menses and then has only had a couple of menstrual cycles this year.  She will go months without a cycle.  Her most recent 2 cycles were about a month apart.  Her bleeding is much lighter than it used to be, but requires a pad a little heavier than a panty liner.  She describes it as being thicker with some passage of clots.  She denies any intermenstrual bleeding.  She has some pain and cramping with her menses at baseline, has not been as often since her menstrual volume decreased.   Given menstrual changes, the patient underwent pelvic ultrasound in October that showed a 7.4 cm complex adnexal mass that had increased from a greatest diameter of 4 cm in November of last year.  CA-125 was normal, HE4 and Overa both elevated.  On 01/20/2021, the patient was taken to the operating room for a complex adnexal mass and abnormal uterine bleeding. 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 Due to the concern for possible bowel injury and the unanticipated bowel involvement without a bowel prep, the decision was made to stop the procedure and  return after adequate bowel prepping. On 01/22/2021, the patient returned to the operating room after having a bowel prep the day before for the following: Diagnostic laparoscopy, exploratory laparotomy with thorough inspection of small and large bowel (with Dr. Dema Severin), backfill of bladder, cytoscopy with retrograde pyelogram and attempted fistulogram (with Dr. Milford Cage). On 01/23/21, she had a CT scan AP followed by an MRI of the pelvis on 01/24/2021 to further evaluate the pelvis and bladder fistula.   The postoperative course included prophylactic IV cefoxitin and flagyl for 24 hours post-op, foley catheter with no difficulties voiding after removal, superficial abdominal incisional separation with twice daily dressing changes, post-operative ileus requiring NG tube placement on 01/26/2021 with removal the following day in the evening.  She was discharged to home on postoperative day 6 (from 2nd operation).   Interval History: Since her last visit with me, she notes overall doing very well.  Her incision has healed completely with the exception of 2 small scabbed areas.  She denies any abdominal or pelvic pain.  She denies any urinary symptoms although notes that her urine looks quite cloudy with particulate.  She endorses normal bowel function.  Her appetite is overall decreased which she thinks is related to anxiety.  Her father's cancer has stopped responding to treatment.  She is drinking protein shakes to make sure she is getting sufficient protein and calories.  Past Medical/Surgical History: Past Medical History:  Diagnosis Date   Arthritis    Bicornuate uterus    Hemorrhoids 11/18/2020   History of abnormal cervical Pap smear    Hypertriglyceridemia 11/18/2020   Hypothyroidism    Rheumatoid aortitis    Thyroid disease  Umbilical hernia 83/38/2505    Past Surgical History:  Procedure Laterality Date   ADENOIDECTOMY  02/22/1982   CESAREAN SECTION  04/07/2005   CESAREAN SECTION   09/24/2011   cyst N/A 12/15/2020   2 scalp cysts removed, benign   CYSTOSCOPY W/ RETROGRADES  01/22/2021   Procedure: CYSTOSCOPY WITH RETROGRADE PYELOGRAM;FISTULOGRAM;  Surgeon: Remi Haggard, MD;  [facility]: [facility];  Service: Urology;;   [place] N/A 01/20/2021   Procedure: [place];  Surgeon: Lafonda Mosses, MD;  [facility];  Service: Gynecology;  Laterality: N/A;   DILATION AND CURETTAGE, DIAGNOSTIC / THERAPEUTIC  12/23/2012   FLEXIBLE SIGMOIDOSCOPY N/A 01/22/2021   Procedure: DIAGNOSTIC FLEXIBLE SIGMOIDOSCOPY;  Surgeon: Ileana Roup, MD;  [facility];  Service: General;  Laterality: N/A;   LAPAROTOMY N/A 01/22/2021   Procedure: EXPLORATORY LAPAROTOMY;  Surgeon: Lafonda Mosses, MD;  [facility];  Service: Gynecology;  Laterality: N/A;   LAPAROTOMY N/A 01/22/2021   Procedure: EXPLORATORY LAPAROTOMY;  Surgeon: Ileana Roup, MD;  [facility];  Service: General;  Laterality: N/A;   MYRINGOTOMY WITH TUBE PLACEMENT     TUBAL LIGATION Bilateral 12/23/2012   WISDOM TOOTH EXTRACTION  02/22/1993   XI ROBOTIC ASSISTED SALPINGECTOMY Right 01/20/2021   Procedure: XI ROBOTIC ASSISTED RIGHT  SALPINGECTOMY, LEFT OVARIAN BIOPSY;  Surgeon: Lafonda Mosses, MD;  [facility];  Service: Gynecology;  Laterality: Right;    Family History  Problem Relation Age of Onset   Diabetes Father    Leukemia Father    Heart disease Maternal Grandmother    Heart disease Maternal Grandfather    Heart disease Paternal Grandmother    Skin cancer Other    Colon cancer Neg Hx    Breast cancer Neg Hx    Ovarian cancer Neg Hx    Endometrial cancer Neg Hx    Prostate cancer Neg Hx    Pancreatic cancer Neg Hx     Social History   Socioeconomic History   Marital status: Married    Spouse name: Not on file   Number of children: Not on file   Years of education: Not on file   Highest  education level: Not on file  Occupational History   Not on file  Tobacco Use   Smoking status: Former    Packs/day: 0.25    Years: 20.00    Pack years: 5.00    Types: Cigarettes    Quit date: 2013    Years since quitting: 10.0   Smokeless tobacco: Never  Vaping Use   Vaping Use: Never used  Substance and Sexual Activity   Alcohol use: Yes    Comment: Occasionally   Drug use: Never   Sexual activity: Yes    Birth control/protection: Surgical    Comment: TL  Other Topics Concern   Not on file  Social History Narrative   Not on file   Social Determinants of Health   Financial Resource Strain: Not on file  Food Insecurity: Not on file  Transportation Needs: Not on file  Physical Activity: Not on file  Stress: Not on file  Social Connections: Not on file    Current Medications:  Current Outpatient Medications:    acetaminophen (TYLENOL) 500 MG tablet, Take 500-1,000 mg by mouth every 6 (six) hours as needed for moderate pain or mild pain., Disp: , Rfl:    folic acid (FOLVITE) 1 MG tablet, Take 1 mg by mouth daily.,  Disp: , Rfl:    ibuprofen (ADVIL) 800 MG tablet, Take 1 tablet (800 mg total) by mouth every 8 (eight) hours as needed for moderate pain. For AFTER surgery only, Disp: 30 tablet, Rfl: 0   levothyroxine (SYNTHROID) 125 MCG tablet, Take 125 mcg by mouth daily., Disp: , Rfl:    LORazepam (ATIVAN) 0.5 MG tablet, Take 1 tablet (0.5 mg total) by mouth every 6 (six) hours as needed for anxiety. Do not take and drive, do not take with pain medication (Patient not taking: Reported on 03/06/2021), Disp: 5 tablet, Rfl: 0   senna-docusate (SENOKOT-S) 8.6-50 MG tablet, Take 2 tablets by mouth at bedtime. For AFTER surgery, do not take if having diarrhea (Patient not taking: Reported on 03/06/2021), Disp: 30 tablet, Rfl: 0   traMADol (ULTRAM) 50 MG tablet, Take 1 tablet (50 mg total) by mouth every 6 (six) hours as needed for severe pain. For AFTER surgery, do not take and drive  (Patient not taking: Reported on 03/06/2021), Disp: 15 tablet, Rfl: 0  Review of Systems: Denies fevers, chills, fatigue, unexplained weight changes. Denies hearing loss, neck lumps or masses, mouth sores, ringing in ears or voice changes. Denies cough or wheezing.  Denies shortness of breath. Denies chest pain or palpitations. Denies leg swelling. Denies abdominal distention, pain, blood in stools, constipation, diarrhea, nausea, vomiting, or early satiety. Denies pain with intercourse, dysuria, frequency, hematuria or incontinence. Denies hot flashes, pelvic pain, vaginal bleeding or vaginal discharge.   Denies joint pain, back pain or muscle pain/cramps. Denies itching, rash, or wounds. Denies dizziness, headaches, numbness or seizures. Denies swollen lymph nodes or glands, denies easy bruising or bleeding. Denies anxiety, depression, confusion, or decreased concentration.  Physical Exam: BP (!) 145/96 (BP Location: Right Arm, Patient Position: Sitting)    Pulse (!) 109    Temp 98.8 F (37.1 C) (Tympanic)    Resp 18    Ht 5\' 2"  (1.575 m)    Wt 179 lb (81.2 kg)    SpO2 97%    BMI 32.74 kg/m  General: Alert, oriented, no acute distress. HEENT: Normocephalic, atraumatic, sclera anicteric. Chest: Unlabored breathing on room air. Abdomen: Obese, soft, nontender.  Normoactive bowel sounds.  No masses or hepatosplenomegaly appreciated.  Scar has completely healed now, there are 2 small scabs in the midline just inferior to the umbilicus.  No erythema or induration. Extremities: Grossly normal range of motion.  Warm, well perfused.  No edema bilaterally.  Laboratory & Radiologic Studies: None new  Assessment & Plan: Brittney Herrera is a 46 y.o. woman with complex adnexal mass with multiple surgeries recently after aborted attempt at mass removal given adherence to sigmoid colon without a bowel prep and exploratory laparotomy subsequently with ultimate diagnosis of a bladder fistula,  suspected to involve the cervix/uterus and possibly the left adnexa.    Patient's wound has healed completely from surgery.    Reviewed that patient has no restrictions now given length of time since surgery.  She would like to go skiing this winter, which I encouraged her to do.  From a symptom standpoint, she is not having any urinary symptoms.  She had called recently with some urine symptoms and urine culture showed very few growth of bacteria.  This was not treated and symptoms resolved with several days of Azo.  I discussed with her that unless she were to have significant symptoms concerning for infection and a culture showing infection, that my preference would be to avoid continued antibiotic  administration.  I will plan to see her in 3 months and we will discuss repeating imaging at that time.  Any future surgery will be a major undertaking with significant morbidity associated.  If the patient remains asymptomatic and there is no increased size or worsening/concerning appearance of the adnexal mass, I do not think that surgery is necessary.  If this were the case, we would continue with close imaging follow-up.  28 minutes of total time was spent for this patient encounter, including preparation, face-to-face counseling with the patient and coordination of care, and documentation of the encounter.  Jeral Pinch, MD  Division of Gynecologic Oncology  Department of Obstetrics and Gynecology  [facility]

## 2021-03-13 NOTE — Patient Instructions (Signed)
It was great to see you today!  I am happy to hear that you have healed so well from surgery.  I will plan to see you back in 3 months.  At that point we will discuss repeating some imaging to look at the fistula.  If you need anything before then, please call to let me know.

## 2021-05-21 ENCOUNTER — Telehealth: Payer: Self-pay | Admitting: *Deleted

## 2021-05-21 NOTE — Telephone Encounter (Signed)
Spoke with pt this morning. Pt was wondering if we were going to schedule her for imaging. After reviewing Dr.Tucker's note informed pt that Dr.Tucker wanted to see her for a follow up in 3 months and there they would discuss imaging needs. Reminded pt of her appointment with Dr. Berline Lopes at 06/12/21. Pt verbalized understanding and did not voice any other concerns at this time.  ?

## 2021-05-26 DIAGNOSIS — Z13220 Encounter for screening for lipoid disorders: Secondary | ICD-10-CM | POA: Diagnosis not present

## 2021-05-26 DIAGNOSIS — M0609 Rheumatoid arthritis without rheumatoid factor, multiple sites: Secondary | ICD-10-CM | POA: Diagnosis not present

## 2021-05-26 DIAGNOSIS — E669 Obesity, unspecified: Secondary | ICD-10-CM | POA: Diagnosis not present

## 2021-05-26 DIAGNOSIS — R7301 Impaired fasting glucose: Secondary | ICD-10-CM | POA: Diagnosis not present

## 2021-05-26 DIAGNOSIS — E039 Hypothyroidism, unspecified: Secondary | ICD-10-CM | POA: Diagnosis not present

## 2021-06-09 ENCOUNTER — Encounter: Payer: Self-pay | Admitting: Gynecologic Oncology

## 2021-06-12 ENCOUNTER — Inpatient Hospital Stay: Payer: BC Managed Care – PPO | Attending: Gynecologic Oncology | Admitting: Gynecologic Oncology

## 2021-06-12 ENCOUNTER — Other Ambulatory Visit: Payer: Self-pay

## 2021-06-12 VITALS — BP 107/75 | HR 80 | Temp 97.1°F | Resp 18 | Ht 64.96 in | Wt 174.6 lb

## 2021-06-12 DIAGNOSIS — N322 Vesical fistula, not elsewhere classified: Secondary | ICD-10-CM

## 2021-06-12 DIAGNOSIS — N9489 Other specified conditions associated with female genital organs and menstrual cycle: Secondary | ICD-10-CM

## 2021-06-12 DIAGNOSIS — R32 Unspecified urinary incontinence: Secondary | ICD-10-CM

## 2021-06-12 DIAGNOSIS — N939 Abnormal uterine and vaginal bleeding, unspecified: Secondary | ICD-10-CM | POA: Insufficient documentation

## 2021-06-12 DIAGNOSIS — N360 Urethral fistula: Secondary | ICD-10-CM | POA: Diagnosis not present

## 2021-06-12 DIAGNOSIS — Z7189 Other specified counseling: Secondary | ICD-10-CM

## 2021-06-12 DIAGNOSIS — R1900 Intra-abdominal and pelvic swelling, mass and lump, unspecified site: Secondary | ICD-10-CM | POA: Insufficient documentation

## 2021-06-12 NOTE — Progress Notes (Signed)
Gynecologic Oncology Return Clinic Visit ? ?06/12/2021 ? ?Reason for Visit: Follow-up in the setting of complex pelvic mass, fistula ? ?Treatment History: ?Presented initially with complex adnexal mass, abnormal uterine bleeding. ?  ?Patient reports presenting recently for gynecologic evaluation secondary to a 2-year history of abnormal menses.  She notes regular menses until last year when her menstrual cycles became longer and heavier.  Last November, she had a long, heavy menses and then has only had a couple of menstrual cycles this year.  She will go months without a cycle.  Her most recent 2 cycles were about a month apart.  Her bleeding is much lighter than it used to be, but requires a pad a little heavier than a panty liner.  She describes it as being thicker with some passage of clots.  She denies any intermenstrual bleeding.  She has some pain and cramping with her menses at baseline, has not been as often since her menstrual volume decreased. ?  ?Given menstrual changes, the patient underwent pelvic ultrasound in October that showed a 7.4 cm complex adnexal mass that had increased from a greatest diameter of 4 cm in November of last year.  CA-125 was normal, HE4 and Overa both elevated. ?  ?On 01/20/2021, the patient was taken to the operating room for a complex adnexal mass and abnormal uterine bleeding. 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 Due to the concern for possible bowel injury and the unanticipated bowel involvement without a bowel prep, the decision was made to stop the procedure  and return after adequate bowel prepping. On 01/22/2021, the patient returned to the operating room after having a bowel prep the day before for the following: Diagnostic laparoscopy, exploratory laparotomy with thorough inspection of small and large bowel (with Dr. Dema Severin), backfill of bladder, cytoscopy with retrograde pyelogram and attempted fistulogram (with Dr. Milford Cage). On 01/23/21, she had a CT scan AP followed by an MRI of the pelvis on 01/24/2021 to further evaluate the pelvis and bladder fistula. ?  ?The postoperative course included prophylactic IV cefoxitin and flagyl for 24 hours post-op, foley catheter with no difficulties voiding after removal, superficial abdominal incisional separation with twice daily dressing changes, post-operative ileus requiring NG tube placement on 01/26/2021 with removal the following day in the evening.  She was discharged to home on postoperative day 6 (from 2nd operation).  ? ?Interval History: ?Since her last visit, oscillates between having urinary tract infection-like symptoms and not.  Azo helps some with the symptoms as to drinking water.  She has noticed some urinary incontinence, more noticeable at particular times than others.  Is not sure if this is leaking from her urethra or from within her vagina.  Her urine can be clear and at times is very cloudy or looks like it has particulate in it.  She denies any vaginal bleeding or menses since surgery. ? ?Bowel function has improved significantly since surgery.  She notes being very flatulent for the first 2 months after.  She has intermittent bloating now and her abdomen gets very firm at times.  She has been able to lose some weight and notes that the intermittent bloating seems to be more noticeable now since  surgery than it was previously. ? ?Past Medical/Surgical History: ?Past Medical History:  ?Diagnosis Date  ? Arthritis   ? Bicornuate uterus   ? Hemorrhoids 11/18/2020  ? History of abnormal cervical Pap smear   ?  Hypertriglyceridemia 11/18/2020  ? Hypothyroidism   ? Rheumatoid aortitis   ? Thyroid disease   ? Umbilical hernia 70/62/3762  ? ? ?Past Surgical History:  ?Procedure Laterality Date  ? ADENOIDECTOMY  02/22/1982  ? CESAREAN SECTION  04/07/2005  ? CESAREAN SECTION  09/24/2011  ? cyst N/A 12/15/2020  ? 2 scalp cysts removed, benign  ? CYSTOSCOPY W/ RETROGRADES  01/22/2021  ? Procedure: CYSTOSCOPY WITH RETROGRADE PYELOGRAM;FISTULOGRAM;  Surgeon: Remi Haggard, MD;  Location: [facility];  Service: Urology;;  ? [place] N/A 01/20/2021  ? Procedure: [place];  Surgeon: Lafonda Mosses, MD;  Location: [facility];  Service: Gynecology;  Laterality: N/A;  ? DILATION AND CURETTAGE, DIAGNOSTIC / THERAPEUTIC  12/23/2012  ? FLEXIBLE SIGMOIDOSCOPY N/A 01/22/2021  ? Procedure: DIAGNOSTIC FLEXIBLE SIGMOIDOSCOPY;  Surgeon: Ileana Roup, MD;  Location: [facility];  Service: General;  Laterality: N/A;  ? LAPAROTOMY N/A 01/22/2021  ? Procedure: EXPLORATORY LAPAROTOMY;  Surgeon: Lafonda Mosses, MD;  Location: [facility];  Service: Gynecology;  Laterality: N/A;  ? LAPAROTOMY N/A 01/22/2021  ? Procedure: EXPLORATORY LAPAROTOMY;  Surgeon: Ileana Roup, MD;  Location: [facility];  Service: General;  Laterality: N/A;  ? MYRINGOTOMY WITH TUBE PLACEMENT    ? TUBAL LIGATION Bilateral 12/23/2012  ? WISDOM TOOTH EXTRACTION  02/22/1993  ? XI ROBOTIC ASSISTED SALPINGECTOMY Right 01/20/2021  ? Procedure: XI ROBOTIC ASSISTED RIGHT  SALPINGECTOMY, LEFT OVARIAN BIOPSY;  Surgeon: Lafonda Mosses, MD;  Location: [facility];  Service: Gynecology;  Laterality: Right;  ? ? ?Family History  ?Problem Relation Age of Onset  ? Diabetes Father   ? Leukemia Father   ? Heart disease Maternal Grandmother   ? Heart disease Maternal Grandfather   ? Heart disease Paternal Grandmother   ? Skin cancer Other   ? Colon cancer Neg Hx   ? Breast cancer Neg Hx   ? Ovarian cancer Neg Hx   ?  Endometrial cancer Neg Hx   ? Prostate cancer Neg Hx   ? Pancreatic cancer Neg Hx   ? ? ?Social History  ? ?Socioeconomic History  ? Marital status: Married  ?  Spouse name: Not on file  ? Number of children: Not on file  ? Years of education: Not on file  ? Highest education level: Not on file  ?Occupational History  ? Not on file  ?Tobacco Use  ? Smoking status: Former  ?  Packs/day: 0.25  ?  Years: 20.00  ?  Pack years: 5.00  ?  Types: Cigarettes  ?  Quit date: 2013  ?  Years since quitting: 10.3  ? Smokeless tobacco: Never  ?Vaping Use  ? Vaping Use: Never used  ?Substance and Sexual Activity  ? Alcohol use: Yes  ?  Comment: Occasionally  ? Drug use: Never  ? Sexual activity: Yes  ?  Birth control/protection: Surgical  ?  Comment: TL  ?Other Topics Concern  ? Not on file  ?Social History Narrative  ? Not on file  ? ?Social Determinants of Health  ? ?Financial Resource Strain: Not on file  ?Food Insecurity: Not on file  ?Transportation Needs: Not on file  ?Physical Activity: Not on file  ?Stress: Not on file  ?  Social Connections: Not on file  ? ? ?Current Medications: ? ?Current Outpatient Medications:  ?  levothyroxine (SYNTHROID) 150 MCG tablet, 1 tablet in the morning on an empty stomach, Disp: , Rfl:  ? ?Review of Systems: ?+ Urinary incontinence, hot flashes, anxiety, depression. ?Denies appetite changes, fevers, chills, fatigue, unexplained weight changes. ?Denies hearing loss, neck lumps or masses, mouth sores, ringing in ears or voice changes. ?Denies cough or wheezing.  Denies shortness of breath. ?Denies chest pain or palpitations. Denies leg swelling. ?Denies abdominal distention, pain, blood in stools, constipation, diarrhea, nausea, vomiting, or early satiety. ?Denies pain with intercourse, dysuria, frequency, hematuria. ?Denies pelvic pain, vaginal bleeding or vaginal discharge.   ?Denies joint pain, back pain or muscle pain/cramps. ?Denies itching, rash, or wounds. ?Denies dizziness, headaches,  numbness or seizures. ?Denies swollen lymph nodes or glands, denies easy bruising or bleeding. ?Denies confusion, or decreased concentration. ? ?Physical Exam: ?BP 107/75 (BP Location: Left Arm, Patient Po

## 2021-06-12 NOTE — Patient Instructions (Addendum)
It was good to see you today. ? ?I will call you when I get your MRI results.  Depending on what the imaging shows, we will discuss if we need to get any other imaging.  We can also discuss having you see the urologist. ?

## 2021-06-15 DIAGNOSIS — R748 Abnormal levels of other serum enzymes: Secondary | ICD-10-CM | POA: Diagnosis not present

## 2021-06-15 DIAGNOSIS — E039 Hypothyroidism, unspecified: Secondary | ICD-10-CM | POA: Diagnosis not present

## 2021-06-15 DIAGNOSIS — D4989 Neoplasm of unspecified behavior of other specified sites: Secondary | ICD-10-CM | POA: Diagnosis not present

## 2021-06-19 ENCOUNTER — Ambulatory Visit (HOSPITAL_COMMUNITY)
Admission: RE | Admit: 2021-06-19 | Discharge: 2021-06-19 | Disposition: A | Discharge status: Home / Self Care | Payer: BC Managed Care – PPO | Source: Ambulatory Visit | Attending: Gynecologic Oncology | Admitting: Gynecologic Oncology

## 2021-06-19 DIAGNOSIS — N9489 Other specified conditions associated with female genital organs and menstrual cycle: Secondary | ICD-10-CM

## 2021-06-19 DIAGNOSIS — N83202 Unspecified ovarian cyst, left side: Secondary | ICD-10-CM | POA: Diagnosis not present

## 2021-06-19 DIAGNOSIS — R1909 Other intra-abdominal and pelvic swelling, mass and lump: Secondary | ICD-10-CM | POA: Diagnosis not present

## 2021-06-19 IMAGING — MR MR PELVIS WO/W CM
20 series · 48 of 48 positions shown · IV contrast (8 GADAVIST)
Comparison: MRI [DATE].  CT scan [DATE]

CLINICAL DATA: Adnexal mass.

EXAM:
MRI PELVIS WITHOUT AND WITH CONTRAST
TECHNIQUE: Multiplanar multisequence MR imaging of the pelvis was performed
both before and after administration of intravenous contrast.
CONTRAST:  8mL GADAVIST GADOBUTROL 1 MMOL/ML IV SOLN

[Series 2: T2 · coronal · 6.0mm · 1.56mm/px · 1 of 38 slices shown (1 of 4)]
[im 1/38]
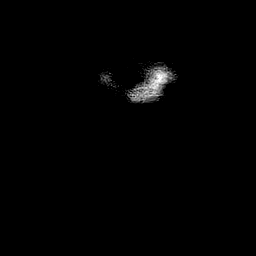

[Series 3: T2 · axial · 5.0mm · 0.51mm/px · 1 of 40 slices shown (2 of 4)]
[im 1/40]
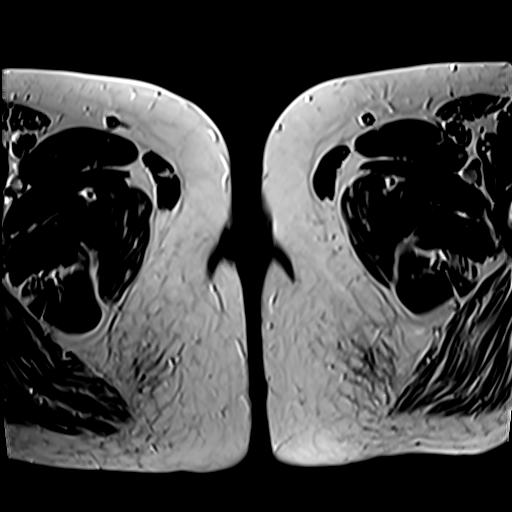

[Series 4: T2 · coronal · 4.0mm · 0.51mm/px · 1 of 41 slices shown (3 of 4)]
[im 1/41]
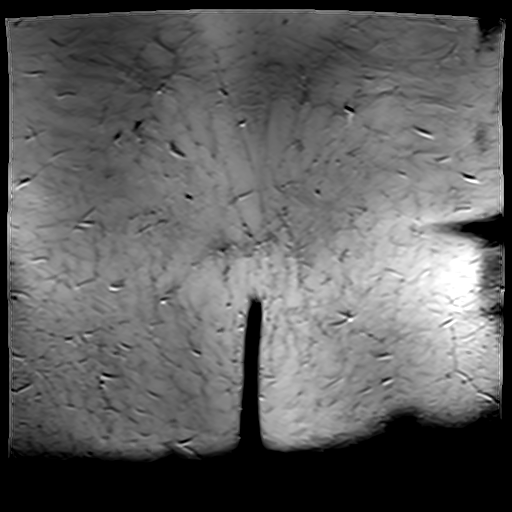

[Series 5: T2 fat-sat · axial · 5.0mm · 0.51mm/px · 1 of 40 slices shown]
[im 1/40]
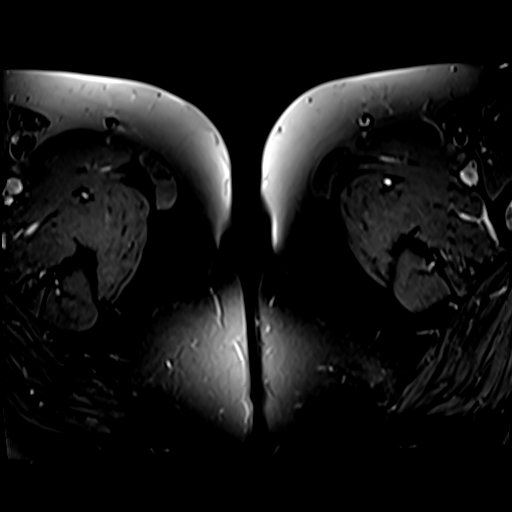

[Series 6: T2 · sagittal · 5.0mm · 0.55mm/px · 1 of 40 slices shown (4 of 4)]
[im 1/40]
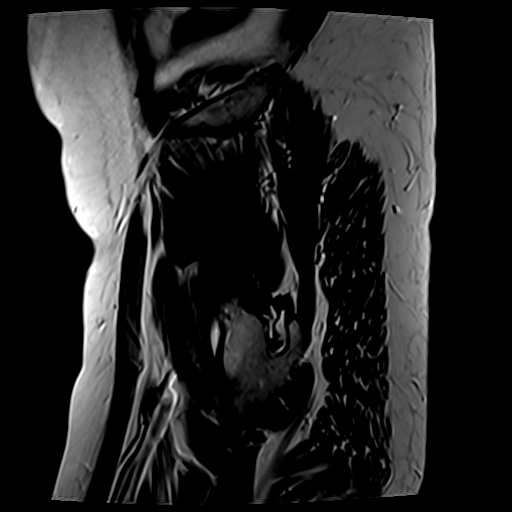

[Series 7: T1 · axial · 4.0mm · 0.84mm/px · z∈[-136,+148]mm · 3 of 72 slices shown (1 of 2)]
[im 1/72]
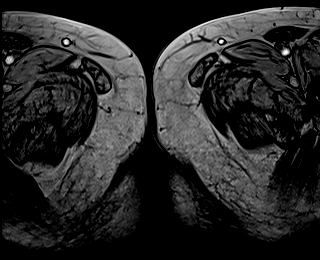
[im 36/72]
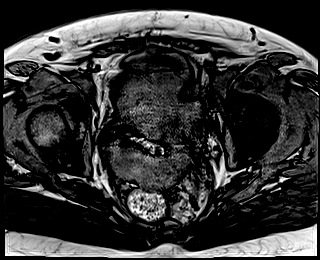
[im 72/72]
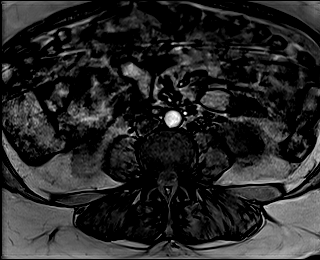

[Series 8: T1 · axial · 4.0mm · 0.84mm/px · z∈[-136,+148]mm · 3 of 72 slices shown (2 of 2)]
[im 1/72]
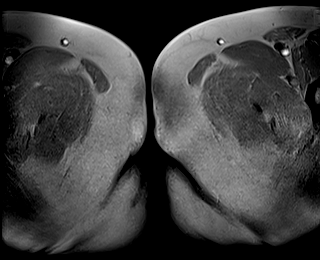
[im 36/72]
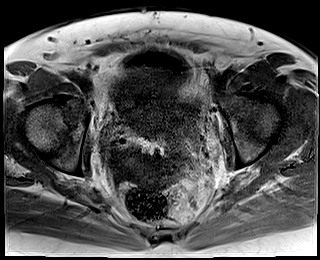
[im 72/72]
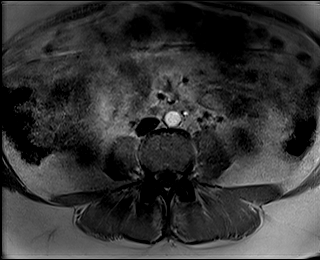

[Series 9: DWI · axial · 5.0mm · 2.80mm/px · z∈[-108,+117]mm · 5 of 137 slices shown (1 of 3)]
[im 1/137]
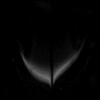
[im 35/137]
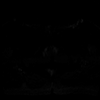
[im 69/137]
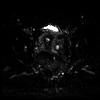
[im 103/137]
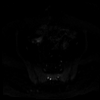
[im 137/137]
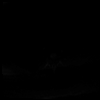

[Series 10: DWI · axial · 5.0mm · 2.80mm/px · z∈[-108,+117]mm · 2 of 46 slices shown (2 of 3)]
[im 1/46]
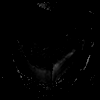
[im 46/46]
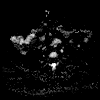

[Series 11: DWI · axial · 5.0mm · 2.80mm/px · z∈[-108,+117]mm · 2 of 46 slices shown (3 of 3)]
[im 1/46]
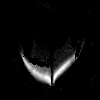
[im 46/46]
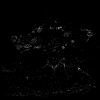

[Series 13: T1 dynamic · axial · 3.0mm · 0.84mm/px · z∈[-103,+134]mm · 3 of 80 slices shown (1 of 7)]
[im 1/80]
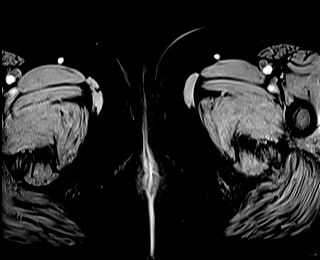
[im 40/80]
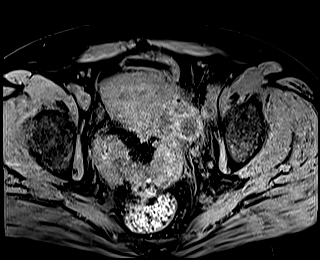
[im 80/80]
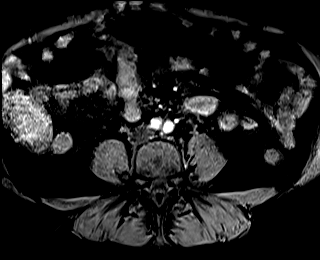

[Series 17: T1 dynamic · axial · 3.0mm · 0.84mm/px · z∈[-103,+134]mm · 3 of 80 slices shown (2 of 7)]
[im 1/80]
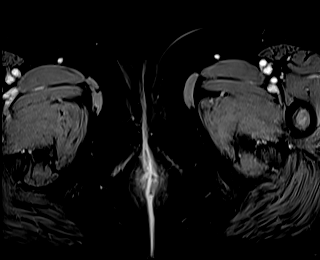
[im 40/80]
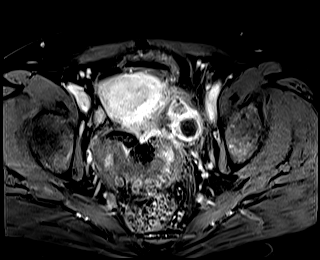
[im 80/80]
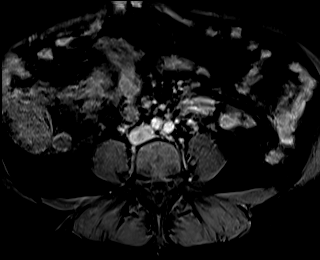

[Series 18: T1 dynamic · axial · 3.0mm · 0.84mm/px · z∈[-103,+134]mm · 3 of 78 slices shown (3 of 7)]
[im 1/78]
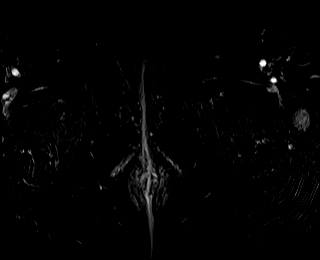
[im 39/78]
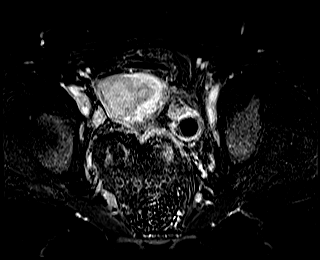
[im 78/78]
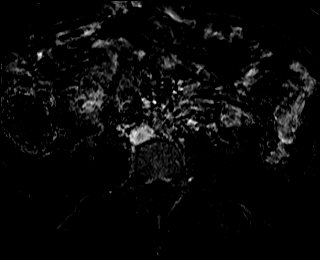

[Series 21: T1 dynamic · axial · 3.0mm · 0.84mm/px · z∈[-103,+134]mm · 3 of 80 slices shown (4 of 7)]
[im 1/80]
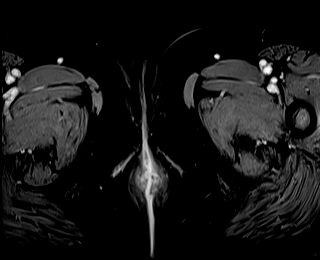
[im 40/80]
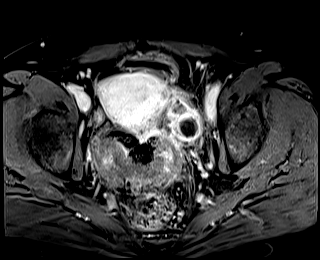
[im 80/80]
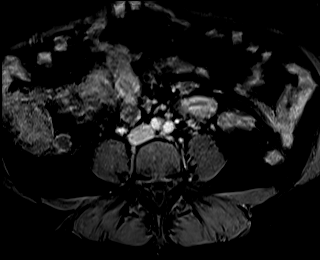

[Series 22: T1 dynamic · axial · 3.0mm · 0.84mm/px · z∈[-103,+134]mm · 3 of 79 slices shown (5 of 7)]
[im 1/79]
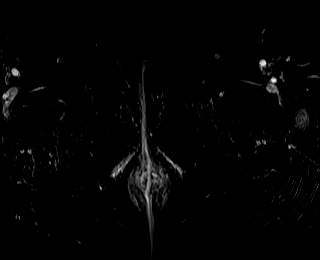
[im 40/79]
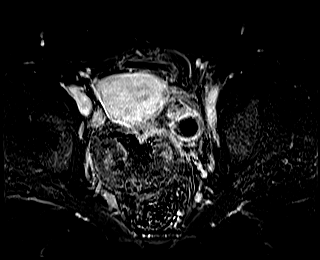
[im 79/79]
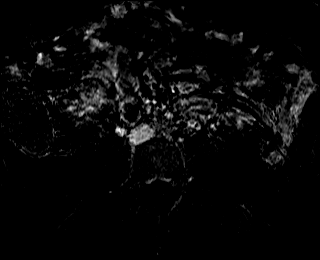

[Series 25: T1 dynamic · axial · 3.0mm · 0.84mm/px · z∈[-103,+134]mm · 3 of 80 slices shown (6 of 7)]
[im 1/80]
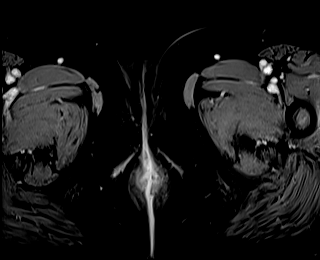
[im 40/80]
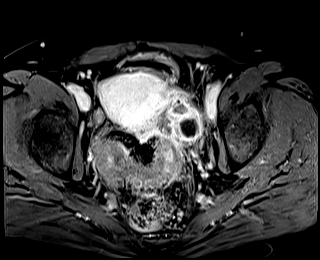
[im 80/80]
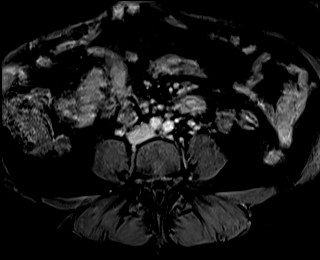

[Series 26: T1 dynamic · axial · 3.0mm · 0.84mm/px · z∈[-103,+134]mm · 3 of 79 slices shown (7 of 7)]
[im 1/79]
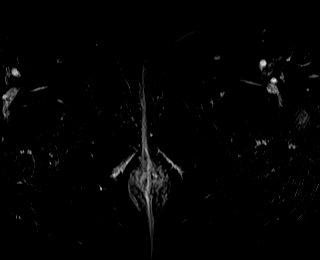
[im 40/79]
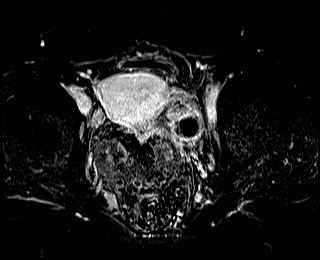
[im 79/79]
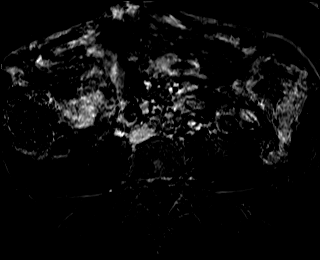

[Series 28: T1 dynamic post-contrast · axial · 3.0mm · 1.06mm/px · z∈[-113,+124]mm · 3 of 80 slices shown (1 of 2)]
[im 1/80]
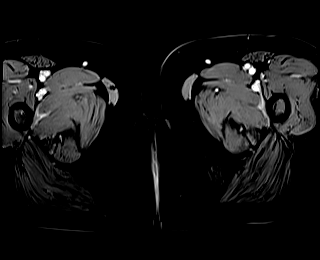
[im 40/80]
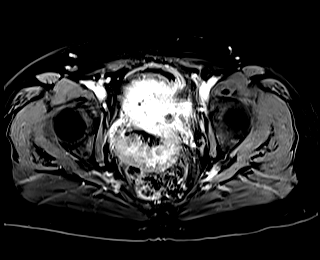
[im 80/80]
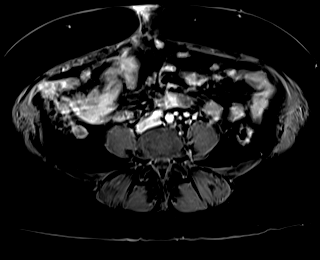

[Series 29: T2 post-contrast · sagittal · 5.0mm · 0.55mm/px · 1 of 40 slices shown]
[im 1/40]
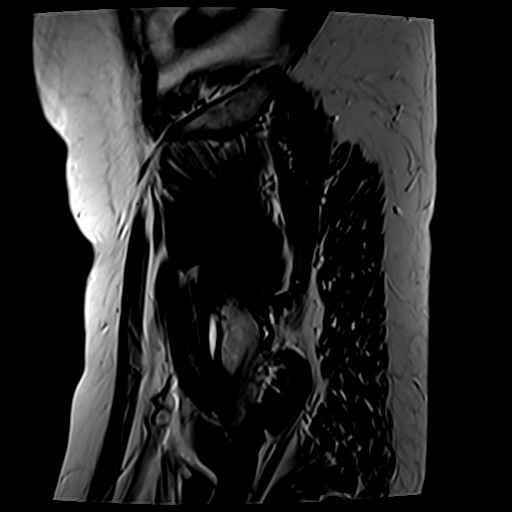

[Series 31: T1 dynamic post-contrast · sagittal · 3.0mm · 0.88mm/px · 3 of 80 slices shown (2 of 2)]
[im 1/80]
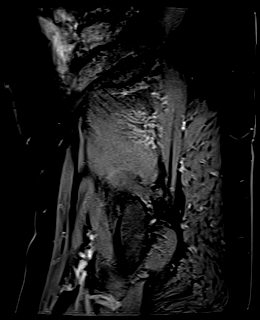
[im 40/80]
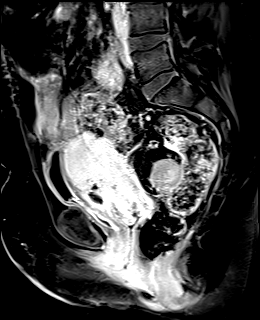
[im 80/80]
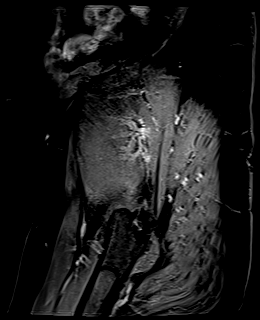

[48 of 48 positions shown; findings below may reference images not displayed]

FINDINGS: Urinary Tract: Bladder is decompressed. 1.8 x 1.6 x 4.3 cm
relatively thick walled rim enhancing collection is seen involving
the posterior bladder wall tracking to the left between the
posterior bladder wall in the anterior uterus towards the left
adnexal space in the multicystic left adnexal process.

Bowel:  Unremarkable.

Vascular/Lymphatic: Small left perirectal lymph nodes evident with
upper normal lymph nodes seen in the left iliac chains. No
significant vascular abnormality seen.

Reproductive: Uterus measures 4.5 x 9.7 x 6.1 cm. Endometrial canal
is divergent towards the cornua, likely related to arcuate anatomy
although septate uterus cannot be excluded based on imaging planes
today.

Right ovary is high in the pelvis measuring 3.8 x 3.5 x 1.7 cm and
has normal MR imaging characteristics.

Left ovary is not discretely identified. 4.3 x 4.1 x 3.7 cm complex
cystic and solid lesion identified left adnexal space is slightly
decreased in size in the interval.

Other:  Trace free fluid noted in the pelvis.

Musculoskeletal: No focal suspicious marrow enhancement within the
visualized bony anatomy.
IMPRESSION: 1. 4.3 x 4.1 x 3.7 cm complex cystic and solid lesion in the left
adnexal space is slightly decreased in size in the interval.
2. 1.8 x 1.6 x 4.3 cm relatively thick walled rim enhancing
collection involving the posterior bladder wall tracking to the left
between the posterior bladder wall and the anterior uterus towards
the left adnexal space. This may reflect the appearance bladder
fistula seen on exploratory laparotomy [DATE]. Overall
appearance is similar to previous MRI.
3. Endometrial canal is divergent towards the cornua, likely related
to arcuate anatomy although septate uterus cannot be excluded based
on imaging planes today.
4. Trace free fluid in the pelvis.

## 2021-06-19 MED ORDER — GADOBUTROL 1 MMOL/ML IV SOLN
8.0000 mL | Freq: Once | INTRAVENOUS | Status: AC | PRN
  Administered 2021-06-19: 8 mL via INTRAVENOUS

## 2021-06-22 ENCOUNTER — Telehealth: Payer: Self-pay | Admitting: Gynecologic Oncology

## 2021-06-22 NOTE — Telephone Encounter (Signed)
Called the patient to discuss her MRI results, which I released to her with a comment over the weekend.  No answer.  Left message requesting callback. ? ?Jeral Pinch MD ?Gynecologic Oncology ? ?

## 2021-06-24 ENCOUNTER — Telehealth: Payer: Self-pay

## 2021-06-24 NOTE — Telephone Encounter (Signed)
Received call from Brittney Herrera this morning. Patient apologized for not being able to speak with Dr. Berline Lopes the other day. She is calling to review her MRI results. Advised patient that Dr. Berline Lopes is in the OR today but the message will be given to her. Patient states " Ok, she can call me later today or Friday. Either is fine. I saw her mychart message."  ?

## 2021-06-26 ENCOUNTER — Telehealth: Payer: Self-pay | Admitting: Gynecologic Oncology

## 2021-06-26 DIAGNOSIS — N322 Vesical fistula, not elsewhere classified: Secondary | ICD-10-CM

## 2021-06-26 DIAGNOSIS — N9489 Other specified conditions associated with female genital organs and menstrual cycle: Secondary | ICD-10-CM

## 2021-06-26 NOTE — Telephone Encounter (Signed)
Called patient, discussed MRI results. Decrease size of adnexal mass reassuring that this is unlikely to be a malignancy. Plan to repeat MRI in 6 months. Will let me know if new/worsening symptoms develop. ? ?Jeral Pinch MD ?Gynecologic Oncology ? ?

## 2021-07-02 ENCOUNTER — Telehealth: Payer: Self-pay | Admitting: Gynecologic Oncology

## 2021-07-02 ENCOUNTER — Telehealth: Payer: Self-pay | Admitting: *Deleted

## 2021-07-02 ENCOUNTER — Encounter: Payer: Self-pay | Admitting: Gynecologic Oncology

## 2021-07-02 NOTE — Telephone Encounter (Signed)
Attempted to speak with pt this afternoon but unable to reach her. LVM for return call.  ?

## 2021-07-02 NOTE — Telephone Encounter (Signed)
Per Dr Berline Lopes called and spoke to the patient regarding her scan in November. Patient will check with her insurance and call the office back with a location.  ? ?Patient stated "Last time Dr Berline Lopes and I talked she mentioned something about seeing an urologist."  ?

## 2021-07-03 NOTE — Telephone Encounter (Signed)
LVM for pt to call back regarding a possible referral to Urology ?

## 2021-07-06 ENCOUNTER — Telehealth: Payer: Self-pay | Admitting: *Deleted

## 2021-07-06 NOTE — Telephone Encounter (Signed)
Attempted to speak with pt regarding clarifying if she wants a referral to a urologist. Unable to reach pt. LVM for return call.  ?

## 2021-07-06 NOTE — Telephone Encounter (Signed)
Spoke with pt this afternoon who stated that she would like to hold off on another urologist referral right now and would like to do repeat imagining instead so we have 2 imagining to compare and then decide if she wants another referral. She would like to have the repeat imagining done in September since she has already met her deductible for the year. Informed her we would let Dr.Tucker know. She verbalized understanding.  ?

## 2021-07-14 ENCOUNTER — Telehealth: Payer: Self-pay | Admitting: *Deleted

## 2021-07-14 NOTE — Telephone Encounter (Signed)
Called and left the patient a message to call the office regarding her scan. Need to when her deductible runs through. Patient needs MRI in October per Dr Berline Lopes but patient is requesting September.

## 2021-07-15 NOTE — Telephone Encounter (Signed)
Called and left the patient a message to call the office regarding her scan. Need to when her deductible runs through. Patient needs MRI in October per Dr Berline Lopes but patient is requesting September

## 2021-07-16 ENCOUNTER — Encounter: Payer: Self-pay | Admitting: Gynecologic Oncology

## 2021-07-16 NOTE — Telephone Encounter (Signed)
Called and spoke with the patient regarding her scan; patient request the scan is September sue to insurance and Dr Berline Lopes wants the scan in October.  Patient stated "I the last scan broke me. It was $4000 dollars plus what I had already paid out of pocket. I don't understand why the scan was cheaper when I was in the hospital. I already had to take a loan out on my house for the big surgery, which I know would be huge. But $4000 for 30 minutes is crazy. I'm just done, I'm ready to throw in the towel.I have no way to pay my bills. I can't buy food for my kids.  I don't think you all understand the financial hardship this has put on me.  You all took my kids college fund." Tried to ask the patient when her insurance deductible runs through and the patient stated "that one scan made me hit the deductible."   Tried to explain to the patient that we could check with her insurance to see if a free standing facility would be cheaper; and hope that the insurance company would talk to [place]. Offered the patient comfort and the we are right here with her.  Explained that our office would reach out to the financial department and social for some help.   Explained that the message would be given to the provider and the office would call her back. Patient stated "don't brother her she has bigger fish to fry." Explained that Dr Berline Lopes and the office is right here if her and she is has important has anyone. Patient was very tearful through the call.

## 2021-07-17 ENCOUNTER — Encounter: Payer: Self-pay | Admitting: General Practice

## 2021-07-17 NOTE — Progress Notes (Signed)
[facility] Spiritual Care Note  Referred for emotional support related to financial distress. Spoke with Brittney Herrera by phone, providing empathic listening, emotional support, and encouragement as she shared and processed the difficult financial aspects of her healthcare experience. She welcomed opportunity to have a conversation partner and witness to her story. We plan to follow up in ca one month for another check-in, and she has my direct number in case needs arise in the meantime.   [place], [place], [facility] Pager [phone] Voicemail [phone]

## 2021-07-21 ENCOUNTER — Telehealth: Payer: Self-pay | Admitting: Gynecologic Oncology

## 2021-07-21 NOTE — Telephone Encounter (Signed)
Called the patient to talk by phone as she does not appear to have read my last MyChart message.  No answer.  Left message requesting either call back or return of MyChart message if easier.  Jeral Pinch MD Gynecologic Oncology

## 2021-07-24 NOTE — Telephone Encounter (Signed)
Spoke with the patient and explained that "our office has been trying to reach her insurance company regarding the scan. We just sit on hold forever." Patient sated that "She knows they take forever and she would try and check later on line. But things ae not good right now. My daughter was attacked by a dog last night and we were at the hospital all night."   Spoke with the patient and patient agrees that the office will call her back in August to discuss when to scheduled her scan. The scan will be either in September or October; also will have the order for [facility] and run through [employer].

## 2021-08-20 ENCOUNTER — Encounter: Payer: Self-pay | Admitting: General Practice

## 2021-08-20 NOTE — Progress Notes (Signed)
[facility] Spiritual Care Note  Connected with [place] by phone for check-in as planned, but she was exhausted from poor sleep due to hot flashes. We plan to follow up next week instead.   [place], [place], [facility] Pager [phone] Voicemail [phone]

## 2021-08-24 ENCOUNTER — Encounter: Payer: Self-pay | Admitting: General Practice

## 2021-08-24 NOTE — Progress Notes (Signed)
[facility] Spiritual Care Note  Attempted follow-up again. Left voicemail requesting return call.   [place], [place], [facility] Pager [phone] Voicemail [phone]

## 2021-08-31 DIAGNOSIS — E039 Hypothyroidism, unspecified: Secondary | ICD-10-CM | POA: Diagnosis not present

## 2021-08-31 DIAGNOSIS — R748 Abnormal levels of other serum enzymes: Secondary | ICD-10-CM | POA: Diagnosis not present

## 2021-09-14 DIAGNOSIS — D4989 Neoplasm of unspecified behavior of other specified sites: Secondary | ICD-10-CM | POA: Diagnosis not present

## 2021-09-14 DIAGNOSIS — R748 Abnormal levels of other serum enzymes: Secondary | ICD-10-CM | POA: Diagnosis not present

## 2021-09-14 DIAGNOSIS — E039 Hypothyroidism, unspecified: Secondary | ICD-10-CM | POA: Diagnosis not present

## 2021-10-30 ENCOUNTER — Telehealth: Payer: Self-pay | Admitting: *Deleted

## 2021-10-30 NOTE — Telephone Encounter (Signed)
Patient called and wanted to set up a telephone visit with Dr Berline Lopes. Phone visit scheduled for 9/12. 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 It has since moved to the right and is the size of the a golf ball. It pops in and out and gets worse through out the day." Explained that the message would be given to Dr Berline Lopes

## 2021-10-30 NOTE — Telephone Encounter (Signed)
Spoke with the patient and scheduled a in person visit for 9/11 at 1:15 pm

## 2021-11-02 ENCOUNTER — Encounter: Payer: Self-pay | Admitting: Gynecologic Oncology

## 2021-11-02 ENCOUNTER — Other Ambulatory Visit: Payer: Self-pay

## 2021-11-02 ENCOUNTER — Inpatient Hospital Stay: Payer: BC Managed Care – PPO | Attending: Gynecologic Oncology | Admitting: Gynecologic Oncology

## 2021-11-02 VITALS — BP 149/103 | HR 93 | Temp 98.2°F | Resp 16 | Ht 63.39 in | Wt 180.6 lb

## 2021-11-02 DIAGNOSIS — D398 Neoplasm of uncertain behavior of other specified female genital organs: Secondary | ICD-10-CM | POA: Diagnosis not present

## 2021-11-02 DIAGNOSIS — R399 Unspecified symptoms and signs involving the genitourinary system: Secondary | ICD-10-CM | POA: Diagnosis not present

## 2021-11-02 DIAGNOSIS — N821 Other female urinary-genital tract fistulae: Secondary | ICD-10-CM | POA: Diagnosis not present

## 2021-11-02 DIAGNOSIS — K432 Incisional hernia without obstruction or gangrene: Secondary | ICD-10-CM | POA: Diagnosis not present

## 2021-11-02 DIAGNOSIS — L729 Follicular cyst of the skin and subcutaneous tissue, unspecified: Secondary | ICD-10-CM | POA: Insufficient documentation

## 2021-11-02 DIAGNOSIS — N9489 Other specified conditions associated with female genital organs and menstrual cycle: Secondary | ICD-10-CM

## 2021-11-02 DIAGNOSIS — N322 Vesical fistula, not elsewhere classified: Secondary | ICD-10-CM

## 2021-11-02 NOTE — Patient Instructions (Addendum)
It was good to see you today. I will call you with your MRI results.  Referral has been placed for the plastic surgery office. Please call them to get scheduled (will be faster than you waiting for them to call you).

## 2021-11-02 NOTE — Progress Notes (Signed)
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   Patient reports presenting recently for gynecologic evaluation secondary to a 2-year history of abnormal menses.  She notes regular menses until last year when her menstrual cycles became longer and heavier.  Last November, she had a long, heavy menses and then has only had a couple of menstrual cycles this year.  She will go months without a cycle.  Her most recent 2 cycles were about a month apart.  Her bleeding is much lighter than it used to be, but requires a pad a little heavier than a panty liner.  She describes it as being thicker with some passage of clots.  She denies any intermenstrual bleeding.  She has some pain and cramping with her menses at baseline, has not been as often since her menstrual volume decreased.   Given menstrual changes, the patient underwent pelvic ultrasound in October that showed a 7.4 cm complex adnexal mass that had increased from a greatest diameter of 4 cm in November of last year.  CA-125 was normal, HE4 and Overa both elevated.   On 01/20/2021, the patient was taken to the operating room for a complex adnexal mass and abnormal uterine bleeding. 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 Due to the concern for possible bowel injury and the unanticipated bowel involvement without a bowel prep, the decision was made to stop the procedure and return  after adequate bowel prepping. On 01/22/2021, the patient returned to the operating room after having a bowel prep the day before for the following: Diagnostic laparoscopy, exploratory laparotomy with thorough inspection of small and large bowel (with Dr. Dema Severin), backfill of bladder, cytoscopy with retrograde pyelogram and attempted fistulogram (with Dr. Milford Cage). On 01/23/21, she had a CT scan AP followed by an MRI of the pelvis on 01/24/2021 to further evaluate the pelvis and bladder fistula.   The postoperative course included prophylactic IV cefoxitin and flagyl for 24 hours post-op, foley catheter with no difficulties voiding after removal, superficial abdominal incisional separation with twice daily dressing changes, post-operative ileus requiring NG tube placement on 01/26/2021 with removal the following day in the evening.  She was discharged to home on postoperative day 6 (from 2nd operation).   MRI pelvis on 06/19/21 revealed complex cystic and solid lesion in th left adnexa, slightly decrease in size. 1.8 x 1.6 x 4.3 cm thick walled rim enhancing collection involving posterior bladder wall tracking to the left between posterior bladder wall and anterior uterus toward the left adnexal space. Overall appearance is similar to previous MRI. Endometrial canal is divergent towards the cornua, likely related to arcuate anatomy versus septate uterus. Trace free fluid in the pelvis.   Interval History: Patient presents today for follow-up.  She notes overall doing well although over the last several months has developed a bulge along the midportion of her incision.  She feels that sometimes just of the right side of her incision and sometimes within her umbilicus.  She denies ever having a bulge that she is unable to reduce or that causes significant  tenderness with palpation.  She endorses normal bowel function, denies any nausea or emesis.  She has also recently developed another cyst on her scalp.  She has  a history of 1 of these that was removed previously by Dr. Claudia Desanctis.  After cessation of her menses for a period of time following her surgery, she has had menstrual cycle for the last 2 months.  Notes that this is significantly lighter than her period used to be.  Continues to have intermittent urinary symptoms.  She describes this as seeing from time to time particulate in her urine.  She also has occasional episodes where she will have burning externally at the urethral opening.  This is relieved with the use of Azo over-the-counter and by limiting her caffeine intake.  She has noticed that the symptoms seems related to consuming certain beverages and foods such as orange juice and caffeine.  Past Medical/Surgical History: Past Medical History:  Diagnosis Date   Arthritis    Bicornuate uterus    Hemorrhoids 11/18/2020   History of abnormal cervical Pap smear    Hypertriglyceridemia 11/18/2020   Hypothyroidism    Rheumatoid aortitis    Thyroid disease    Umbilical hernia 85/03/7739    Past Surgical History:  Procedure Laterality Date   ADENOIDECTOMY  02/22/1982   CESAREAN SECTION  04/07/2005   CESAREAN SECTION  09/24/2011   cyst N/A 12/15/2020   2 scalp cysts removed, benign   CYSTOSCOPY W/ RETROGRADES  01/22/2021   Procedure: CYSTOSCOPY WITH RETROGRADE PYELOGRAM;FISTULOGRAM;  Surgeon: Remi Haggard, MD;  Location: [facility];  Service: Urology;;   [place] N/A 01/20/2021   Procedure: [place];  Surgeon: Lafonda Mosses, MD;  Location: [facility];  Service: Gynecology;  Laterality: N/A;   DILATION AND CURETTAGE, DIAGNOSTIC / THERAPEUTIC  12/23/2012   FLEXIBLE SIGMOIDOSCOPY N/A 01/22/2021   Procedure: DIAGNOSTIC FLEXIBLE SIGMOIDOSCOPY;  Surgeon: Ileana Roup, MD;  Location: [facility];  Service: General;  Laterality: N/A;   LAPAROTOMY N/A 01/22/2021   Procedure: EXPLORATORY LAPAROTOMY;  Surgeon: Lafonda Mosses, MD;  Location: [facility];  Service: Gynecology;  Laterality: N/A;   LAPAROTOMY N/A 01/22/2021   Procedure: EXPLORATORY LAPAROTOMY;  Surgeon: Ileana Roup, MD;  Location: [facility];  Service: General;  Laterality: N/A;   MYRINGOTOMY WITH TUBE PLACEMENT     TUBAL LIGATION Bilateral 12/23/2012   WISDOM TOOTH EXTRACTION  02/22/1993   XI ROBOTIC ASSISTED SALPINGECTOMY Right 01/20/2021   Procedure: XI ROBOTIC ASSISTED RIGHT  SALPINGECTOMY, LEFT OVARIAN BIOPSY;  Surgeon: Lafonda Mosses, MD;  Location: [facility];  Service: Gynecology;  Laterality: Right;    Family History  Problem Relation Age of Onset   Diabetes Father    Leukemia Father    Heart disease Maternal Grandmother    Heart disease Maternal Grandfather    Heart disease Paternal Grandmother    Skin cancer Other    Colon cancer Neg Hx    Breast cancer Neg Hx    Ovarian cancer Neg Hx    Endometrial cancer Neg Hx    Prostate cancer Neg Hx    Pancreatic cancer Neg Hx     Social History   Socioeconomic History   Marital status: Married    Spouse name: Not on file   Number of children: Not on file   Years of education: Not on file   Highest education level: Not on file  Occupational History   Not on file  Tobacco Use   Smoking status: Former    Packs/day: 0.25    Years: 20.00    Total pack years: 5.00    Types: Cigarettes    Quit date: 2013    Years since quitting: 10.6   Smokeless tobacco: Never  Vaping Use   Vaping Use: Never used  Substance and Sexual Activity   Alcohol use: Yes    Comment: Occasionally   Drug use: Never   Sexual activity: Yes    Birth control/protection: Surgical    Comment: TL  Other Topics Concern   Not on file  Social History Narrative   Not on file   Social Determinants of Health   Financial Resource Strain: Not on file  Food Insecurity: Not on file  Transportation Needs: Not on file  Physical Activity: Not on file  Stress: Not on file  Social Connections: Not on  file    Current Medications:  Current Outpatient Medications:    levothyroxine (SYNTHROID) 150 MCG tablet, 1 tablet in the morning on an empty stomach, Disp: , Rfl:   Review of Systems: Denies appetite changes, fevers, chills, fatigue, unexplained weight changes. Denies hearing loss, neck lumps or masses, mouth sores, ringing in ears or voice changes. Denies cough or wheezing.  Denies shortness of breath. Denies chest pain or palpitations. Denies leg swelling. Denies abdominal distention, pain, blood in stools, constipation, diarrhea, nausea, vomiting, or early satiety. Denies pain with intercourse, frequency, hematuria or incontinence. Denies hot flashes, pelvic pain, vaginal bleeding or vaginal discharge.   Denies joint pain, back pain or muscle pain/cramps. Denies itching, rash, or wounds. Denies dizziness, headaches, numbness or seizures. Denies swollen lymph nodes or glands, denies easy bruising or bleeding. Denies anxiety, depression, confusion, or decreased concentration.  Physical Exam: BP (!) 149/103 (BP Location: Right Arm, Patient Position: Sitting)   Pulse 93   Temp 98.2 F (36.8 C) (Oral)   Resp 16   Ht 5' 3.39" (1.61 m)   Wt 180 lb 9.6 oz (81.9 kg)   SpO2 100%   BMI 31.60 kg/m  General: Alert, oriented, no acute distress. HEENT: Normocephalic, atraumatic, sclera anicteric. Chest: Unlabored breathing on room air. Abdomen: soft, nontender.  Approximately 3-4 cm hernia palpated just lateral to the right of her umbilicus and midline incision.  Normoactive bowel sounds.  No masses or hepatosplenomegaly appreciated.  Well-healed scar. Extremities: Grossly normal range of motion.  Warm, well perfused.  No edema bilaterally.  Laboratory & Radiologic Studies: None new  Assessment & Plan: Brittney Herrera is a 46 y.o. 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     Patient is overall doing well but has developed a small hernia along the midportion of her incision.  She has had no signs or symptoms related to incarceration or strangulation of small bowel.  We reviewed signs and symptoms that should prompt a phone call or presenting to the emergency department.  Discussed treatment options including continued close surveillance versus consideration of surgical repair.  She remains relatively asymptomatic with regard to her complex pelvic fistula.  Last imaging in April showed decreased size of her adnexal mass which communicated with her complex bladder fistula.  She continues to have intermittent urinary symptoms although not very bothersome.  We have previously had long  and extensive conversations about treatment options including surveillance versus surgery.  Surgery would involve at least a partial cystectomy, total hysterectomy, removal of at least the left adnexa, and possibly a rectal resection.  Patient still would like to avoid this large surgery if it is safe and possible.  Prior to considering evaluation for repair of her incisional hernia, I recommended that we get the MRI follow-up imaging that we had planned to do around this time to assess both her fistula and the adnexal lesion on the left.  If this remains stable with no significant change in size or appearance of the mass (and thus very low concern for any malignant process), then I would recommend referral to [facility] surgery for evaluation of surgical repair of her hernia.  Patient has a scalp cyst that has developed relatively recently.  She has a history of 1 of these that was removed by plastic surgery.  She is very happy with how she recovered subsequently.  I offered to place a referral to plastics today.  I will call the  patient once I have her MRI results and have looked at the images.  28 minutes of total time was spent for this patient encounter, including preparation, face-to-face counseling with the patient and coordination of care, and documentation of the encounter.  Jeral Pinch, MD  Division of Gynecologic Oncology  Department of Obstetrics and Gynecology  [facility]

## 2021-11-03 ENCOUNTER — Telehealth: Payer: BC Managed Care – PPO | Admitting: Gynecologic Oncology

## 2021-11-09 ENCOUNTER — Telehealth: Payer: Self-pay | Admitting: *Deleted

## 2021-11-09 NOTE — Telephone Encounter (Signed)
error 

## 2021-11-12 ENCOUNTER — Ambulatory Visit (INDEPENDENT_AMBULATORY_CARE_PROVIDER_SITE_OTHER): Payer: BC Managed Care – PPO | Admitting: Plastic Surgery

## 2021-11-12 ENCOUNTER — Encounter: Payer: Self-pay | Admitting: Plastic Surgery

## 2021-11-12 VITALS — BP 127/90 | HR 73 | Ht 63.0 in | Wt 177.0 lb

## 2021-11-12 DIAGNOSIS — D489 Neoplasm of uncertain behavior, unspecified: Secondary | ICD-10-CM

## 2021-11-12 DIAGNOSIS — D485 Neoplasm of uncertain behavior of skin: Secondary | ICD-10-CM | POA: Diagnosis not present

## 2021-11-13 ENCOUNTER — Ambulatory Visit (HOSPITAL_COMMUNITY)
Admission: RE | Admit: 2021-11-13 | Discharge: 2021-11-13 | Disposition: A | Discharge status: Home / Self Care | Payer: BC Managed Care – PPO | Source: Ambulatory Visit | Attending: Gynecologic Oncology | Admitting: Gynecologic Oncology

## 2021-11-13 DIAGNOSIS — N888 Other specified noninflammatory disorders of cervix uteri: Secondary | ICD-10-CM | POA: Diagnosis not present

## 2021-11-13 DIAGNOSIS — N83201 Unspecified ovarian cyst, right side: Secondary | ICD-10-CM | POA: Diagnosis not present

## 2021-11-13 DIAGNOSIS — N9489 Other specified conditions associated with female genital organs and menstrual cycle: Secondary | ICD-10-CM

## 2021-11-13 DIAGNOSIS — N8000 Endometriosis of the uterus, unspecified: Secondary | ICD-10-CM | POA: Diagnosis not present

## 2021-11-13 DIAGNOSIS — K6389 Other specified diseases of intestine: Secondary | ICD-10-CM | POA: Diagnosis not present

## 2021-11-13 MED ORDER — GADOBUTROL 1 MMOL/ML IV SOLN
8.0000 mL | Freq: Once | INTRAVENOUS | Status: AC | PRN
  Administered 2021-11-13: 8 mL via INTRAVENOUS

## 2021-11-13 NOTE — Progress Notes (Signed)
Referring Provider Everardo Beals, NP [address] [place],  [place] [address]   CC:  Left scalp cyst   Brittney Herrera is an 46 y.o. female.  HPI: 46 year old with left scalp cyst its been present for approximately 5 months.  She does have some pain with pressure.  It is gotten larger in size.  It is never been biopsied.    Allergies  Allergen Reactions   Prednisone Other (See Comments)    Facial swelling     Outpatient Encounter Medications as of 11/12/2021  Medication Sig   levothyroxine (SYNTHROID) 150 MCG tablet 1 tablet in the morning on an empty stomach   No facility-administered encounter medications on file as of 11/12/2021.     Past Medical History:  Diagnosis Date   Arthritis    Bicornuate uterus    Hemorrhoids 11/18/2020   History of abnormal cervical Pap smear    Hypertriglyceridemia 11/18/2020   Hypothyroidism    Rheumatoid aortitis    Thyroid disease    Umbilical hernia 25/85/2778    Past Surgical History:  Procedure Laterality Date   ADENOIDECTOMY  02/22/1982   CESAREAN SECTION  04/07/2005   CESAREAN SECTION  09/24/2011   cyst N/A 12/15/2020   2 scalp cysts removed, benign   CYSTOSCOPY W/ RETROGRADES  01/22/2021   Procedure: CYSTOSCOPY WITH RETROGRADE PYELOGRAM;FISTULOGRAM;  Surgeon: Remi Haggard, MD;  [facility]: [facility];  Service: Urology;;   [place] N/A 01/20/2021   Procedure: [place];  Surgeon: Lafonda Mosses, MD;  [facility]: [facility];  Service: Gynecology;  Laterality: N/A;   DILATION AND CURETTAGE, DIAGNOSTIC / THERAPEUTIC  12/23/2012   FLEXIBLE SIGMOIDOSCOPY N/A 01/22/2021   Procedure: DIAGNOSTIC FLEXIBLE SIGMOIDOSCOPY;  Surgeon: Ileana Roup, MD;  [facility];  Service: General;  Laterality: N/A;   LAPAROTOMY N/A 01/22/2021   Procedure: EXPLORATORY LAPAROTOMY;  Surgeon: Lafonda Mosses, MD;  [facility];  Service:  Gynecology;  Laterality: N/A;   LAPAROTOMY N/A 01/22/2021   Procedure: EXPLORATORY LAPAROTOMY;  Surgeon: Ileana Roup, MD;  [facility]: [facility];  Service: General;  Laterality: N/A;   MYRINGOTOMY WITH TUBE PLACEMENT     TUBAL LIGATION Bilateral 12/23/2012   WISDOM TOOTH EXTRACTION  02/22/1993   XI ROBOTIC ASSISTED SALPINGECTOMY Right 01/20/2021   Procedure: XI ROBOTIC ASSISTED RIGHT  SALPINGECTOMY, LEFT OVARIAN BIOPSY;  Surgeon: Lafonda Mosses, MD;  Location: [facility];  Service: Gynecology;  Laterality: Right;    Family History  Problem Relation Age of Onset   Diabetes Father    Leukemia Father    Heart disease Maternal Grandmother    Heart disease Maternal Grandfather    Heart disease Paternal Grandmother    Skin cancer Other    Colon cancer Neg Hx    Breast cancer Neg Hx    Ovarian cancer Neg Hx    Endometrial cancer Neg Hx    Prostate cancer Neg Hx    Pancreatic cancer Neg Hx     Social History   Social History Narrative   Not on file     Review of Systems General: Denies fevers, chills, weight loss CV: Denies chest pain, shortness of breath, palpitations   Physical Exam    11/12/2021    1:24 PM 11/02/2021    1:13 PM 06/12/2021    1:56 PM  Vitals with BMI  Height '5\' 3"'$  5' 3.386" 5' 4.961"  Weight 177 lbs 180 lbs 10 oz 174 lbs 10  oz  BMI 31.36 72.0 91.98  Systolic 022 179 810  Diastolic 90 254 75  Pulse 73 93 80    General:  No acute distress,  Alert and oriented, Non-Toxic, Normal speech and affect HEENT: Left parietal scalp cyst approximately 2 cm.  Benign appearing.  Assessment/Plan Left parietal scalp cyst likely sebaceous cyst.  We will plan excision under local.    Brittney Herrera 11/13/2021, 2:25 PM

## 2021-11-17 ENCOUNTER — Telehealth: Payer: Self-pay | Admitting: *Deleted

## 2021-11-17 ENCOUNTER — Telehealth: Payer: Self-pay | Admitting: Gynecologic Oncology

## 2021-11-17 DIAGNOSIS — N322 Vesical fistula, not elsewhere classified: Secondary | ICD-10-CM

## 2021-11-17 DIAGNOSIS — N920 Excessive and frequent menstruation with regular cycle: Secondary | ICD-10-CM

## 2021-11-17 DIAGNOSIS — N9489 Other specified conditions associated with female genital organs and menstrual cycle: Secondary | ICD-10-CM

## 2021-11-17 MED ORDER — NORETHINDRONE 0.35 MG PO TABS: 1.0000 | ORAL_TABLET | Freq: Every day | ORAL | 11 refills | Status: DC

## 2021-11-17 NOTE — Telephone Encounter (Signed)
Spoke to the patient about MRI. Overall, very significant improvement in the size/ appearance of adnexal mass and bladder fistula not visualized on this MRI. Discussed plan to start progesterone to help with bleeding. Patient would like to see general surgery regarding hernia repair - will have office fax referral. Plan for phone visit in late December.  Jeral Pinch MD Gynecologic Oncology

## 2021-11-17 NOTE — Telephone Encounter (Signed)
Patient called and stated "I wanted to know if Dr Berline Lopes got my MRI results back?" Patient also stated "I wanted to let her know that I got my cycles back again and they started this summer. I have had three in a row. They are about a month apart. I am soaking through XL tampons and regular tampons every 2 hrs. The blood is bright red blood with clots. I am having cramping that comes in waves; it feels child birth. Also I have had a lot of bowel movements with blood too."   Explained that Dr Berline Lopes is in the [place] today; the message would be given to both her and Melissa APP. Explained that the office would call her back later today.

## 2021-11-17 NOTE — Telephone Encounter (Signed)
Per Dr Berline Lopes fax records to [facility] for a referral for hernia

## 2021-12-02 DIAGNOSIS — K432 Incisional hernia without obstruction or gangrene: Secondary | ICD-10-CM | POA: Diagnosis not present

## 2021-12-04 ENCOUNTER — Telehealth: Payer: Self-pay | Admitting: *Deleted

## 2021-12-04 NOTE — Telephone Encounter (Signed)
Patient called and stated "I wanted to let Dr Berline Lopes know that I started the BCP on 10/1 and then started the Wednesday 10/4 started bleeding. I am bleeding through a large tampon and overnight pose pad every 11/2 hours. The blood is bright red blood and dark too. The Blood clots are dark only in color. I have some discomfort but on cramping or pain. I have had no procedures or biopsy done. I have no fever, chills or N/V.  Heat does help with the discomfort. I am still taking the pills."  Explained that Dr Berline Lopes is out of the office until Tuesday but the message would be given to her partner Dr Ernestina Patches and the office would call her back.

## 2021-12-09 ENCOUNTER — Encounter: Payer: Self-pay | Admitting: Gynecologic Oncology

## 2021-12-09 ENCOUNTER — Inpatient Hospital Stay: Payer: BC Managed Care – PPO | Attending: Gynecologic Oncology | Admitting: Gynecologic Oncology

## 2021-12-09 DIAGNOSIS — N83209 Unspecified ovarian cyst, unspecified side: Secondary | ICD-10-CM | POA: Diagnosis not present

## 2021-12-09 DIAGNOSIS — Z7189 Other specified counseling: Secondary | ICD-10-CM

## 2021-12-09 DIAGNOSIS — N939 Abnormal uterine and vaginal bleeding, unspecified: Secondary | ICD-10-CM

## 2021-12-09 DIAGNOSIS — Q5128 Other doubling of uterus, other specified: Secondary | ICD-10-CM

## 2021-12-09 NOTE — Progress Notes (Signed)
Gynecologic Oncology Telehealth Note: Gyn-Onc  I connected with Brittney Herrera on 12/09/21 at  4:45 PM EDT by telephone and verified that I am speaking with the correct person using two identifiers.  I discussed the limitations, risks, security and privacy concerns of performing an evaluation and management service by telemedicine and the availability of in-person appointments. I also discussed with the patient that there may be a patient responsible charge related to this service. The patient expressed understanding and agreed to proceed.  Other persons participating in the visit and their role in the encounter: none.  Patient's location: home Provider's location: [facility]  Reason for Visit: follow-up bleeding  Treatment History: Presented initially with complex adnexal mass, abnormal uterine bleeding.   Patient reports presenting recently for gynecologic evaluation secondary to a 2-year history of abnormal menses.  She notes regular menses until last year when her menstrual cycles became longer and heavier.  Last November, she had a long, heavy menses and then has only had a couple of menstrual cycles this year.  She will go months without a cycle.  Her most recent 2 cycles were about a month apart.  Her bleeding is much lighter than it used to be, but requires a pad a little heavier than a panty liner.  She describes it as being thicker with some passage of clots.  She denies any intermenstrual bleeding.  She has some pain and cramping with her menses at baseline, has not been as often since her menstrual volume decreased.   Given menstrual changes, the patient underwent pelvic ultrasound in October that showed a 7.4 cm complex adnexal mass that had increased from a greatest diameter of 4 cm in November of last year.  CA-125 was normal, HE4 and Overa both elevated.   On 01/20/2021, the patient was taken to the operating room for a complex adnexal mass and abnormal uterine bleeding. 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 Due to the concern for possible bowel injury and the unanticipated bowel involvement without a bowel prep, the decision was made to stop the procedure and return after adequate bowel prepping. On 01/22/2021, the patient returned to the operating room after having a bowel prep the day before for the following: Diagnostic laparoscopy, exploratory laparotomy with thorough inspection of small and large bowel (with Dr. Dema Severin), backfill of bladder, cytoscopy with retrograde pyelogram and attempted fistulogram (with Dr. Milford Cage). On 01/23/21, she had a CT scan AP followed by an MRI of the pelvis on 01/24/2021 to further evaluate the pelvis and bladder fistula.   The postoperative course included prophylactic IV cefoxitin and flagyl for 24 hours post-op, foley catheter with no difficulties voiding after removal, superficial abdominal incisional separation with twice daily dressing changes, post-operative ileus requiring NG tube placement on 01/26/2021 with removal the following day in the evening.  She was discharged to home on postoperative day 6 (from 2nd operation).    MRI pelvis on 06/19/21 revealed complex cystic and solid lesion in th left adnexa, slightly decrease in size. 1.8 x 1.6 x 4.3 cm thick walled rim enhancing collection involving posterior bladder wall tracking to the left between posterior bladder wall and anterior uterus toward the left adnexal space.  Overall appearance is similar to previous MRI. Endometrial canal is divergent towards the cornua, likely  related to arcuate anatomy versus septate uterus. Trace free fluid in the pelvis.    MRI pelvis 11/13/21: 1. Ill-defined mixed signal intensity left adnexal mass measures 3.3 x 2.7 x 3.0 cm, decreased from 4.3 x 4.1 x 3.9 cm on 06/11/2021 MRI pelvis. This mass remains densely adherent to the sigmoid colon and left uterine walls. The continued improvement is most suggestive of a largely resolved left tubo-ovarian abscess with residual dense fibrosis. 2. No discrete bladder fistula identified on today's scan. Previously visualized thick-walled 1.8 x 1.6 x 4.3 cm collection along the outer posterior left bladder wall has resolved.  3. Persistent chronic prominent wall thickening throughout the distal sigmoid colon and upper rectum, not substantially changed. 4. 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 5. Simple 2.9 cm right ovarian cyst for which no follow-up imaging is recommended. No suspicious right adnexal masses.   Started on norethindrone for bleeding.   Interval History: Last menses was heavier than normal. Was having significant pain for multiple days. Now has been bleeding for almost 3 weeks, has slowed considerably now.   Past Medical/Surgical History: Past Medical History:  Diagnosis Date   Arthritis    Bicornuate uterus    Hemorrhoids 11/18/2020   History of abnormal cervical Pap smear    Hypertriglyceridemia 11/18/2020   Hypothyroidism    Rheumatoid aortitis    Thyroid disease    Umbilical hernia 76/73/4193    Past Surgical History:  Procedure Laterality Date   ADENOIDECTOMY  02/22/1982   CESAREAN SECTION  04/07/2005   CESAREAN SECTION  09/24/2011   cyst N/A 12/15/2020   2 scalp cysts removed, benign   CYSTOSCOPY W/ RETROGRADES  01/22/2021   Procedure: CYSTOSCOPY WITH RETROGRADE PYELOGRAM;FISTULOGRAM;  Surgeon: Remi Haggard, MD;  Location: [facility];  Service: Urology;;   [place] N/A 01/20/2021   Procedure: Shubert;  Surgeon: Lafonda Mosses, MD;  Location: [facility];  Service: Gynecology;  Laterality: N/A;   DILATION AND CURETTAGE, DIAGNOSTIC / THERAPEUTIC  12/23/2012   FLEXIBLE SIGMOIDOSCOPY N/A 01/22/2021   Procedure: DIAGNOSTIC FLEXIBLE SIGMOIDOSCOPY;  Surgeon: Ileana Roup, MD;  Location: [facility];  Service: General;  Laterality: N/A;   LAPAROTOMY N/A 01/22/2021   Procedure: EXPLORATORY LAPAROTOMY;  Surgeon: Lafonda Mosses, MD;  Location: [facility];  Service: Gynecology;  Laterality: N/A;   LAPAROTOMY N/A 01/22/2021   Procedure: EXPLORATORY LAPAROTOMY;  Surgeon: Ileana Roup, MD;  Location: [facility];  Service: General;  Laterality: N/A;   MYRINGOTOMY WITH TUBE PLACEMENT     TUBAL LIGATION Bilateral 12/23/2012   WISDOM TOOTH EXTRACTION  02/22/1993   XI ROBOTIC ASSISTED SALPINGECTOMY Right 01/20/2021   Procedure: XI ROBOTIC ASSISTED RIGHT  SALPINGECTOMY, LEFT OVARIAN BIOPSY;  Surgeon: Lafonda Mosses, MD;  Location: [facility];  Service: Gynecology;  Laterality: Right;    Family History  Problem Relation Age of Onset   Diabetes Father    Leukemia Father    Heart disease Maternal Grandmother    Heart disease Maternal Grandfather    Heart disease Paternal Grandmother    Skin cancer Other    Colon cancer Neg Hx    Breast cancer Neg Hx    Ovarian cancer Neg Hx    Endometrial cancer Neg Hx    Prostate cancer Neg Hx    Pancreatic cancer Neg Hx  Social History   Socioeconomic History   Marital status: Married    Spouse name: Not on file   Number of children: Not on file   Years of education: Not on file   Highest education level: Not on file  Occupational History   Not on file  Tobacco Use   Smoking status: Former    Packs/day: 0.25    Years: 20.00    Total pack years: 5.00    Types: Cigarettes    Quit date: 2013    Years since quitting: 10.8   Smokeless tobacco: Never  Vaping Use    Vaping Use: Never used  Substance and Sexual Activity   Alcohol use: Yes    Comment: Occasionally   Drug use: Never   Sexual activity: Yes    Birth control/protection: Surgical    Comment: TL  Other Topics Concern   Not on file  Social History Narrative   Not on file   Social Determinants of Health   Financial Resource Strain: Not on file  Food Insecurity: Not on file  Transportation Needs: Not on file  Physical Activity: Not on file  Stress: Not on file  Social Connections: Not on file    Current Medications:  Current Outpatient Medications:    levothyroxine (SYNTHROID) 150 MCG tablet, 1 tablet in the morning on an empty stomach, Disp: , Rfl:    norethindrone (ORTHO MICRONOR) 0.35 MG tablet, Take 1 tablet (0.35 mg total) by mouth daily., Disp: 28 tablet, Rfl: 11  Review of Symptoms: Pertinent positives as per HPI.  Physical Exam: Deferred given limitations of phone visit.  Laboratory & Radiologic Studies: None new  Assessment & Plan: Brittney Herrera is a 46 y.o. 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   She had started having some hot flashes before surgery. Was amenorrheic after surgery, menses restarted several months ago. Started norethindrone in late 10/2021 to help decrease bleeding.  Bleeding has overall significantly decreased since she called the office.  She is continued on the low-dose norethindrone.  We discussed various options including continuing the norethindrone to see if her bleeding normalizes over the next number of weeks.  We also discussed increasing the dose of progesterone (using either 5 mg or 10 mg of Provera) or stopping the progesterone completely.  In the event that her bleeding  continues to be abnormal, either on or off of progesterone, I would recommend that we plan to get an endometrial biopsy prior to her hernia surgery.  I suspect that her current prolonged episode of bleeding is related to the Provera (this started only after starting the norethindrone).  If her bleeding normalizes when she comes off of the Provera (what she would like to do), but I do not think that endometrial biopsy would need to be performed.  In the event that abnormal uterine bleeding continues, we discussed that endometrial biopsy or sampling would be best done in the operating room given her complex history, fistula, and septate uterus.  Doing this in the OR would allow me to have transabdominal ultrasound as well as hysteroscopy available.  We discussed her fistula between the bladder and cervix/uterus.  She has stopped having urinary tract symptoms and the fistula was not visualized on MRI.  We discussed that the fistula may have  spontaneously closed or may have significantly decreased in size contributing to the resolution of her intermittent urinary symptoms.  I have asked the patient to send me a message to let me know how her bleeding changes over the next several weeks and to update me when she has a date scheduled for her hernia repair surgery.  I discussed the assessment and treatment plan with the patient. The patient was provided with an opportunity to ask questions and all were answered. The patient agreed with the plan and demonstrated an understanding of the instructions.   The patient was advised to call back or see an in-person evaluation if the symptoms worsen or if the condition fails to improve as anticipated.   22 minutes of total time was spent for this patient encounter, including preparation, phone counseling with the patient and coordination of care, and documentation of the encounter.   Jeral Pinch, MD  Division of Gynecologic Oncology  Department of Obstetrics  and Gynecology  [facility]

## 2021-12-17 ENCOUNTER — Encounter: Payer: Self-pay | Admitting: Gynecologic Oncology

## 2021-12-17 ENCOUNTER — Ambulatory Visit: Payer: BC Managed Care – PPO | Admitting: Plastic Surgery

## 2021-12-18 ENCOUNTER — Telehealth: Payer: Self-pay | Admitting: Surgery

## 2021-12-18 NOTE — Telephone Encounter (Signed)
Called patient to make sure she was aware of appointment with Dr Georgette Dover from [facility]. Patient aware that this appointment is a consultation and not a guarantee of surgery. Patient verbalized understanding.

## 2021-12-21 ENCOUNTER — Ambulatory Visit (INDEPENDENT_AMBULATORY_CARE_PROVIDER_SITE_OTHER): Payer: BC Managed Care – PPO | Admitting: Plastic Surgery

## 2021-12-21 ENCOUNTER — Encounter: Payer: Self-pay | Admitting: Plastic Surgery

## 2021-12-21 VITALS — BP 122/90 | HR 96

## 2021-12-21 DIAGNOSIS — L7211 Pilar cyst: Secondary | ICD-10-CM

## 2021-12-21 DIAGNOSIS — D489 Neoplasm of uncertain behavior, unspecified: Secondary | ICD-10-CM

## 2021-12-21 NOTE — Progress Notes (Signed)
Operative Note   DATE OF OPERATION: 12/21/2021  LOCATION:    SURGICAL DEPARTMENT: Plastic Surgery  PREOPERATIVE DIAGNOSES:  left scalp cystic  POSTOPERATIVE DIAGNOSES:  same  PROCEDURE:  Excision of left scalp lesion measuring 2.5 cm Intermediate closure measuring 2.5 cm  SURGEON: Bayani Renteria P. Tico Crotteau, MD  ANESTHESIA:  Local  COMPLICATIONS: None.   INDICATIONS FOR PROCEDURE:  The patient, Brittney Herrera is a 46 y.o. female born on 04-28-1975, is here for treatment of left scalp lesion MRN: [id number]  CONSENT:  Informed consent was obtained directly from the patient. Risks, benefits and alternatives were fully discussed. Specific risks including but not limited to bleeding, infection, hematoma, seroma, scarring, pain, infection, wound healing problems, and need for further surgery were all discussed. The patient did have an ample opportunity to have questions answered to satisfaction.   DESCRIPTION OF PROCEDURE:  Local anesthesia was administered. The patient's operative site was prepped and draped in a sterile fashion. A time out was performed and all information was confirmed to be correct.  The lesion was excised with a 15 blade.  Hemostasis was obtained.  Circumferential undermining was performed and the skin was advanced and closed in layers with interrupted buried Vicryl sutures and Prolene for the skin.  The lesion excised measured 2.5 cm, and the total length of closure measured 2.5 cm.  The lesion was sent to pathology.  The patient tolerated the procedure well.  There were no complications.

## 2021-12-28 ENCOUNTER — Ambulatory Visit: Payer: Self-pay | Admitting: Surgery

## 2021-12-28 NOTE — Pre-Procedure Instructions (Signed)
Surgical Instructions    Your procedure is scheduled on January 05, 2022.  Report to [facility] Main Entrance "A" at 7:30 A.M., then check in with the Admitting office.  Call this number if you have problems the morning of surgery:  [phone]   If you have any questions prior to your surgery date call [phone]: Open Monday-Friday 8am-4pm    Remember:  Do not eat after midnight the night before your surgery  You may drink clear liquids until 6:30 AM the morning of your surgery.   Clear liquids allowed are: Water, Non-Citrus Juices (without pulp), Carbonated Beverages, Clear Tea, Black Coffee Only (NO MILK, CREAM OR POWDERED CREAMER of any kind), and Gatorade.     Take these medicines the morning of surgery with A SIP OF WATER:  levothyroxine (SYNTHROID)   acetaminophen (TYLENOL) - may take if needed   As of today, STOP taking any Aspirin (unless otherwise instructed by your surgeon) Aleve, Naproxen, Ibuprofen, Motrin, Advil, Goody's, BC's, all herbal medications, fish oil, and all vitamins.                     Do NOT Smoke (Tobacco/Vaping) for 24 hours prior to your procedure.  If you use a CPAP at night, you may bring your mask/headgear for your overnight stay.   Contacts, glasses, piercing's, hearing aid's, dentures or partials may not be worn into surgery, please bring cases for these belongings.    For patients admitted to the hospital, discharge time will be determined by your treatment team.   Patients discharged the day of surgery will not be allowed to drive home, and someone needs to stay with them for 24 hours.  SURGICAL WAITING ROOM VISITATION Patients having surgery or a procedure may have no more than 2 support people in the waiting area - these visitors may rotate.   Children under the age of 41 must have an adult with them who is not the patient. If the patient needs to stay at the hospital during part of their recovery, the visitor guidelines for  inpatient rooms apply. Pre-op nurse will coordinate an appropriate time for 1 support person to accompany patient in pre-op.  This support person may not rotate.   Please refer to the [facility] website for the visitor guidelines for Inpatients (after your surgery is over and you are in a regular room).    Special instructions:   [place]- Preparing For Surgery  Before surgery, you can play an important role. Because skin is not sterile, your skin needs to be as free of germs as possible. You can reduce the number of germs on your skin by washing with CHG (chlorahexidine gluconate) Soap before surgery.  CHG is an antiseptic cleaner which kills germs and bonds with the skin to continue killing germs even after washing.    Oral Hygiene is also important to reduce your risk of infection.  Remember - BRUSH YOUR TEETH THE MORNING OF SURGERY WITH YOUR REGULAR TOOTHPASTE  Please do not use if you have an allergy to CHG or antibacterial soaps. If your skin becomes reddened/irritated stop using the CHG.  Do not shave (including legs and underarms) for at least 48 hours prior to first CHG shower. It is OK to shave your face.  Please follow these instructions carefully.   Shower the NIGHT BEFORE SURGERY and the MORNING OF SURGERY  If you chose to wash your hair, wash your hair first as usual with your normal shampoo.  After you shampoo, rinse your hair and body thoroughly to remove the shampoo.  Use CHG Soap as you would any other liquid soap. You can apply CHG directly to the skin and wash gently with a scrungie or a clean washcloth.   Apply the CHG Soap to your body ONLY FROM THE NECK DOWN.  Do not use on open wounds or open sores. Avoid contact with your eyes, ears, mouth and genitals (private parts). Wash Face and genitals (private parts)  with your normal soap.   Wash thoroughly, paying special attention to the area where your surgery will be performed.  Thoroughly rinse your body with  warm water from the neck down.  DO NOT shower/wash with your normal soap after using and rinsing off the CHG Soap.  Pat yourself dry with a CLEAN TOWEL.  Wear CLEAN PAJAMAS to bed the night before surgery  Place CLEAN SHEETS on your bed the night before your surgery  DO NOT SLEEP WITH PETS.   Day of Surgery: Take a shower with CHG soap. Do not wear jewelry or makeup Do not wear lotions, powders, perfumes/colognes, or deodorant. Do not shave 48 hours prior to surgery.  Men may shave face and neck. Do not bring valuables to the hospital.  [facility] is not responsible for any belongings or valuables. Do not wear nail polish, gel polish, artificial nails, or any other type of covering on natural nails (fingers and toes) If you have artificial nails or gel coating that need to be removed by a nail salon, please have this removed prior to surgery. Artificial nails or gel coating may interfere with anesthesia's ability to adequately monitor your vital signs.  Wear Clean/Comfortable clothing the morning of surgery Remember to brush your teeth WITH YOUR REGULAR TOOTHPASTE.   Please read over the following fact sheets that you were given.    If you received a COVID test during your pre-op visit  it is requested that you wear a mask when out in public, stay away from anyone that may not be feeling well and notify your surgeon if you develop symptoms. If you have been in contact with anyone that has tested positive in the last 10 days please notify you surgeon.

## 2021-12-29 ENCOUNTER — Encounter (HOSPITAL_COMMUNITY): Payer: Self-pay

## 2021-12-29 ENCOUNTER — Other Ambulatory Visit: Payer: Self-pay

## 2021-12-29 ENCOUNTER — Encounter (HOSPITAL_COMMUNITY)
Admission: RE | Admit: 2021-12-29 | Discharge: 2021-12-29 | Disposition: A | Discharge status: Home / Self Care | Payer: BC Managed Care – PPO | Source: Ambulatory Visit | Attending: Surgery | Admitting: Surgery

## 2021-12-29 VITALS — BP 140/95 | HR 84 | Temp 97.8°F | Resp 16 | Ht 63.0 in | Wt 190.7 lb

## 2021-12-29 DIAGNOSIS — Z01818 Encounter for other preprocedural examination: Secondary | ICD-10-CM

## 2021-12-29 DIAGNOSIS — Z01812 Encounter for preprocedural laboratory examination: Secondary | ICD-10-CM | POA: Insufficient documentation

## 2021-12-29 LAB — CBC
HCT: 41.3 % (ref 36.0–46.0)
Hemoglobin: 14.1 g/dL (ref 12.0–15.0)
MCH: 35.1 pg — ABNORMAL HIGH (ref 26.0–34.0)
MCHC: 34.1 g/dL (ref 30.0–36.0)
MCV: 102.7 fL — ABNORMAL HIGH (ref 80.0–100.0)
Platelets: 357 10*3/uL (ref 150–400)
RBC: 4.02 MIL/uL (ref 3.87–5.11)
RDW: 11.9 % (ref 11.5–15.5)
WBC: 8.3 10*3/uL (ref 4.0–10.5)
nRBC: 0 % (ref 0.0–0.2)

## 2021-12-29 NOTE — Progress Notes (Signed)
PCP - [name], NP Cardiologist - Denies  PPM/ICD - Denies Device Orders - n/a Rep Notified - n/a  Chest x-ray - n/a EKG - Denies Stress Test - Denies ECHO - Denies Cardiac Cath - Denies  Sleep Study - Denies CPAP - n/a  No DM  Last dose of GLP1 agonist- n/a GLP1 instructions: n/a  Blood Thinner Instructions: n/a Aspirin Instructions: n/a  ERAS Protcol - Clear liquids until 0630 morning of surgery PRE-SURGERY Ensure or G2- n/a  COVID TEST- n/a   Anesthesia review: No.   Patient denies shortness of breath, fever, cough and chest pain at PAT appointment   All instructions explained to the patient, with a verbal understanding of the material. Patient agrees to go over the instructions while at home for a better understanding. Patient also instructed to self quarantine after being tested for COVID-19. The opportunity to ask questions was provided.

## 2022-01-04 ENCOUNTER — Encounter: Payer: Self-pay | Admitting: Physician Assistant

## 2022-01-04 ENCOUNTER — Ambulatory Visit (INDEPENDENT_AMBULATORY_CARE_PROVIDER_SITE_OTHER): Payer: BC Managed Care – PPO | Admitting: Physician Assistant

## 2022-01-04 DIAGNOSIS — L7211 Pilar cyst: Secondary | ICD-10-CM

## 2022-01-04 NOTE — Progress Notes (Deleted)
Patient is a 46 year old female with history of a left scalp cyst.  She underwent excision of the left scalp lesion and intermediate closure with Dr. Erin Hearing on 12/21/2021.  Per the procedure note, the lesion was excised.  The incision was closed in layers with interrupted buried Vicryl sutures and Prolene for the skin. Lesion was sent to pathology.  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  Patient presents to the clinic today for postprocedural follow-up.  Today,

## 2022-01-04 NOTE — Progress Notes (Signed)
Patient is a pleasant 46 year old female with PMH of left scalp lesion s/p excision performed 12/21/2021 by Dr. Erin Hearing who presents to clinic for postprocedural follow-up.  Reviewed procedure note and the lesion was excised and then closed with combination of buried Vicryl sutures and Prolene for the skin.  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  Today, patient is doing well.  She states that it is itchy, but otherwise no complaints.  This is not her first scalp cyst excision.  She feels prepared for suture removal.  Running Prolene sutures are removed without complication or difficulty.  The excision site is well approximated, no wounds or dehiscence.  No surrounding erythema.  Well-healed.  Applied a thin film of Vaseline.  Discussed pathology.  All questions answered no specific follow-up needed.  Picture(s) obtained of the patient and placed in the chart were with the patient's or guardian's permission.

## 2022-01-05 ENCOUNTER — Inpatient Hospital Stay (HOSPITAL_COMMUNITY)
Admission: AD | Admit: 2022-01-05 | Discharge: 2022-01-07 | DRG: 336 | Disposition: A | Discharge status: Home / Self Care | Payer: BC Managed Care – PPO | Attending: Surgery | Admitting: Surgery

## 2022-01-05 ENCOUNTER — Other Ambulatory Visit: Payer: Self-pay

## 2022-01-05 ENCOUNTER — Encounter (HOSPITAL_COMMUNITY)
Admission: AD | Disposition: A | Discharge status: Home / Self Care | Payer: Self-pay | Source: Home / Self Care | Attending: Surgery

## 2022-01-05 ENCOUNTER — Ambulatory Visit (HOSPITAL_COMMUNITY): Payer: BC Managed Care – PPO | Admitting: Certified Registered Nurse Anesthetist

## 2022-01-05 ENCOUNTER — Encounter (HOSPITAL_COMMUNITY): Payer: Self-pay | Admitting: Surgery

## 2022-01-05 DIAGNOSIS — E781 Pure hyperglyceridemia: Secondary | ICD-10-CM | POA: Diagnosis not present

## 2022-01-05 DIAGNOSIS — Z8249 Family history of ischemic heart disease and other diseases of the circulatory system: Secondary | ICD-10-CM | POA: Diagnosis not present

## 2022-01-05 DIAGNOSIS — D62 Acute posthemorrhagic anemia: Secondary | ICD-10-CM | POA: Diagnosis not present

## 2022-01-05 DIAGNOSIS — Z888 Allergy status to other drugs, medicaments and biological substances status: Secondary | ICD-10-CM

## 2022-01-05 DIAGNOSIS — Z79890 Hormone replacement therapy (postmenopausal): Secondary | ICD-10-CM

## 2022-01-05 DIAGNOSIS — Z833 Family history of diabetes mellitus: Secondary | ICD-10-CM | POA: Diagnosis not present

## 2022-01-05 DIAGNOSIS — Z87891 Personal history of nicotine dependence: Secondary | ICD-10-CM

## 2022-01-05 DIAGNOSIS — E039 Hypothyroidism, unspecified: Secondary | ICD-10-CM | POA: Diagnosis not present

## 2022-01-05 DIAGNOSIS — Z806 Family history of leukemia: Secondary | ICD-10-CM

## 2022-01-05 DIAGNOSIS — K432 Incisional hernia without obstruction or gangrene: Principal | ICD-10-CM | POA: Diagnosis present

## 2022-01-05 DIAGNOSIS — M0530 Rheumatoid heart disease with rheumatoid arthritis of unspecified site: Secondary | ICD-10-CM | POA: Diagnosis present

## 2022-01-05 DIAGNOSIS — K660 Peritoneal adhesions (postprocedural) (postinfection): Secondary | ICD-10-CM | POA: Diagnosis not present

## 2022-01-05 HISTORY — PX: INSERTION OF MESH: SHX5868

## 2022-01-05 HISTORY — PX: ABDOMINAL ADVANCEMENT FLAP: SHX6151

## 2022-01-05 HISTORY — PX: INCISIONAL HERNIA REPAIR: SHX193

## 2022-01-05 LAB — CBC
HCT: 35 % — ABNORMAL LOW (ref 36.0–46.0)
Hemoglobin: 12.3 g/dL (ref 12.0–15.0)
MCH: 36 pg — ABNORMAL HIGH (ref 26.0–34.0)
MCHC: 35.1 g/dL (ref 30.0–36.0)
MCV: 102.3 fL — ABNORMAL HIGH (ref 80.0–100.0)
Platelets: 388 10*3/uL (ref 150–400)
RBC: 3.42 MIL/uL — ABNORMAL LOW (ref 3.87–5.11)
RDW: 11.6 % (ref 11.5–15.5)
WBC: 21.3 10*3/uL — ABNORMAL HIGH (ref 4.0–10.5)
nRBC: 0 % (ref 0.0–0.2)

## 2022-01-05 LAB — POCT PREGNANCY, URINE: Preg Test, Ur: NEGATIVE

## 2022-01-05 LAB — CREATININE, SERUM
Creatinine, Ser: 0.81 mg/dL (ref 0.44–1.00)
GFR, Estimated: >60 mL/min (ref >60)

## 2022-01-05 SURGERY — REPAIR, HERNIA, INCISIONAL
Anesthesia: General | Site: Abdomen

## 2022-01-05 MED ORDER — CHLORHEXIDINE GLUCONATE CLOTH 2 % EX PADS: 6.0000 | MEDICATED_PAD | Freq: Once | CUTANEOUS | Status: DC

## 2022-01-05 MED ORDER — ENOXAPARIN SODIUM 40 MG/0.4ML IJ SOSY
40.0000 mg | PREFILLED_SYRINGE | Freq: Once | INTRAMUSCULAR | Status: AC
  Administered 2022-01-05: 40 mg via SUBCUTANEOUS
  Filled 2022-01-05: qty 0.4

## 2022-01-05 MED ORDER — FENTANYL CITRATE (PF) 100 MCG/2ML IJ SOLN: 25.0000 ug | INTRAMUSCULAR | Status: DC | PRN

## 2022-01-05 MED ORDER — ONDANSETRON HCL 4 MG/2ML IJ SOLN: 4.0000 mg | Freq: Four times a day (QID) | INTRAMUSCULAR | Status: DC | PRN

## 2022-01-05 MED ORDER — ACETAMINOPHEN 10 MG/ML IV SOLN: 1000.0000 mg | Freq: Once | INTRAVENOUS | Status: DC | PRN

## 2022-01-05 MED ORDER — PROPOFOL 10 MG/ML IV BOLUS
INTRAVENOUS | Status: AC
  Filled 2022-01-05: qty 20

## 2022-01-05 MED ORDER — PROMETHAZINE HCL 25 MG/ML IJ SOLN: 6.2500 mg | INTRAMUSCULAR | Status: DC | PRN

## 2022-01-05 MED ORDER — OXYCODONE HCL 5 MG PO TABS: 5.0000 mg | ORAL_TABLET | Freq: Once | ORAL | Status: DC | PRN

## 2022-01-05 MED ORDER — CHLORHEXIDINE GLUCONATE 0.12 % MT SOLN
15.0000 mL | Freq: Once | OROMUCOSAL | Status: AC
  Administered 2022-01-05: 15 mL via OROMUCOSAL
  Filled 2022-01-05: qty 15

## 2022-01-05 MED ORDER — SCOPOLAMINE 1 MG/3DAYS TD PT72
MEDICATED_PATCH | TRANSDERMAL | Status: AC
  Filled 2022-01-05: qty 1

## 2022-01-05 MED ORDER — BUPIVACAINE LIPOSOME 1.3 % IJ SUSP: 20.0000 mL | Freq: Once | INTRAMUSCULAR | Status: DC

## 2022-01-05 MED ORDER — ACETAMINOPHEN 325 MG PO TABS
650.0000 mg | ORAL_TABLET | Freq: Four times a day (QID) | ORAL | Status: DC
  Administered 2022-01-05 – 2022-01-07 (×7): 650 mg via ORAL
  Filled 2022-01-05 (×8): qty 2

## 2022-01-05 MED ORDER — GABAPENTIN 300 MG PO CAPS
300.0000 mg | ORAL_CAPSULE | ORAL | Status: AC
  Administered 2022-01-05: 300 mg via ORAL
  Filled 2022-01-05: qty 1

## 2022-01-05 MED ORDER — HYDROMORPHONE HCL 1 MG/ML IJ SOLN
1.0000 mg | INTRAMUSCULAR | Status: DC | PRN
  Administered 2022-01-05 – 2022-01-07 (×3): 1 mg via INTRAVENOUS
  Filled 2022-01-05 (×5): qty 1

## 2022-01-05 MED ORDER — BUPIVACAINE LIPOSOME 1.3 % IJ SUSP
INTRAMUSCULAR | Status: DC | PRN
  Administered 2022-01-05: 50 mL

## 2022-01-05 MED ORDER — MIDAZOLAM HCL 2 MG/2ML IJ SOLN
INTRAMUSCULAR | Status: DC | PRN
  Administered 2022-01-05: 2 mg via INTRAVENOUS

## 2022-01-05 MED ORDER — DEXAMETHASONE SODIUM PHOSPHATE 10 MG/ML IJ SOLN
INTRAMUSCULAR | Status: DC | PRN
  Administered 2022-01-05: 4 mg via INTRAVENOUS

## 2022-01-05 MED ORDER — SIMETHICONE 80 MG PO CHEW: 80.0000 mg | CHEWABLE_TABLET | Freq: Four times a day (QID) | ORAL | Status: DC | PRN

## 2022-01-05 MED ORDER — BUPIVACAINE LIPOSOME 1.3 % IJ SUSP
INTRAMUSCULAR | Status: AC
  Filled 2022-01-05: qty 20

## 2022-01-05 MED ORDER — ONDANSETRON HCL 4 MG/2ML IJ SOLN
INTRAMUSCULAR | Status: DC | PRN
  Administered 2022-01-05: 4 mg via INTRAVENOUS

## 2022-01-05 MED ORDER — CEFAZOLIN SODIUM-DEXTROSE 2-4 GM/100ML-% IV SOLN
2.0000 g | INTRAVENOUS | Status: AC
  Administered 2022-01-05: 2 g via INTRAVENOUS
  Filled 2022-01-05: qty 100

## 2022-01-05 MED ORDER — FENTANYL CITRATE (PF) 250 MCG/5ML IJ SOLN
INTRAMUSCULAR | Status: AC
  Filled 2022-01-05: qty 5

## 2022-01-05 MED ORDER — LACTATED RINGERS IV SOLN: INTRAVENOUS | Status: DC

## 2022-01-05 MED ORDER — ACETAMINOPHEN 325 MG PO TABS: 325.0000 mg | ORAL_TABLET | ORAL | Status: DC | PRN

## 2022-01-05 MED ORDER — SCOPOLAMINE 1 MG/3DAYS TD PT72
MEDICATED_PATCH | TRANSDERMAL | Status: DC | PRN
  Administered 2022-01-05: 1 via TRANSDERMAL

## 2022-01-05 MED ORDER — AMISULPRIDE (ANTIEMETIC) 5 MG/2ML IV SOLN: 10.0000 mg | Freq: Once | INTRAVENOUS | Status: DC | PRN

## 2022-01-05 MED ORDER — ROCURONIUM BROMIDE 10 MG/ML (PF) SYRINGE
PREFILLED_SYRINGE | INTRAVENOUS | Status: AC
  Filled 2022-01-05: qty 10

## 2022-01-05 MED ORDER — GABAPENTIN 300 MG PO CAPS
300.0000 mg | ORAL_CAPSULE | Freq: Three times a day (TID) | ORAL | Status: DC
  Administered 2022-01-05 – 2022-01-07 (×7): 300 mg via ORAL
  Filled 2022-01-05 (×6): qty 1

## 2022-01-05 MED ORDER — ENOXAPARIN SODIUM 40 MG/0.4ML IJ SOSY
40.0000 mg | PREFILLED_SYRINGE | INTRAMUSCULAR | Status: DC
  Administered 2022-01-06 – 2022-01-07 (×2): 40 mg via SUBCUTANEOUS
  Filled 2022-01-05 (×2): qty 0.4

## 2022-01-05 MED ORDER — FENTANYL CITRATE (PF) 250 MCG/5ML IJ SOLN
INTRAMUSCULAR | Status: DC | PRN
  Administered 2022-01-05: 75 ug via INTRAVENOUS
  Administered 2022-01-05 (×3): 50 ug via INTRAVENOUS
  Administered 2022-01-05: 75 ug via INTRAVENOUS

## 2022-01-05 MED ORDER — LIDOCAINE 2% (20 MG/ML) 5 ML SYRINGE
INTRAMUSCULAR | Status: DC | PRN
  Administered 2022-01-05: 40 mg via INTRAVENOUS

## 2022-01-05 MED ORDER — ACETAMINOPHEN 160 MG/5ML PO SOLN: 325.0000 mg | ORAL | Status: DC | PRN

## 2022-01-05 MED ORDER — ROCURONIUM BROMIDE 10 MG/ML (PF) SYRINGE
PREFILLED_SYRINGE | INTRAVENOUS | Status: DC | PRN
  Administered 2022-01-05: 5 mg via INTRAVENOUS
  Administered 2022-01-05: 10 mg via INTRAVENOUS
  Administered 2022-01-05: 60 mg via INTRAVENOUS

## 2022-01-05 MED ORDER — KETOROLAC TROMETHAMINE 15 MG/ML IJ SOLN
15.0000 mg | INTRAMUSCULAR | Status: AC
  Administered 2022-01-05: 15 mg via INTRAVENOUS
  Filled 2022-01-05: qty 1

## 2022-01-05 MED ORDER — PHENYLEPHRINE 80 MCG/ML (10ML) SYRINGE FOR IV PUSH (FOR BLOOD PRESSURE SUPPORT)
PREFILLED_SYRINGE | INTRAVENOUS | Status: DC | PRN
  Administered 2022-01-05 (×6): 80 ug via INTRAVENOUS

## 2022-01-05 MED ORDER — MIDAZOLAM HCL 2 MG/2ML IJ SOLN
INTRAMUSCULAR | Status: AC
  Filled 2022-01-05: qty 2

## 2022-01-05 MED ORDER — DEXMEDETOMIDINE HCL IN NACL 80 MCG/20ML IV SOLN
INTRAVENOUS | Status: DC | PRN
  Administered 2022-01-05: 20 ug via BUCCAL

## 2022-01-05 MED ORDER — LEVOTHYROXINE SODIUM 150 MCG PO TABS
150.0000 ug | ORAL_TABLET | Freq: Every day | ORAL | Status: DC
  Administered 2022-01-06 – 2022-01-07 (×2): 150 ug via ORAL
  Filled 2022-01-05 (×2): qty 1

## 2022-01-05 MED ORDER — OXYCODONE HCL 5 MG PO TABS
5.0000 mg | ORAL_TABLET | ORAL | Status: DC | PRN
  Administered 2022-01-06 – 2022-01-07 (×4): 5 mg via ORAL
  Filled 2022-01-05 (×4): qty 1

## 2022-01-05 MED ORDER — PROCHLORPERAZINE EDISYLATE 10 MG/2ML IJ SOLN: 10.0000 mg | INTRAMUSCULAR | Status: DC | PRN

## 2022-01-05 MED ORDER — BUPIVACAINE-EPINEPHRINE (PF) 0.25% -1:200000 IJ SOLN
INTRAMUSCULAR | Status: AC
  Filled 2022-01-05: qty 30

## 2022-01-05 MED ORDER — METHOCARBAMOL 1000 MG/10ML IJ SOLN: 500.0000 mg | Freq: Four times a day (QID) | INTRAVENOUS | Status: DC | PRN

## 2022-01-05 MED ORDER — PROPOFOL 10 MG/ML IV BOLUS
INTRAVENOUS | Status: DC | PRN
  Administered 2022-01-05: 140 mg via INTRAVENOUS

## 2022-01-05 MED ORDER — 0.9 % SODIUM CHLORIDE (POUR BTL) OPTIME
TOPICAL | Status: DC | PRN
  Administered 2022-01-05 (×2): 1000 mL

## 2022-01-05 MED ORDER — KETAMINE HCL 10 MG/ML IJ SOLN
INTRAMUSCULAR | Status: DC | PRN
  Administered 2022-01-05: 40 mg via INTRAVENOUS

## 2022-01-05 MED ORDER — LIDOCAINE 2% (20 MG/ML) 5 ML SYRINGE
INTRAMUSCULAR | Status: AC
  Filled 2022-01-05: qty 5

## 2022-01-05 MED ORDER — DOCUSATE SODIUM 100 MG PO CAPS
100.0000 mg | ORAL_CAPSULE | Freq: Two times a day (BID) | ORAL | Status: DC
  Administered 2022-01-05 – 2022-01-07 (×4): 100 mg via ORAL
  Filled 2022-01-05 (×4): qty 1

## 2022-01-05 MED ORDER — SUGAMMADEX SODIUM 200 MG/2ML IV SOLN
INTRAVENOUS | Status: DC | PRN
  Administered 2022-01-05: 200 mg via INTRAVENOUS

## 2022-01-05 MED ORDER — BUPIVACAINE HCL (PF) 0.25 % IJ SOLN
INTRAMUSCULAR | Status: AC
  Filled 2022-01-05: qty 30

## 2022-01-05 MED ORDER — DEXAMETHASONE SODIUM PHOSPHATE 10 MG/ML IJ SOLN
INTRAMUSCULAR | Status: AC
  Filled 2022-01-05: qty 1

## 2022-01-05 MED ORDER — KETAMINE HCL 50 MG/5ML IJ SOSY
PREFILLED_SYRINGE | INTRAMUSCULAR | Status: AC
  Filled 2022-01-05: qty 5

## 2022-01-05 MED ORDER — OXYCODONE HCL 5 MG PO TABS
10.0000 mg | ORAL_TABLET | ORAL | Status: DC | PRN
  Administered 2022-01-05 – 2022-01-07 (×5): 10 mg via ORAL
  Filled 2022-01-05 (×5): qty 2

## 2022-01-05 MED ORDER — ACETAMINOPHEN 500 MG PO TABS
1000.0000 mg | ORAL_TABLET | ORAL | Status: AC
  Administered 2022-01-05: 1000 mg via ORAL
  Filled 2022-01-05: qty 2

## 2022-01-05 MED ORDER — OXYCODONE HCL 5 MG/5ML PO SOLN: 5.0000 mg | Freq: Once | ORAL | Status: DC | PRN

## 2022-01-05 MED ORDER — ONDANSETRON HCL 4 MG/2ML IJ SOLN
INTRAMUSCULAR | Status: AC
  Filled 2022-01-05: qty 2

## 2022-01-05 MED ORDER — ORAL CARE MOUTH RINSE: 15.0000 mL | Freq: Once | OROMUCOSAL | Status: AC

## 2022-01-05 SURGICAL SUPPLY — 49 items
BAG COUNTER SPONGE SURGICOUNT (BAG) ×3 IMPLANT
BIOPATCH RED 1 DISK 7.0 (GAUZE/BANDAGES/DRESSINGS) IMPLANT
BLADE CLIPPER SURG (BLADE) IMPLANT
CANISTER SUCT 3000ML PPV (MISCELLANEOUS) ×3 IMPLANT
CHLORAPREP W/TINT 26 (MISCELLANEOUS) ×3 IMPLANT
COVER SURGICAL LIGHT HANDLE (MISCELLANEOUS) ×3 IMPLANT
DERMABOND ADVANCED .7 DNX12 (GAUZE/BANDAGES/DRESSINGS) IMPLANT
DRAIN CHANNEL 19F RND (DRAIN) IMPLANT
DRAPE LAPAROSCOPIC ABDOMINAL (DRAPES) ×3 IMPLANT
DRSG TEGADERM 4X4.75 (GAUZE/BANDAGES/DRESSINGS) IMPLANT
ELECT BLADE 4.0 EZ CLEAN MEGAD (MISCELLANEOUS) ×3
ELECT CAUTERY BLADE 6.4 (BLADE) ×3 IMPLANT
ELECT REM PT RETURN 9FT ADLT (ELECTROSURGICAL) ×3
ELECTRODE BLDE 4.0 EZ CLN MEGD (MISCELLANEOUS) IMPLANT
ELECTRODE REM PT RTRN 9FT ADLT (ELECTROSURGICAL) ×3 IMPLANT
EVACUATOR SILICONE 100CC (DRAIN) IMPLANT
GAUZE 4X4 16PLY ~~LOC~~+RFID DBL (SPONGE) IMPLANT
GAUZE SPONGE 4X4 12PLY STRL (GAUZE/BANDAGES/DRESSINGS) IMPLANT
GLOVE BIOGEL M STRL SZ7.5 (GLOVE) ×3 IMPLANT
GLOVE INDICATOR 8.0 STRL GRN (GLOVE) ×6 IMPLANT
GLOVE SRG 8 PF TXTR STRL LF DI (GLOVE) ×3 IMPLANT
GLOVE SURG ENC MOIS LTX SZ7.5 (GLOVE) ×3 IMPLANT
GLOVE SURG UNDER POLY LF SZ8 (GLOVE) ×3
GOWN STRL REUS W/ TWL LRG LVL3 (GOWN DISPOSABLE) ×6 IMPLANT
GOWN STRL REUS W/TWL 2XL LVL3 (GOWN DISPOSABLE) ×3 IMPLANT
GOWN STRL REUS W/TWL LRG LVL3 (GOWN DISPOSABLE) ×6
KIT BASIN OR (CUSTOM PROCEDURE TRAY) ×3 IMPLANT
KIT TURNOVER KIT B (KITS) ×3 IMPLANT
MESH SOFT 12X12IN BARD (Mesh General) IMPLANT
NDL 22X1.5 STRL (OR ONLY) (MISCELLANEOUS) ×3 IMPLANT
NEEDLE 22X1.5 STRL (OR ONLY) (MISCELLANEOUS) ×3 IMPLANT
NS IRRIG 1000ML POUR BTL (IV SOLUTION) ×3 IMPLANT
PACK GENERAL/GYN (CUSTOM PROCEDURE TRAY) ×3 IMPLANT
PAD ARMBOARD 7.5X6 YLW CONV (MISCELLANEOUS) ×3 IMPLANT
PENCIL SMOKE EVACUATOR (MISCELLANEOUS) ×3 IMPLANT
SPONGE T-LAP 18X18 ~~LOC~~+RFID (SPONGE) IMPLANT
STAPLER VISISTAT 35W (STAPLE) IMPLANT
SUT ETHILON 2 0 FS 18 (SUTURE) IMPLANT
SUT ETHILON 2 0 PS N (SUTURE) IMPLANT
SUT MNCRL AB 4-0 PS2 18 (SUTURE) ×3 IMPLANT
SUT PDS AB 2-0 CT2 27 (SUTURE) ×12 IMPLANT
SUT STRATAFIX 1PDS 45CM VIOLET (SUTURE) IMPLANT
SUT VIC AB 3-0 SH 8-18 (SUTURE) ×3 IMPLANT
SYR CONTROL 10ML LL (SYRINGE) ×3 IMPLANT
TOWEL GREEN STERILE (TOWEL DISPOSABLE) ×3 IMPLANT
TOWEL GREEN STERILE FF (TOWEL DISPOSABLE) ×3 IMPLANT
TOWEL OR NON WOVEN STRL DISP B (DISPOSABLE) IMPLANT
TOWEL ~~LOC~~+RFID 17X26 BLUE (SPONGE) IMPLANT
TRAY CATH INTERMITTENT SS 16FR (CATHETERS) IMPLANT

## 2022-01-05 NOTE — Discharge Instructions (Signed)
 VENTRAL HERNIA REPAIR POST OPERATIVE INSTRUCTIONS  Thinking Clearly  The anesthesia may cause you to feel different for 1 or 2 days. Do not drive, drink alcohol, or make any big decisions for at least 2 days.  Nutrition When you wake up, you will be able to drink small amounts of liquid. If you do not feel sick, you can slowly advance your diet to regular foods. Continue to drink lots of fluids, usually about 8 to 10 glasses per day. Eat a high-fiber diet so you don't strain during bowel movements. High-Fiber Foods Foods high in fiber include beans, bran cereals and whole-grain breads, peas, dried fruit (figs, apricots, and dates), raspberries, blackberries, strawberries, sweet corn, broccoli, baked potatoes with skin, plums, pears, apples, greens, and nuts. Activity Slowly increase your activity. Be sure to get up and walk every hour or so to prevent blood clots. No heavy lifting or strenuous activity for 4 weeks following surgery to prevent hernias at your incision sites or recurrence of your hernia. It is normal to feel tired. You may need more sleep than usual.  Get your rest but make sure to get up and move around frequently to prevent blood clots and pneumonia.  Work and Return to School You can go back to work when you feel well enough. Discuss the timing with your surgeon. You can usually go back to school or work 1 week or less after an laparoscopic or an open repair. If your work requires heavy lifting or strenuous activity you need to be placed on light duty for 4 weeks following surgery. You can return to gym class, sports or other physical activities 4 weeks after surgery.  Wound Care You may experience significant bruising throughout the abdominal wall that may track down into the groin including into the scrotum in males.  Rest, elevating the groin and scrotum above the level of the heart, ice and compression with tight fitting underwear or an abdominal binder can help.   Always wash your hands before and after touching near your incision site. Do not soak in a bathtub until cleared at your follow up appointment. You may take a shower 24 hours after surgery. A small amount of drainage from the incision is normal. If the drainage is thick and yellow or the site is red, you may have an infection, so call your surgeon. If you have a drain in one of your incisions, it will be taken out in office when the drainage stops. Steri-Strips will fall off in 7 to 10 days or they will be removed during your first office visit. If you have dermabond glue covering over the incision, allow the glue to flake off on its own. Protect the new skin, especially from the sun. The sun can burn and cause darker scarring. Your scar will heal in about 4 to 6 weeks and will become softer and continue to fade over the next year.  The cosmetic appearance of the incisions will improve over the course of the first year after surgery. Sensation around your incision will return in a few weeks or months.  Bowel Movements After intestinal surgery, you may have loose watery stools for several days. If watery diarrhea lasts longer than 3 days, contact your surgeon. Pain medication (narcotics) can cause constipation. Increase the fiber in your diet with high-fiber foods if you are constipated. You can take an over the counter stool softener like Colace to avoid constipation.  Additional over the counter medications can also be used   if Colace isn't sufficient (for example, Milk of Magnesia or Miralax).  Pain The amount of pain is different for each person. Some people need only 1 to 3 doses of pain control medication, while others need more. Take alternating doses of tylenol and ibuprofen around the clock for the first five days following surgery.  This will provide a baseline of pain control and help with inflammation.  Take the narcotic pain medication in addition if needed for severe pain.  Contact  Your Surgeon at [phone], if you have: Pain that will not go away Pain that gets worse A fever of more than 101F (38.3C) Repeated vomiting Swelling, redness, bleeding, or bad-smelling drainage from your wound site Strong abdominal pain No bowel movement or unable to pass gas for 3 days Watery diarrhea lasting longer than 3 days  Pain Control The goal of pain control is to minimize pain, keep you moving and help you heal. Your surgical team will work with you on your pain plan. Most often a combination of therapies and medications are used to control your pain. You may also be given medication (local anesthetic) at the surgical site. This may help control your pain for several days. Extreme pain puts extra stress on your body at a time when your body needs to focus on healing. Do not wait until your pain has reached a level "10" or is unbearable before telling your doctor or nurse. It is much easier to control pain before it becomes severe. Following a laparoscopic procedure, pain is sometimes felt in the shoulder. This is due to the gas inserted into your abdomen during the procedure. Moving and walking helps to decrease the gas and the right shoulder pain.  Use the guide below for ways to manage your post-operative pain. Learn more by going to facs.org/safepaincontrol.  How Intense Is My Pain Common Therapies to Feel Better       I hardly notice my pain, and it does not interfere with my activities.  I notice my pain and it distracts me, but I can still do activities (sitting up, walking, standing).  Non-Medication Therapies  Ice (in a bag, applied over clothing at the surgical site), elevation, rest, meditation, massage, distraction (music, TV, play) walking and mild exercise Splinting the abdomen with pillows +  Non-Opioid Medications Acetaminophen (Tylenol) Non-steroidal anti-inflammatory drugs (NSAIDS) Aspirin, Ibuprofen (Motrin, Advil) Naproxen (Aleve) Take these as  needed, when you feel pain. Both acetaminophen and NSAIDs help to decrease pain and swelling (inflammation).      My pain is hard to ignore and is more noticeable even when I rest.  My pain interferes with my usual activities.  Non-Medication Therapies  +  Non-Opioid medications  Take on a regular schedule (around-the-clock) instead of as needed. (For example, Tylenol every 6 hours at 9:00 am, 3:00 pm, 9:00 pm, 3:00 am and Motrin every 6 hours at 12:00 am, 6:00 am, 12:00 pm, 6:00 pm)         I am focused on my pain, and I am not doing my daily activities.  I am groaning in pain, and I cannot sleep. I am unable to do anything.  My pain is as bad as it could be, and nothing else matters.  Non-Medication Therapies  +  Around-the-Clock Non-Opioid Medications  +  Short-acting opioids  Opioids should be used with other medications to manage severe pain. Opioids block pain and give a feeling of euphoria (feel high). Addiction, a serious side effect of opioids, is   rare with short-term (a few days) use.  Examples of short-acting opioids include: Tramadol (Ultram), Hydrocodone (Norco, Vicodin), Hydromorphone (Dilaudid), Oxycodone (Oxycontin)     The above directions have been adapted from the American College [employer] Surgical Patient Education Program.  Please refer to the ACS website if needed: [email or web address]://[email or web address]/[email or web address]   Blayden Conwell, MD [facility] [address], [address], [place], [place]  [address] ?  [address] [address], , [place]   [address] [phone] ? [phone] ? FAX [phone] Web site: [email or web address]  

## 2022-01-05 NOTE — Anesthesia Preprocedure Evaluation (Addendum)
Anesthesia Evaluation  Patient identified by MRN, date of birth, ID band Patient awake    Reviewed: Allergy & Precautions, NPO status , Patient's Chart, lab work & pertinent test results  Airway Mallampati: III  TM Distance: >3 FB Neck ROM: Full    Dental  (+) Teeth Intact, Dental Advisory Given   Pulmonary former smoker   breath sounds clear to auscultation       Cardiovascular negative cardio ROS  Rhythm:Regular Rate:Normal     Neuro/Psych negative neurological ROS  negative psych ROS   GI/Hepatic negative GI ROS, Neg liver ROS,,,  Endo/Other  Hypothyroidism    Renal/GU negative Renal ROS     Musculoskeletal  (+) Arthritis ,    Abdominal   Peds  Hematology   Anesthesia Other Findings   Reproductive/Obstetrics                             Anesthesia Physical Anesthesia Plan  ASA: 2  Anesthesia Plan: General   Post-op Pain Management:    Induction: Intravenous  PONV Risk Score and Plan: 4 or greater and Ondansetron, Dexamethasone, Midazolam and Scopolamine patch - Pre-op  Airway Management Planned: Oral ETT  Additional Equipment: None  Intra-op Plan:   Post-operative Plan: Extubation in OR  Informed Consent: I have reviewed the patients History and Physical, chart, labs and discussed the procedure including the risks, benefits and alternatives for the proposed anesthesia with the patient or authorized representative who has indicated his/her understanding and acceptance.     Dental advisory given  Plan Discussed with: CRNA  Anesthesia Plan Comments:        Anesthesia Quick Evaluation

## 2022-01-05 NOTE — Transfer of Care (Signed)
Immediate Anesthesia Transfer of Care Note  Patient: Anaja Monts  Procedure(s) Performed: OPEN INCISIONAL HERNIA REPAIR WITH BARD MESH (Abdomen) INSERTION OF MESH (Abdomen) ABDOMINAL ADVANCEMENT FLAP (Bilateral)  Patient Location: PACU  Anesthesia Type:General  Level of Consciousness: drowsy, patient cooperative, and responds to stimulation  Airway & Oxygen Therapy: Patient Spontanous Breathing and Patient connected to nasal cannula oxygen  Post-op Assessment: Report given to RN and Post -op Vital signs reviewed and stable  Post vital signs: Reviewed and stable  Last Vitals:  Vitals Value Taken Time  BP 113/73 01/05/22 1418  Temp    Pulse 71 01/05/22 1424  Resp 14 01/05/22 1424  SpO2 97 % 01/05/22 1424  Vitals shown include unvalidated device data.  Last Pain:  Vitals:   01/05/22 0757  TempSrc:   PainSc: 0-No pain         Complications:  Encounter Notable Events  Notable Event Outcome Phase Comment  Difficult to intubate - expected  Intraprocedure Filed from anesthesia note documentation.

## 2022-01-05 NOTE — Op Note (Signed)
Patient: Brittney Herrera (Apr 04, 1975, [id number])  Date of Surgery: 01/05/2022   Preoperative Diagnosis: INCISIONAL HERNIA   Postoperative Diagnosis: INCISIONAL HERNIA   Surgical Procedure:  OPEN INCISIONAL HERNIA REPAIR WITH BARD MESH:  POSTERIOR RECTUS SHEATH MYOFASCIAL RELEASE - BILATERAL TRANSVERSUS ABDOMINIS MYOFASCIAL RELEASE - RIGHT SIDED  Operative Team Members:  Surgeon(s) and Role:    * Georgena Weisheit, Nickola Major, MD - Primary   Don Perking, MD - [facility] Resident Assistent   Anesthesiologist: Effie Berkshire, MD; Duane Boston, MD CRNA: Harden Mo, CRNA; Hessie Dibble T, CRNA   Anesthesia: General   Fluids:  Total I/O In: 1600 [I.V.:1600] Out: 750 [Urine:500; WPYKD:983]  Complications: (1) Difficult to intubate - expected  Comments: Filed from anesthesia note documentation.  Drains:   19 Fr. Blake drain in the retromuscular position  Specimen: * No specimens in log *   Disposition:  PACU - hemodynamically stable.  Plan of Care: Admit to inpatient   Indications for Procedure:  Brittney Herrera is an 46 y.o. female with an incisional hernia.   Hernia defect estimated to be about 7 cm tall by 5 cm wide based on exam.  I recommended open incisional hernia repair with mesh. We discussed robotic approach as an option, however she would rather I just excise her previous scar. She is not a smoker, she is not diabetic, her BMI is 32, so she does not have significant risk factors for wound infection, so I feel open approach is reasonable.  I explained the procedure itself as well as its risk, benefits, and alternatives. After full discussion all questions answered patient granted consent to proceed.  We will proceed as scheduled.    Findings:  Hernia Location: Ventral hernia location: Epigastric (M2), Umbilical (M3), and Infraumbilical (M4) Hernia Size:  12 x 6 cm  Mesh Size &Type:  23 cm wide x 29 cm tall Bard Soft Mesh Mesh Position: Sublay -  Retromuscular Myofascial Releases: Bilateral posterior rectus myofascial release and RIGHT sided transversus abdominus myofascial release   Description of Procedure:  The patient was positioned supine on the operating room table, adequately padded and secured.  A timeout procedure was performed.  A midline incision was made and dissection was carried down through subcutaneous tissue. The prior scar was excised, completely. The abdomen was entered safely.  There were dense adhesions between the omentum and the abdominal wall adhesions.  A meticulous and tedious sharp lysis of adhesions was carried out.  This was completed with  no injury to the viscera.  After the anterior abdominal wall was cleared off of all adhesions, a large safety towel was placed over the viscera to protect them.  The hernia defect was located in the Epigastric (M2), Umbilical (M3), and Infraumbilical (M4) region. The total hernia defect area was measured and the size was recorded above under findings.  A rectus myofascial release was performed on the LEFT side. The posterior rectus sheath was incised just lateral to the hernia edge.  This incision in the posterior rectus sheath was extended both cephalad and caudal to the hernia defect.  Dissection was carried out laterally in the retromuscular plane to the edge of the rectus sheath progressively disconnecting the rectus muscle from the underlying posterior rectus sheath. The segmental innervation comprised of intercostal nerve branches 8, 9, 10, 11 and 12 were individually identified and preserved.  The arterial blood supply to the rectus muscle comprised of the superior and inferior epigastric arteries along with the small terminal branches  from the posterior intercostal arteries 10, 11 and 12, the subcostal artery, the posterior lumbar arteries, and the deep circumflex artery were all identified and individually preserved.  The rectus myofascial release accomplished  medialization of the posterior rectus sheath towards the midline and disinsertion of the rectus muscle from its surrounding fascia, and thus its encasement in the rectus sheath, allowing for widening of the rectus muscle and transfer of the rectus flap towards the midline.  This will allow for future inset of the medial aspect of the flap for abdominal wall reconstruction.  A rectus myofascial release was performed on the RIGHT side. The posterior rectus sheath was incised just lateral to the hernia edge.  This incision in the posterior rectus sheath was extended both cephalad and caudal to the hernia defect.  Dissection was carried out laterally in the retromuscular plane to the edge of the rectus sheath progressively disconnecting the rectus muscle from the underlying posterior rectus sheath. The segmental innervation comprised of intercostal nerve branches 8, 9, 10, 11 and 12 were individually identified and preserved.  The arterial blood supply to the rectus muscle comprised of the superior and inferior epigastric arteries along with the small terminal branches from the posterior intercostal arteries 10, 11 and 12, the subcostal artery, the posterior lumbar arteries, and the deep circumflex artery were all identified and individually preserved.  The rectus myofascial release accomplished medialization of the posterior rectus sheath towards the midline and disinsertion of the rectus muscle from its surrounding fascia, and thus its encasement in the rectus sheath, allowing for widening of the rectus muscle and transfer of the rectus flap towards the midline.  This will allow for future inset of the medial aspect of the flap for abdominal wall reconstruction.  A transversus abdominis release (TAR) was performed on the RIGHT side.   The transversus abdominis muscle was identified deep to the posterior rectus sheath and incised vertically along its entire length, entering the pre-peritoneal or pre-transversalis  fascia plane.  This disinserted the transversus abdominis muscle from the linea semilunaris.  Since the intercostal nerves, arteries and veins had been preserved during the rectus myofascial release portion of the procedure, they remained intact during the TAR. The peritoneum was subsequently peeled away from the underside of the divided transversus abdominis muscle.  This dissection was carried out laterally towards the retroperitoneum.  The TAR accomplished additional medialization of the posterior rectus sheath with its attached peritoneum towards the midline to allow for visceral sac closure.  The TAR also provided further offset of tension of the rectus muscle flap with additional transfer of the rectus muscle towards the midline, as it remained attached to the external and internal abdominal oblique muscles.  This will allow for future inset of the medial aspect of the flap for abdominal wall reconstruction.   The towel was removed and the posterior fascia was reapproximated in the midline with a continuous 2-0 vicryl suture. The retromuscular plane was copiously irrigated with warm normal saline. There was good hemostasis of the operative field.   A transversus abdominis plane (TAP) block was performed bilaterally with Exparel.  The anesthetic was injected into the plane between the transversus abdominis and internal abdominal oblique muscle bilaterally.  A new set of sterile gloves were used prior to handling the mesh. The mesh listed above was brought into the operative field and cut to the size specified above.  The mesh was deployed into the retromuscular position where it conformed well to the space.  It  did not require any fixation.  The mesh space was irrigated with saline.  One 17 [place] Blake drain was placed in the retromuscular position. The rectus muscles were re approximated in the midline utilizing a running 1 stratafix suture taking 5 mm bites of the fascia with 5 mm advancement. The  fascia came together well. The subcutaneous tissue was irrigated with saline. Scarpa's fascia was reapproximated with interrupted 2-0 Vicryl suture.  The subcuticular tissue was reapproximated with buried, interrupted 2-0 Vicryl suture.  The skin was closed with a 4-0 Monocryl subcuticular suture and skin glue.  All sponge and needle counts were correct at the end of this case.   Louanna Raw, MD General, Bariatric, & Minimally Invasive Surgery [facility], [place]

## 2022-01-05 NOTE — Anesthesia Procedure Notes (Signed)
Procedure Name: Intubation Date/Time: 01/05/2022 11:12 AM  Performed by: Janace Litten, CRNAPre-anesthesia Checklist: Patient identified, Emergency Drugs available, Suction available and Patient being monitored Patient Re-evaluated:Patient Re-evaluated prior to induction Oxygen Delivery Method: Circle System Utilized Preoxygenation: Pre-oxygenation with 100% oxygen Induction Type: IV induction Ventilation: Mask ventilation without difficulty Laryngoscope Size: Glidescope and 3 Grade View: Grade I Tube type: Oral Tube size: 7.0 mm Number of attempts: 1 Airway Equipment and Method: Stylet and Video-laryngoscopy Placement Confirmation: ETT inserted through vocal cords under direct vision, positive ETCO2 and breath sounds checked- equal and bilateral Secured at: 21 cm Tube secured with: Tape Dental Injury: Teeth and Oropharynx as per pre-operative assessment  Difficulty Due To: Difficulty was anticipated, Difficult Airway- due to anterior larynx, Difficult Airway-  due to edematous airway and Difficult Airway- due to limited oral opening Comments: Elective glidescope d/t previous difficult intubation d/t limited neck extension anterior larynx, and limited oral opening.

## 2022-01-05 NOTE — Anesthesia Postprocedure Evaluation (Signed)
Anesthesia Post Note  Patient: Brittney Herrera  Procedure(s) Performed: OPEN INCISIONAL HERNIA REPAIR WITH BARD MESH (Abdomen) INSERTION OF MESH (Abdomen) ABDOMINAL ADVANCEMENT FLAP (Bilateral)     Patient location during evaluation: PACU Anesthesia Type: General Level of consciousness: sedated Pain management: pain level controlled Vital Signs Assessment: post-procedure vital signs reviewed and stable Respiratory status: spontaneous breathing and respiratory function stable Cardiovascular status: stable Postop Assessment: no apparent nausea or vomiting Anesthetic complications: yes   Encounter Notable Events  Notable Event Outcome Phase Comment  Difficult to intubate - expected  Intraprocedure Filed from anesthesia note documentation.    Last Vitals:  Vitals:   01/05/22 1430 01/05/22 1445  BP: 116/83 109/71  Pulse: 68 68  Resp: 15 17  Temp:    SpO2: 92% 95%    Last Pain:  Vitals:   01/05/22 1551  TempSrc:   PainSc: 0-No pain                 Atilano Covelli DANIEL

## 2022-01-05 NOTE — H&P (Signed)
Admitting Physician: Nickola Major Shaymus Eveleth  Service: General Surgery  CC: Incisional hernia  Subjective   HPI: Brittney Herrera is an 46 y.o. female who is here for incisional hernia repair.  Past Medical History:  Diagnosis Date   Arthritis    Bicornuate uterus    Hemorrhoids 11/18/2020   History of abnormal cervical Pap smear    Hypertriglyceridemia 11/18/2020   Hypothyroidism    Rheumatoid aortitis    Thyroid disease    Umbilical hernia 63/02/6008    Past Surgical History:  Procedure Laterality Date   ADENOIDECTOMY  02/22/1982   CESAREAN SECTION  04/07/2005   CESAREAN SECTION  09/24/2011   cyst N/A 12/15/2020   2 scalp cysts removed, benign   CYSTOSCOPY W/ RETROGRADES  01/22/2021   Procedure: CYSTOSCOPY WITH RETROGRADE PYELOGRAM;FISTULOGRAM;  Surgeon: Remi Haggard, MD;  Location: [facility];  Service: Urology;;   [place] N/A 01/20/2021   Procedure: [place];  Surgeon: Lafonda Mosses, MD;  Location: [facility];  Service: Gynecology;  Laterality: N/A;   DILATION AND CURETTAGE, DIAGNOSTIC / THERAPEUTIC  12/23/2012   FLEXIBLE SIGMOIDOSCOPY N/A 01/22/2021   Procedure: DIAGNOSTIC FLEXIBLE SIGMOIDOSCOPY;  Surgeon: Ileana Roup, MD;  Location: [facility];  Service: General;  Laterality: N/A;   LAPAROTOMY N/A 01/22/2021   Procedure: EXPLORATORY LAPAROTOMY;  Surgeon: Lafonda Mosses, MD;  [facility]: [facility];  Service: Gynecology;  Laterality: N/A;   LAPAROTOMY N/A 01/22/2021   Procedure: EXPLORATORY LAPAROTOMY;  Surgeon: Ileana Roup, MD;  [facility];  Service: General;  Laterality: N/A;   MYRINGOTOMY WITH TUBE PLACEMENT     TUBAL LIGATION Bilateral 12/23/2012   WISDOM TOOTH EXTRACTION  02/22/1993   XI ROBOTIC ASSISTED SALPINGECTOMY Right 01/20/2021   Procedure: XI ROBOTIC ASSISTED RIGHT  SALPINGECTOMY, LEFT OVARIAN BIOPSY;  Surgeon: Lafonda Mosses, MD;  [facility]: [facility];  Service: Gynecology;  Laterality: Right;    Family History  Problem Relation Age of Onset   Diabetes Father    Leukemia Father    Heart disease Maternal Grandmother    Heart disease Maternal Grandfather    Heart disease Paternal Grandmother    Skin cancer Other    Colon cancer Neg Hx    Breast cancer Neg Hx    Ovarian cancer Neg Hx    Endometrial cancer Neg Hx    Prostate cancer Neg Hx    Pancreatic cancer Neg Hx     Social:  reports that she quit smoking about 10 years ago. Her smoking use included cigarettes. She has a 5.00 pack-year smoking history. She has never used smokeless tobacco. She reports current alcohol use of about 6.0 standard drinks of alcohol per week. She reports that she does not use drugs.  Allergies:  Allergies  Allergen Reactions   Prednisone Other (See Comments)    Facial swelling      Medications: Current Outpatient Medications  Medication Instructions   acetaminophen (TYLENOL) 1,000 mg, Oral, Every 8 hours PRN   ibuprofen (ADVIL) 400 mg, Oral, Every 8 hours PRN   levothyroxine (SYNTHROID) 150 mcg, Oral, Daily before breakfast    ROS - all of the below systems have been reviewed with the patient and positives are indicated with bold text General: chills, fever or night sweats Eyes: blurry vision or double vision ENT: epistaxis or sore throat Allergy/Immunology: itchy/watery eyes or nasal congestion Hematologic/Lymphatic: bleeding problems, blood clots or swollen lymph nodes Endocrine: temperature intolerance or unexpected weight  changes Breast: new or changing breast lumps or nipple discharge Resp: cough, shortness of breath, or wheezing CV: chest pain or dyspnea on exertion GI: as per HPI GU: dysuria, trouble voiding, or hematuria MSK: joint pain or joint stiffness Neuro: TIA or stroke symptoms Derm: pruritus and skin lesion changes Psych: anxiety and depression  Objective   PE Blood pressure 138/86, pulse 62, temperature 97.7  F (36.5 C), temperature source Oral, resp. rate 18, height '5\' 3"'$  (1.6 m), weight 84.8 kg, last menstrual period 11/22/2021, SpO2 100 %. Constitutional: NAD; conversant; no deformities Eyes: Moist conjunctiva; no lid lag; anicteric; PERRL Neck: Trachea midline; no thyromegaly Lungs: Normal respiratory effort; no tactile fremitus CV: RRR; no palpable thrills; no pitting edema GI: Abd Incisional hernia; no palpable hepatosplenomegaly MSK: Wide scar with some staple marks. Palpable, partially reducible hernia along the mid aspect of the incision. Unable to palpate hernia defect, but area involved seems to be about 7 cm tall by 5 cm wide.  Psychiatric: Appropriate affect; alert and oriented x3 Lymphatic: No palpable cervical or axillary lymphadenopathy  Results for orders placed or performed during the hospital encounter of 01/05/22 (from the past 24 hour(s))  Pregnancy, urine POC     Status: None   Collection Time: 01/05/22  7:49 AM  Result Value Ref Range   Preg Test, Ur NEGATIVE NEGATIVE    Imaging Orders  No imaging studies ordered today     Assessment and Plan   Brittney Herrera is an 46 y.o. female with an incisional hernia.  Hernia defect estimated to be about 7 cm tall by 5 cm wide based on exam.  I recommended open incisional hernia repair with mesh. We discussed robotic approach as an option, however she would rather I just excise her previous scar. She is not a smoker, she is not diabetic, her BMI is 32, so she does not have significant risk factors for wound infection, so I feel open approach is reasonable.  I explained the procedure itself as well as its risk, benefits, and alternatives. After full discussion all questions answered patient granted consent to proceed.  We will proceed as scheduled.   Felicie Morn, MD  [facility] Use [email or web address] to contact on call provider

## 2022-01-06 ENCOUNTER — Encounter (HOSPITAL_COMMUNITY): Payer: Self-pay | Admitting: Surgery

## 2022-01-06 DIAGNOSIS — Z8249 Family history of ischemic heart disease and other diseases of the circulatory system: Secondary | ICD-10-CM | POA: Diagnosis not present

## 2022-01-06 DIAGNOSIS — Z79890 Hormone replacement therapy (postmenopausal): Secondary | ICD-10-CM | POA: Diagnosis not present

## 2022-01-06 DIAGNOSIS — D62 Acute posthemorrhagic anemia: Secondary | ICD-10-CM | POA: Diagnosis not present

## 2022-01-06 DIAGNOSIS — M0530 Rheumatoid heart disease with rheumatoid arthritis of unspecified site: Secondary | ICD-10-CM | POA: Diagnosis present

## 2022-01-06 DIAGNOSIS — E039 Hypothyroidism, unspecified: Secondary | ICD-10-CM | POA: Diagnosis present

## 2022-01-06 DIAGNOSIS — Z87891 Personal history of nicotine dependence: Secondary | ICD-10-CM | POA: Diagnosis not present

## 2022-01-06 DIAGNOSIS — Z888 Allergy status to other drugs, medicaments and biological substances status: Secondary | ICD-10-CM | POA: Diagnosis not present

## 2022-01-06 DIAGNOSIS — E781 Pure hyperglyceridemia: Secondary | ICD-10-CM | POA: Diagnosis present

## 2022-01-06 DIAGNOSIS — Z806 Family history of leukemia: Secondary | ICD-10-CM | POA: Diagnosis not present

## 2022-01-06 DIAGNOSIS — Z833 Family history of diabetes mellitus: Secondary | ICD-10-CM | POA: Diagnosis not present

## 2022-01-06 DIAGNOSIS — K660 Peritoneal adhesions (postprocedural) (postinfection): Secondary | ICD-10-CM | POA: Diagnosis present

## 2022-01-06 DIAGNOSIS — K432 Incisional hernia without obstruction or gangrene: Secondary | ICD-10-CM | POA: Diagnosis present

## 2022-01-06 LAB — CBC
HCT: 29.3 % — ABNORMAL LOW (ref 36.0–46.0)
HCT: 25.5 % — ABNORMAL LOW (ref 36.0–46.0)
Hemoglobin: 9.5 g/dL — ABNORMAL LOW (ref 12.0–15.0)
Hemoglobin: 8.5 g/dL — ABNORMAL LOW (ref 12.0–15.0)
MCH: 34.7 pg — ABNORMAL HIGH (ref 26.0–34.0)
MCH: 35.1 pg — ABNORMAL HIGH (ref 26.0–34.0)
MCHC: 32.4 g/dL (ref 30.0–36.0)
MCHC: 33.3 g/dL (ref 30.0–36.0)
MCV: 106.9 fL — ABNORMAL HIGH (ref 80.0–100.0)
MCV: 105.4 fL — ABNORMAL HIGH (ref 80.0–100.0)
Platelets: 417 10*3/uL — ABNORMAL HIGH (ref 150–400)
Platelets: 361 10*3/uL (ref 150–400)
RBC: 2.74 MIL/uL — ABNORMAL LOW (ref 3.87–5.11)
RBC: 2.42 MIL/uL — ABNORMAL LOW (ref 3.87–5.11)
RDW: 11.8 % (ref 11.5–15.5)
RDW: 11.8 % (ref 11.5–15.5)
WBC: 15.8 10*3/uL — ABNORMAL HIGH (ref 4.0–10.5)
WBC: 9.9 10*3/uL (ref 4.0–10.5)
nRBC: 0 % (ref 0.0–0.2)
nRBC: 0 % (ref 0.0–0.2)

## 2022-01-06 LAB — BASIC METABOLIC PANEL
Anion gap: 10 (ref 5–15)
BUN: 12 mg/dL (ref 6–20)
CO2: 24 mmol/L (ref 22–32)
Calcium: 8.4 mg/dL — ABNORMAL LOW (ref 8.9–10.3)
Chloride: 98 mmol/L (ref 98–111)
Creatinine, Ser: 1 mg/dL (ref 0.44–1.00)
GFR, Estimated: >60 mL/min (ref >60)
Glucose, Bld: 136 mg/dL — ABNORMAL HIGH (ref 70–99)
Potassium: 4.4 mmol/L (ref 3.5–5.1)
Sodium: 132 mmol/L — ABNORMAL LOW (ref 135–145)

## 2022-01-06 NOTE — Plan of Care (Signed)

## 2022-01-06 NOTE — Evaluation (Signed)
Occupational Therapy Evaluation Patient Details Name: Brittney Herrera MRN: [id number] DOB: 27-Dec-1975 Today's Date: 01/06/2022   History of Present Illness Pt is a 46 y/o female S/P hernia repair on 01/05/22.   Clinical Impression   Pt in bed upon therapy arrival and agreeable to participate in OT evaluation. Pt reports experiencing dizziness yesterday when getting up and out of bed. BP  monitored during session with no change during positional changes and no reports of dizziness during session. Pt was educated on  standard protocol for hernia surgery; reviewed precautions, bed mobility, compensatory strategies for ADL tasks, safety awareness, lifting restrictions, safe exercises. Handout provided. pt verbalized and demonstrated understanding. Functional mobility completed in hallway to assess safety with IV pole management. Pt required only 2 reminders to keep IV slightly in front of her on the left side when walking to decrease amount of strain on abdominals otherwise, pt demonstrates good safety awareness. At this time, pt does not require any follow up OT services as all education was completed during evaluation. More than likely will follow up with OP PT once cleared by MD.      Recommendations for follow up therapy are one component of a multi-disciplinary discharge planning process, led by the attending physician.  Recommendations may be updated based on patient status, additional functional criteria and insurance authorization.   Follow Up Recommendations  No OT follow up (Outpatient PT when cleared by MD.)     Assistance Recommended at Discharge PRN  Patient can return home with the following Assistance with cooking/housework    Functional Status Assessment  Patient has had a recent decline in their functional status and demonstrates the ability to make significant improvements in function in a reasonable and predictable amount of time.  Equipment Recommendations  None  recommended by OT    Recommendations for Other Services Other (comment) (Mobility Specialist)     Precautions / Restrictions Precautions Precautions: Other (comment) Precaution Comments: S/P abdominal surgery. abdominal binder on at all times. Restrictions Weight Bearing Restrictions: No      Mobility Bed Mobility Overal bed mobility: Modified Independent       General bed mobility comments: utilized log roll technique; slow and cautious movement while able to follow surgical precautions Patient Response: Cooperative  Transfers Overall transfer level: Modified independent Equipment used: None   General transfer comment: able to demonstrate safe sit to stand and functional transfer technique while following surgical precautions      Balance Overall balance assessment: Independent       ADL either performed or assessed with clinical judgement   ADL Overall ADL's : Modified independent         Vision Baseline Vision/History: 1 Wears glasses Ability to See in Adequate Light: 0 Adequate Patient Visual Report: No change from baseline Vision Assessment?: No apparent visual deficits            Pertinent Vitals/Pain Pain Assessment Pain Assessment: 0-10 Pain Score: 4  (at rest. may increase to 7/10 when up and during mobility.) Pain Location: abdomen/surgical incision Pain Intervention(s): Monitored during session, Limited activity within patient's tolerance     Hand Dominance Right   Extremity/Trunk Assessment Upper Extremity Assessment Upper Extremity Assessment: Overall WFL for tasks assessed   Lower Extremity Assessment Lower Extremity Assessment: Defer to PT evaluation   Cervical / Trunk Assessment Cervical / Trunk Assessment: Normal   Communication Communication Communication: No difficulties   Cognition Arousal/Alertness: Awake/alert Behavior During Therapy: WFL for tasks assessed/performed Overall Cognitive Status: Within Functional  Limits for  tasks assessed          Exercises Other Exercises Other Exercises: Patient education provided: standard protocol for hernia surgery; reviewed precautions, bed mobility, compensatory strategies for ADL tasks, safety awareness, lifting restrictions, safe exercises. Handout provided. pt verbalized and demonstrated understanding.   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        OT Problem List: Decreased knowledge of precautions      OT Treatment/Interventions:   Eval only   OT Goals(Current goals can be found in the care plan section) Acute Rehab OT Goals Patient Stated Goal: to get stronger  OT Frequency:  1X visit       AM-PAC OT "6 Clicks" Daily Activity     Outcome Measure Help from another person eating meals?: None Help from another person taking care of personal grooming?: None Help from another person toileting, which includes using toliet, bedpan, or urinal?: None Help from another person bathing (including washing, rinsing, drying)?: None Help from another person to put on and taking off regular upper body clothing?: None Help from another person to put on and taking off regular lower body clothing?: None 6 Click Score: 24   End of Session    Activity Tolerance: Patient tolerated treatment well Patient left: in bed;with call bell/phone within reach  OT Visit Diagnosis: Muscle weakness (generalized) (M62.81)                Time: 3557-3220 OT Time Calculation (min): 73 min Charges:  OT General Charges $OT Visit: 1  Visit OT Evaluation $OT Eval Moderate Complexity: 1 Mod  [employer], OTR/L,CBIS  Supplemental OT - [facility]   Tyrease Vandeberg, Clarene Duke 01/06/2022, 10:23 AM

## 2022-01-06 NOTE — Plan of Care (Signed)

## 2022-01-06 NOTE — Progress Notes (Signed)
Progress Note: General Surgery Service   Chief Complaint/Subjective: Some dizziness last night, better this morning.  Pain okay while laying still.  Objective: Vital signs in last 24 hours: Temp:  [97.8 F (36.6 C)-98.3 F (36.8 C)] 97.8 F (36.6 C) (11/15 0504) Pulse Rate:  [68-81] 71 (11/15 0504) Resp:  [10-18] 18 (11/15 0504) BP: (90-116)/(62-83) 93/64 (11/15 0600) SpO2:  [92 %-99 %] 97 % (11/15 0504)    Intake/Output from previous day: 11/14 0701 - 11/15 0700 In: 3028.6 [I.V.:3028.6] Out: 1520 [Urine:1000; Drains:270; Blood:250] Intake/Output this shift: No intake/output data recorded.  GI: Abd JP sanguineous, incision c/d/I w/ glue  Lab Results: CBC  Recent Labs    01/05/22 1536 01/06/22 0320  WBC 21.3* 15.8*  HGB 12.3 9.5*  HCT 35.0* 29.3*  PLT 388 417*   BMET Recent Labs    01/05/22 1536 01/06/22 0320  NA  --  132*  K  --  4.4  CL  --  98  CO2  --  24  GLUCOSE  --  136*  BUN  --  12  CREATININE 0.81 1.00  CALCIUM  --  8.4*   PT/INR No results for input(s): "LABPROT", "INR" in the last 72 hours. ABG No results for input(s): "PHART", "HCO3" in the last 72 hours.  Invalid input(s): "PCO2", "PO2"  Anti-infectives: Anti-infectives (From admission, onward)    Start     Dose/Rate Route Frequency Ordered Stop   01/05/22 0800  ceFAZolin (ANCEF) IVPB 2g/100 mL premix        2 g 200 mL/hr over 30 Minutes Intravenous On call to O.R. 01/05/22 0748 01/05/22 1117       Medications: Scheduled Meds:  acetaminophen  650 mg Oral Q6H   docusate sodium  100 mg Oral BID   enoxaparin (LOVENOX) injection  40 mg Subcutaneous Q24H   gabapentin  300 mg Oral TID   levothyroxine  150 mcg Oral QAC breakfast   Continuous Infusions:  lactated ringers 100 mL/hr at 01/06/22 0235   methocarbamol (ROBAXIN) IV     PRN Meds:.HYDROmorphone (DILAUDID) injection, methocarbamol (ROBAXIN) IV, ondansetron (ZOFRAN) IV, oxyCODONE, oxyCODONE, prochlorperazine,  simethicone  Assessment/Plan: s/p Procedure(s): OPEN INCISIONAL HERNIA REPAIR WITH BARD MESH INSERTION OF MESH ABDOMINAL ADVANCEMENT FLAP 01/05/2022  Acute blood loss anemia - Recheck hgb at noon Pain control Nausea control ADAT     LOS: 1 day    Felicie Morn, MD  [facility] Use [email or web address] to contact on call provider  Daily Billing: [phone] - post op

## 2022-01-06 NOTE — Progress Notes (Signed)
PT Cancellation Note  Patient Details Name: Brittney Herrera MRN: [id number] DOB: Jul 19, 1975   Cancelled Treatment:    Reason Eval/Treat Not Completed: Other (comment);PT screened, no needs identified, will sign off.  Walking with IV pole and with no assist from OT, will be appropriate for mobility to maintain her functionally.  No further PT needs identified.   Ramond Dial 01/06/2022, 1:04 PM  Mee Hives, PT PhD Acute Rehab Dept. Number: [place]

## 2022-01-07 LAB — CBC
HCT: 22.5 % — ABNORMAL LOW (ref 36.0–46.0)
HCT: 23.1 % — ABNORMAL LOW (ref 36.0–46.0)
Hemoglobin: 7.5 g/dL — ABNORMAL LOW (ref 12.0–15.0)
Hemoglobin: 7.8 g/dL — ABNORMAL LOW (ref 12.0–15.0)
MCH: 35.5 pg — ABNORMAL HIGH (ref 26.0–34.0)
MCH: 35.9 pg — ABNORMAL HIGH (ref 26.0–34.0)
MCHC: 33.3 g/dL (ref 30.0–36.0)
MCHC: 33.8 g/dL (ref 30.0–36.0)
MCV: 106.6 fL — ABNORMAL HIGH (ref 80.0–100.0)
MCV: 106.5 fL — ABNORMAL HIGH (ref 80.0–100.0)
Platelets: 328 10*3/uL (ref 150–400)
Platelets: 353 10*3/uL (ref 150–400)
RBC: 2.11 MIL/uL — ABNORMAL LOW (ref 3.87–5.11)
RBC: 2.17 MIL/uL — ABNORMAL LOW (ref 3.87–5.11)
RDW: 11.9 % (ref 11.5–15.5)
RDW: 11.9 % (ref 11.5–15.5)
WBC: 8.1 10*3/uL (ref 4.0–10.5)
WBC: 8.9 10*3/uL (ref 4.0–10.5)
nRBC: 0 % (ref 0.0–0.2)
nRBC: 0 % (ref 0.0–0.2)

## 2022-01-07 LAB — BASIC METABOLIC PANEL
Anion gap: 5 (ref 5–15)
BUN: 10 mg/dL (ref 6–20)
CO2: 26 mmol/L (ref 22–32)
Calcium: 8.1 mg/dL — ABNORMAL LOW (ref 8.9–10.3)
Chloride: 107 mmol/L (ref 98–111)
Creatinine, Ser: 0.77 mg/dL (ref 0.44–1.00)
GFR, Estimated: >60 mL/min (ref >60)
Glucose, Bld: 125 mg/dL — ABNORMAL HIGH (ref 70–99)
Potassium: 3.9 mmol/L (ref 3.5–5.1)
Sodium: 138 mmol/L (ref 135–145)

## 2022-01-07 MED ORDER — GABAPENTIN 300 MG PO CAPS: 300.0000 mg | ORAL_CAPSULE | Freq: Three times a day (TID) | ORAL | 0 refills | Status: DC

## 2022-01-07 MED ORDER — IBUPROFEN 800 MG PO TABS: 800.0000 mg | ORAL_TABLET | Freq: Three times a day (TID) | ORAL | 0 refills | Status: DC | PRN

## 2022-01-07 MED ORDER — METHOCARBAMOL 750 MG PO TABS: 750.0000 mg | ORAL_TABLET | Freq: Four times a day (QID) | ORAL | 0 refills | Status: DC

## 2022-01-07 MED ORDER — OXYCODONE-ACETAMINOPHEN 5-325 MG PO TABS: 1.0000 | ORAL_TABLET | ORAL | 0 refills | Status: DC | PRN

## 2022-01-07 NOTE — Discharge Summary (Signed)
  Patient ID: Brittney Herrera [id number] 46 y.o. 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   Bladder spasm 01/22/2021   Adnexal mass 01/20/2021   Obesity 12/29/2020   Rheumatoid arthritis ([place]) 12/29/2020   Elevated C-reactive protein (CRP) 12/29/2020   Cyst of ovary 12/29/2020   Abnormal uterine bleeding (AUB) 12/29/2020   Hypertriglyceridemia 01/14/2019   Other abnormal Papanicolaou smear of cervix and cervical HPV 08/23/2008   Hypothyroidism 06/02/2005     Discharge Diagnoses:  Patient Active Problem List   Diagnosis Date Noted   Incisional hernia 01/05/2022   Scalp cyst 11/02/2021   Wound dehiscence, surgical 02/02/2021   Ileus, postoperative ([place]) 01/27/2021   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   Bladder spasm 01/22/2021   Adnexal mass 01/20/2021   Obesity 12/29/2020   Rheumatoid arthritis ([place]) 12/29/2020   Elevated C-reactive protein (CRP) 12/29/2020   Cyst of ovary 12/29/2020   Abnormal uterine bleeding (AUB) 12/29/2020   Hypertriglyceridemia 01/14/2019   Other abnormal Papanicolaou smear of cervix and cervical HPV 08/23/2008   Hypothyroidism 06/02/2005    Operations: Procedure(s): OPEN INCISIONAL HERNIA REPAIR WITH BARD MESH INSERTION OF MESH ABDOMINAL ADVANCEMENT FLAP  Admission Condition: good  Discharged Condition: good  Indication for Admission: Incisional hernia  Hospital Course: [name] underwent open incisional hernia repair with mesh.  Her recovery was complicated by acute blood loss anemia.  On 11/16, her vital signs improved, HBG stopped downtrending and she was  discharged.  Consults: None  Significant Diagnostic Studies: None  Treatments: Surgery as above  Disposition: Home  Patient Instructions:  Allergies as of 01/07/2022       Reactions   Prednisone Other (See Comments)   Facial swelling         Medication List     STOP taking these medications    acetaminophen 500 MG tablet Commonly known as: TYLENOL       TAKE these medications    gabapentin 300 MG capsule Commonly known as: NEURONTIN Take 1 capsule (300 mg total) by mouth 3 (three) times daily.   ibuprofen 800 MG tablet Commonly known as: ADVIL Take 1 tablet (800 mg total) by mouth every 8 (eight) hours as needed. What changed:  medication strength how much to take reasons to take this   levothyroxine 150 MCG tablet Commonly known as: SYNTHROID Take 150 mcg by mouth daily before breakfast.   methocarbamol 750 MG tablet Commonly known as: Robaxin-750 Take 1 tablet (750 mg total) by mouth 4 (four) times daily.   oxyCODONE-acetaminophen 5-325 MG tablet Commonly known as: Percocet Take 1 tablet by mouth every 4 (four) hours as needed for severe pain.        Activity: no heavy lifting for 4 weeks Diet: regular diet Wound Care: Keep wound clean and dry  Follow-up:  With Dr. Thermon Leyland in 4 weeks.  Signed: Nickola Major [name] General, Bariatric, & Minimally Invasive Surgery [facility], [place]   01/07/2022, 1:53 PM

## 2022-01-07 NOTE — Progress Notes (Signed)
Diluadid '1mg'$  wasted in [facility] with Rande Brunt RN

## 2022-01-07 NOTE — Progress Notes (Signed)
Wasted dilaudid '1mg'$  in [place] with [employer] RN

## 2022-01-18 DIAGNOSIS — R7301 Impaired fasting glucose: Secondary | ICD-10-CM | POA: Diagnosis not present

## 2022-01-18 DIAGNOSIS — R5383 Other fatigue: Secondary | ICD-10-CM | POA: Diagnosis not present

## 2022-01-18 DIAGNOSIS — R748 Abnormal levels of other serum enzymes: Secondary | ICD-10-CM | POA: Diagnosis not present

## 2022-01-18 DIAGNOSIS — E039 Hypothyroidism, unspecified: Secondary | ICD-10-CM | POA: Diagnosis not present

## 2022-01-20 DIAGNOSIS — E039 Hypothyroidism, unspecified: Secondary | ICD-10-CM | POA: Diagnosis not present

## 2022-01-20 DIAGNOSIS — R748 Abnormal levels of other serum enzymes: Secondary | ICD-10-CM | POA: Diagnosis not present

## 2022-01-20 DIAGNOSIS — Z0000 Encounter for general adult medical examination without abnormal findings: Secondary | ICD-10-CM | POA: Diagnosis not present

## 2022-01-20 DIAGNOSIS — K760 Fatty (change of) liver, not elsewhere classified: Secondary | ICD-10-CM | POA: Diagnosis not present

## 2022-01-20 DIAGNOSIS — R7401 Elevation of levels of liver transaminase levels: Secondary | ICD-10-CM | POA: Diagnosis not present

## 2022-01-20 DIAGNOSIS — D4989 Neoplasm of unspecified behavior of other specified sites: Secondary | ICD-10-CM | POA: Diagnosis not present

## 2022-02-09 ENCOUNTER — Ambulatory Visit (INDEPENDENT_AMBULATORY_CARE_PROVIDER_SITE_OTHER): Payer: BC Managed Care – PPO | Admitting: Surgical

## 2022-02-09 ENCOUNTER — Encounter: Payer: Self-pay | Admitting: Surgical

## 2022-02-09 VITALS — BP 121/77 | HR 79

## 2022-02-09 DIAGNOSIS — L7211 Pilar cyst: Secondary | ICD-10-CM | POA: Diagnosis not present

## 2022-02-09 DIAGNOSIS — D489 Neoplasm of uncertain behavior, unspecified: Secondary | ICD-10-CM

## 2022-02-09 NOTE — Progress Notes (Signed)
   Referring Provider [name], NP [place],  [place] [address]   CC: No chief complaint on file.     Brittney Herrera is an 46 y.o. female.  HPI: Patient is a 46 year old female here for follow-up after excision of left scalp lesion on 12/21/2021.  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  Patient was last seen in the clinic on 01/04/2022, was doing well.  She presents today for concerns of drainage from the left scalp incision site.  She reports that she felt some "tightness" in the area and then subsequently had some yellowish clear/red fluid drain from the area.  She has not noticed any infectious symptoms.  Tenderness is not present today.  She has not noticed any purulence.  She is hopeful the cyst is not coming back.  Review of Systems General: No fevers or chills  Physical Exam    01/07/2022    8:42 AM 01/07/2022    4:59 AM 01/06/2022   11:14 PM  Vitals with BMI  Systolic [phone]  Diastolic 67 66 56  Pulse 83 84 91    General:  No acute distress,  Alert and oriented, Non-Toxic, Normal speech and affect HEENT: Left scalp incision that is approximately 1 cm.  There is no erythema or cellulitic changes noted.  There is some serous fluid expressed with palpation.  There is no purulence noted.  There is minimal tenderness with palpation.  No foul odor noted.  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  She is 7 weeks post procedure.  She was overall doing well until she noticed some drainage from the left scalp incision.  She is not having any infectious symptoms.  She is otherwise feeling well.  Discussed with the patient I do not see any signs of infection on exam.  I did digitally manipulate the area which caused some irritation and redness, however prior to this there is no erythema or concerns for infection.  I discussed with the patient continue to watch the area for concerns of  infection including swelling, increased pain, erythema.  Picture was taken and placed in the patient's chart with patient's permission.  Patient was agreeable, we will plan to see her back as needed.  She is aware to call if her symptoms worsen or she develops new symptoms concerning for infection which we discussed.  She is aware that the cyst could possibly return, will she is fearful of this but understands that it is a risk of cyst excision procedures.  Brittney Herrera 02/09/2022, 1:14 PM

## 2022-06-21 ENCOUNTER — Telehealth: Payer: Self-pay | Admitting: Oncology

## 2022-06-21 NOTE — Telephone Encounter (Signed)
Left a message to schedule a phone visit with Dr. Pricilla Holm.  Requested a return call.

## 2022-06-23 NOTE — Telephone Encounter (Signed)
Left another message.  Requested a return call.

## 2022-06-23 NOTE — Telephone Encounter (Signed)
Brittney Herrera called back and said she is doing OK.  Her cycle has come back and is more regular.  It is sometimes light and sometimes heavy.  She does have a few questions for Dr. Pricilla Holm about monitoring her tumor and would like to set up an appointment.  Transferred her to [place] [facility] to schedule a telephone visit with Dr. Pricilla Holm.

## 2022-06-23 NOTE — Telephone Encounter (Signed)
Patient schedule for a phone visit with Dr Pricilla Holm on 5/17 at 430

## 2022-07-09 ENCOUNTER — Inpatient Hospital Stay: Payer: BC Managed Care – PPO | Attending: Gynecologic Oncology | Admitting: Gynecologic Oncology

## 2022-07-09 ENCOUNTER — Encounter: Payer: Self-pay | Admitting: Gynecologic Oncology

## 2022-07-09 DIAGNOSIS — L659 Nonscarring hair loss, unspecified: Secondary | ICD-10-CM

## 2022-07-09 DIAGNOSIS — Z7189 Other specified counseling: Secondary | ICD-10-CM

## 2022-07-09 DIAGNOSIS — N322 Vesical fistula, not elsewhere classified: Secondary | ICD-10-CM | POA: Diagnosis not present

## 2022-07-09 DIAGNOSIS — N9489 Other specified conditions associated with female genital organs and menstrual cycle: Secondary | ICD-10-CM | POA: Diagnosis not present

## 2022-07-09 NOTE — Progress Notes (Signed)
Gynecologic Oncology Telehealth Note: Gyn-Onc  I connected with Brittney Herrera on 07/09/22 at  4:30 PM EDT by telephone and verified that I am speaking with the correct person using two identifiers.  I discussed the limitations, risks, security and privacy concerns of performing an evaluation and management service by telemedicine and the availability of in-person appointments. I also discussed with the patient that there may be a patient responsible charge related to this service. The patient expressed understanding and agreed to proceed.  Other persons participating in the visit and their role in the encounter: none.  Patient's location: home Provider's location: [facility]  Reason for Visit: follow-up  Treatment History: Presented initially with complex adnexal mass, abnormal uterine bleeding.   Patient reports presenting recently for gynecologic evaluation secondary to a 2-year history of abnormal menses.  She notes regular menses until last year when her menstrual cycles became longer and heavier.  Last November, she had a long, heavy menses and then has only had a couple of menstrual cycles this year.  She will go months without a cycle.  Her most recent 2 cycles were about a month apart.  Her bleeding is much lighter than it used to be, but requires a pad a little heavier than a panty liner.  She describes it as being thicker with some passage of clots.  She denies any intermenstrual bleeding.  She has some pain and cramping with her menses at baseline, has not been as often since her menstrual volume decreased.   Given menstrual changes, the patient underwent pelvic ultrasound in October that showed a 7.4 cm complex adnexal mass that had increased from a greatest diameter of 4 cm in November of last year.  CA-125 was normal, HE4 and Overa both elevated.   On 01/20/2021, the patient was taken to the operating room for a complex adnexal mass and abnormal uterine bleeding. 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 Due to the concern for possible bowel injury and the unanticipated bowel involvement without a bowel prep, the decision was made to stop the procedure and return after adequate bowel prepping. On 01/22/2021, the patient returned to the operating room after having a bowel prep the day before for the following: Diagnostic laparoscopy, exploratory laparotomy with thorough inspection of small and large bowel (with Dr. Cliffton Asters), backfill of bladder, cytoscopy with retrograde pyelogram and attempted fistulogram (with Dr. Benancio Deeds). On 01/23/21, she had a CT scan AP followed by an MRI of the pelvis on 01/24/2021 to further evaluate the pelvis and bladder fistula.   The postoperative course included prophylactic IV cefoxitin and flagyl for 24 hours post-op, foley catheter with no difficulties voiding after removal, superficial abdominal incisional separation with twice daily dressing changes, post-operative ileus requiring NG tube placement on 01/26/2021 with removal the following day in the evening.  She was discharged to home on postoperative day 6 (from 2nd operation).    MRI pelvis on 06/19/21 revealed complex cystic and solid lesion in th left adnexa, slightly decrease in size. 1.8 x 1.6 x 4.3 cm thick walled rim enhancing collection involving posterior bladder wall tracking to the left between posterior bladder wall and anterior uterus toward the left adnexal space. Overall  appearance is similar to previous MRI. Endometrial canal is divergent towards the cornua, likely related to  arcuate anatomy versus septate uterus. Trace free fluid in the pelvis.    MRI pelvis 11/13/21: 1. Ill-defined mixed signal intensity left adnexal mass measures 3.3 x 2.7 x 3.0 cm, decreased from 4.3 x 4.1 x 3.9 cm on 06/11/2021 MRI pelvis. This mass remains densely adherent to the sigmoid colon and left uterine walls. The continued improvement is most suggestive of a largely resolved left tubo-ovarian abscess with residual dense fibrosis. 2. No discrete bladder fistula identified on today's scan. Previously visualized thick-walled 1.8 x 1.6 x 4.3 cm collection along the outer posterior left bladder wall has resolved.  3. Persistent chronic prominent wall thickening throughout the distal sigmoid colon and upper rectum, not substantially changed. 4. 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 5. Simple 2.9 cm right ovarian cyst for which no follow-up imaging is recommended. No suspicious right adnexal masses.    Started on norethindrone for bleeding.   Interval History: Occasional twinges.  No significant abdominal or pelvic pain. Healed well from hernia surgery. Notes occasional sediment in her urine, occasional smell although describes this as not foul. Menstrual cycle is back to normal.  Past Medical/Surgical History: Past Medical History:  Diagnosis Date   Arthritis    Bicornuate uterus    Hemorrhoids 11/18/2020   History of abnormal cervical Pap smear    Hypertriglyceridemia 11/18/2020   Hypothyroidism    Rheumatoid aortitis    Thyroid disease    Umbilical hernia 11/28/2020    Past Surgical History:  Procedure Laterality Date   ABDOMINAL ADVANCEMENT FLAP Bilateral 01/05/2022   Procedure: ABDOMINAL ADVANCEMENT FLAP;  Surgeon: Quentin Ore, MD;  Location: [facility] OR;  Service: General;  Laterality: Bilateral;  Bilateral posterior rectus sheath and right sided transversus abdominis myofascial advancement flaps   ADENOIDECTOMY   02/22/1982   CESAREAN SECTION  04/07/2005   CESAREAN SECTION  09/24/2011   cyst N/A 12/15/2020   2 scalp cysts removed, benign   CYSTOSCOPY W/ RETROGRADES  01/22/2021   Procedure: CYSTOSCOPY WITH RETROGRADE PYELOGRAM;FISTULOGRAM;  Surgeon: Belva Agee, MD;  Location: [facility];  Service: Urology;;   DILATATION & CURETTAGE/HYSTEROSCOPY WITH MYOSURE N/A 01/20/2021   Procedure: DILATATION & CURETTAGE/HYSTEROSCOPY WITH MYOSURE;  Surgeon: Carver Fila, MD;  Location: [facility];  Service: Gynecology;  Laterality: N/A;   DILATION AND CURETTAGE, DIAGNOSTIC / THERAPEUTIC  12/23/2012   FLEXIBLE SIGMOIDOSCOPY N/A 01/22/2021   Procedure: DIAGNOSTIC FLEXIBLE SIGMOIDOSCOPY;  Surgeon: Andria Meuse, MD;  Location: [facility];  Service: General;  Laterality: N/A;   INCISIONAL HERNIA REPAIR N/A 01/05/2022   Procedure: OPEN INCISIONAL HERNIA REPAIR WITH BARD MESH;  Surgeon: Quentin Ore, MD;  Location: [facility] OR;  Service: General;  Laterality: N/A;   INSERTION OF MESH  01/05/2022   Procedure: INSERTION OF MESH;  Surgeon: Quentin Ore, MD;  Location: [facility];  Service: General;;   LAPAROTOMY N/A 01/22/2021   Procedure: EXPLORATORY LAPAROTOMY;  Surgeon: Carver Fila, MD;  Location: [facility];  Service: Gynecology;  Laterality: N/A;   LAPAROTOMY N/A 01/22/2021   Procedure: EXPLORATORY LAPAROTOMY;  Surgeon: Andria Meuse, MD;  Location: [facility];  Service: General;  Laterality: N/A;   MYRINGOTOMY WITH TUBE PLACEMENT     TUBAL LIGATION Bilateral 12/23/2012   WISDOM TOOTH EXTRACTION  02/22/1993   XI ROBOTIC ASSISTED SALPINGECTOMY Right 01/20/2021   Procedure: XI ROBOTIC ASSISTED RIGHT  SALPINGECTOMY, LEFT OVARIAN  BIOPSY;  Surgeon: Carver Fila, MD;  Location: [facility];  Service: Gynecology;  Laterality: Right;    Family History  Problem Relation Age of Onset   Diabetes Father    Leukemia Father    Heart disease Maternal Grandmother    Heart disease Maternal Grandfather     Heart disease Paternal Grandmother    Skin cancer Other    Colon cancer Neg Hx    Breast cancer Neg Hx    Ovarian cancer Neg Hx    Endometrial cancer Neg Hx    Prostate cancer Neg Hx    Pancreatic cancer Neg Hx     Social History   Socioeconomic History   Marital status: Married    Spouse name: Not on file   Number of children: Not on file   Years of education: Not on file   Highest education level: Not on file  Occupational History   Not on file  Tobacco Use   Smoking status: Former    Packs/day: 0.25    Years: 20.00    Additional pack years: 0.00    Total pack years: 5.00    Types: Cigarettes    Quit date: 2013    Years since quitting: 11.3   Smokeless tobacco: Never  Vaping Use   Vaping Use: Never used  Substance and Sexual Activity   Alcohol use: Yes    Alcohol/week: 6.0 standard drinks of alcohol    Types: 6 Standard drinks or equivalent per week   Drug use: Never   Sexual activity: Yes    Birth control/protection: Surgical    Comment: TL  Other Topics Concern   Not on file  Social History Narrative   Not on file   Social Determinants of Health   Financial Resource Strain: Not on file  Food Insecurity: Not on file  Transportation Needs: Not on file  Physical Activity: Not on file  Stress: Not on file  Social Connections: Not on file    Current Medications:  Current Outpatient Medications:    gabapentin (NEURONTIN) 300 MG capsule, Take 1 capsule (300 mg total) by mouth 3 (three) times daily., Disp: 60 capsule, Rfl: 0   ibuprofen (ADVIL) 800 MG tablet, Take 1 tablet (800 mg total) by mouth every 8 (eight) hours as needed., Disp: 30 tablet, Rfl: 0   levothyroxine (SYNTHROID) 150 MCG tablet, Take 150 mcg by mouth daily before breakfast., Disp: , Rfl:    methocarbamol (ROBAXIN-750) 750 MG tablet, Take 1 tablet (750 mg total) by mouth 4 (four) times daily., Disp: 60 tablet, Rfl: 0   oxyCODONE-acetaminophen (PERCOCET) 5-325 MG tablet, Take 1 tablet by mouth  every 4 (four) hours as needed for severe pain., Disp: 20 tablet, Rfl: 0  Review of Symptoms: Pertinent positives as per HPI.  Physical Exam: Deferred given limitations of phone visit.  Laboratory & Radiologic Studies: None new  Assessment & Plan: Brittney Herrera is a 47 y.o. 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   She had started having some hot flashes before surgery. Was amenorrheic after surgery, these restarted and now have become regular again. Repeat imaging in September of last year showed no evidence of fistula between the bladder and uterus.  Decreased size of adnexal mass.  Patient is overall doing well although  has multiple symptoms that she wanted to discuss today.  She has hair loss and some other symptoms that she has previously experienced related to her thyroid.  I have encouraged her to reach out to her primary care provider again about her thyroid levels and possible change of medications.  Also discussed her intermittent urinary symptoms.  I think that even though there is no imaging evidence of a fistula, there likely still exist a small fistula.  Her description of seeing sediment and having an occasional change in odor of her urine I think support this.  Luckily, she is not having urinary tract symptoms any longer.  With regard to follow-up imaging, we discussed that while no follow-up imaging is necessary given decreased size of the adnexal mass on her last imaging, I think it would be reasonable to repeat an MRI sometime in the next 6-9 months to assure continued stability or decrease size of the adnexal mass.  This would also allow [place] to reimage the bladder and uterus.  Of asked her to call sometime in the fall  when she is ready for me to place the order for an MRI.  She like to get this done outside of the [facility] system for cost reasons.  I discussed the assessment and treatment plan with the patient. The patient was provided with an opportunity to ask questions and all were answered. The patient agreed with the plan and demonstrated an understanding of the instructions.   The patient was advised to call back or see an in-person evaluation if the symptoms worsen or if the condition fails to improve as anticipated.   18 minutes of total time was spent for this patient encounter, including preparation, phone counseling with the patient and coordination of care, and documentation of the encounter.   Eugene Garnet, MD  Division of Gynecologic Oncology  Department of Obstetrics and Gynecology  [facility]

## 2022-07-16 DIAGNOSIS — D539 Nutritional anemia, unspecified: Secondary | ICD-10-CM | POA: Diagnosis not present

## 2022-07-16 DIAGNOSIS — R748 Abnormal levels of other serum enzymes: Secondary | ICD-10-CM | POA: Diagnosis not present

## 2022-07-16 DIAGNOSIS — E039 Hypothyroidism, unspecified: Secondary | ICD-10-CM | POA: Diagnosis not present

## 2022-07-16 DIAGNOSIS — Z13220 Encounter for screening for lipoid disorders: Secondary | ICD-10-CM | POA: Diagnosis not present

## 2022-07-23 DIAGNOSIS — E039 Hypothyroidism, unspecified: Secondary | ICD-10-CM | POA: Diagnosis not present

## 2022-07-23 DIAGNOSIS — D539 Nutritional anemia, unspecified: Secondary | ICD-10-CM | POA: Diagnosis not present

## 2022-07-23 DIAGNOSIS — R748 Abnormal levels of other serum enzymes: Secondary | ICD-10-CM | POA: Diagnosis not present

## 2022-07-23 DIAGNOSIS — Z23 Encounter for immunization: Secondary | ICD-10-CM | POA: Diagnosis not present

## 2022-07-23 DIAGNOSIS — D4989 Neoplasm of unspecified behavior of other specified sites: Secondary | ICD-10-CM | POA: Diagnosis not present

## 2022-08-24 ENCOUNTER — Emergency Department (HOSPITAL_COMMUNITY): Payer: BC Managed Care – PPO

## 2022-08-24 ENCOUNTER — Telehealth: Payer: Self-pay | Admitting: *Deleted

## 2022-08-24 ENCOUNTER — Encounter (HOSPITAL_COMMUNITY): Payer: Self-pay | Admitting: Emergency Medicine

## 2022-08-24 ENCOUNTER — Emergency Department (HOSPITAL_COMMUNITY): Payer: BC Managed Care – PPO | Admitting: Certified Registered Nurse Anesthetist

## 2022-08-24 ENCOUNTER — Encounter (HOSPITAL_COMMUNITY): Admission: EM | Disposition: A | Discharge status: Home Health | Payer: Self-pay | Source: Home / Self Care

## 2022-08-24 ENCOUNTER — Inpatient Hospital Stay (HOSPITAL_COMMUNITY)
Admission: EM | Admit: 2022-08-24 | Discharge: 2022-09-08 | DRG: 329 | Disposition: A | Discharge status: Home Health | Payer: BC Managed Care – PPO | Attending: General Surgery | Admitting: General Surgery

## 2022-08-24 ENCOUNTER — Other Ambulatory Visit: Payer: Self-pay

## 2022-08-24 ENCOUNTER — Inpatient Hospital Stay (HOSPITAL_COMMUNITY): Payer: BC Managed Care – PPO

## 2022-08-24 DIAGNOSIS — I10 Essential (primary) hypertension: Secondary | ICD-10-CM | POA: Diagnosis not present

## 2022-08-24 DIAGNOSIS — K650 Generalized (acute) peritonitis: Secondary | ICD-10-CM | POA: Diagnosis not present

## 2022-08-24 DIAGNOSIS — Z6833 Body mass index (BMI) 33.0-33.9, adult: Secondary | ICD-10-CM | POA: Diagnosis not present

## 2022-08-24 DIAGNOSIS — K828 Other specified diseases of gallbladder: Secondary | ICD-10-CM | POA: Diagnosis not present

## 2022-08-24 DIAGNOSIS — K567 Ileus, unspecified: Secondary | ICD-10-CM | POA: Diagnosis not present

## 2022-08-24 DIAGNOSIS — K659 Peritonitis, unspecified: Secondary | ICD-10-CM | POA: Diagnosis not present

## 2022-08-24 DIAGNOSIS — N736 Female pelvic peritoneal adhesions (postinfective): Secondary | ICD-10-CM | POA: Diagnosis present

## 2022-08-24 DIAGNOSIS — R918 Other nonspecific abnormal finding of lung field: Secondary | ICD-10-CM | POA: Diagnosis not present

## 2022-08-24 DIAGNOSIS — K5730 Diverticulosis of large intestine without perforation or abscess without bleeding: Secondary | ICD-10-CM | POA: Diagnosis not present

## 2022-08-24 DIAGNOSIS — N83201 Unspecified ovarian cyst, right side: Secondary | ICD-10-CM | POA: Diagnosis present

## 2022-08-24 DIAGNOSIS — R6521 Severe sepsis with septic shock: Secondary | ICD-10-CM | POA: Diagnosis not present

## 2022-08-24 DIAGNOSIS — Z79899 Other long term (current) drug therapy: Secondary | ICD-10-CM

## 2022-08-24 DIAGNOSIS — R109 Unspecified abdominal pain: Secondary | ICD-10-CM | POA: Diagnosis not present

## 2022-08-24 DIAGNOSIS — A419 Sepsis, unspecified organism: Secondary | ICD-10-CM | POA: Diagnosis not present

## 2022-08-24 DIAGNOSIS — N8003 Adenomyosis of the uterus: Secondary | ICD-10-CM | POA: Diagnosis not present

## 2022-08-24 DIAGNOSIS — N179 Acute kidney failure, unspecified: Secondary | ICD-10-CM | POA: Diagnosis present

## 2022-08-24 DIAGNOSIS — Z8249 Family history of ischemic heart disease and other diseases of the circulatory system: Secondary | ICD-10-CM

## 2022-08-24 DIAGNOSIS — S31109A Unspecified open wound of abdominal wall, unspecified quadrant without penetration into peritoneal cavity, initial encounter: Secondary | ICD-10-CM | POA: Diagnosis not present

## 2022-08-24 DIAGNOSIS — R1909 Other intra-abdominal and pelvic swelling, mass and lump: Secondary | ICD-10-CM | POA: Diagnosis not present

## 2022-08-24 DIAGNOSIS — R079 Chest pain, unspecified: Secondary | ICD-10-CM | POA: Diagnosis not present

## 2022-08-24 DIAGNOSIS — R140 Abdominal distension (gaseous): Secondary | ICD-10-CM | POA: Diagnosis not present

## 2022-08-24 DIAGNOSIS — K388 Other specified diseases of appendix: Secondary | ICD-10-CM | POA: Diagnosis not present

## 2022-08-24 DIAGNOSIS — J95821 Acute postprocedural respiratory failure: Secondary | ICD-10-CM | POA: Diagnosis not present

## 2022-08-24 DIAGNOSIS — E781 Pure hyperglyceridemia: Secondary | ICD-10-CM | POA: Diagnosis present

## 2022-08-24 DIAGNOSIS — Z79890 Hormone replacement therapy (postmenopausal): Secondary | ICD-10-CM | POA: Diagnosis not present

## 2022-08-24 DIAGNOSIS — Z87891 Personal history of nicotine dependence: Secondary | ICD-10-CM

## 2022-08-24 DIAGNOSIS — E861 Hypovolemia: Secondary | ICD-10-CM | POA: Diagnosis present

## 2022-08-24 DIAGNOSIS — R188 Other ascites: Secondary | ICD-10-CM | POA: Diagnosis not present

## 2022-08-24 DIAGNOSIS — Z4682 Encounter for fitting and adjustment of non-vascular catheter: Secondary | ICD-10-CM | POA: Diagnosis not present

## 2022-08-24 DIAGNOSIS — E872 Acidosis, unspecified: Secondary | ICD-10-CM | POA: Diagnosis present

## 2022-08-24 DIAGNOSIS — K631 Perforation of intestine (nontraumatic): Secondary | ICD-10-CM | POA: Diagnosis not present

## 2022-08-24 DIAGNOSIS — M0530 Rheumatoid heart disease with rheumatoid arthritis of unspecified site: Secondary | ICD-10-CM | POA: Diagnosis present

## 2022-08-24 DIAGNOSIS — K668 Other specified disorders of peritoneum: Secondary | ICD-10-CM | POA: Diagnosis present

## 2022-08-24 DIAGNOSIS — R0989 Other specified symptoms and signs involving the circulatory and respiratory systems: Secondary | ICD-10-CM | POA: Diagnosis not present

## 2022-08-24 DIAGNOSIS — F419 Anxiety disorder, unspecified: Secondary | ICD-10-CM | POA: Diagnosis present

## 2022-08-24 DIAGNOSIS — E039 Hypothyroidism, unspecified: Secondary | ICD-10-CM | POA: Diagnosis not present

## 2022-08-24 DIAGNOSIS — K651 Peritoneal abscess: Secondary | ICD-10-CM | POA: Diagnosis present

## 2022-08-24 DIAGNOSIS — E440 Moderate protein-calorie malnutrition: Secondary | ICD-10-CM | POA: Diagnosis present

## 2022-08-24 DIAGNOSIS — T8143XA Infection following a procedure, organ and space surgical site, initial encounter: Secondary | ICD-10-CM | POA: Diagnosis not present

## 2022-08-24 DIAGNOSIS — Z6838 Body mass index (BMI) 38.0-38.9, adult: Secondary | ICD-10-CM

## 2022-08-24 DIAGNOSIS — E871 Hypo-osmolality and hyponatremia: Secondary | ICD-10-CM | POA: Diagnosis not present

## 2022-08-24 DIAGNOSIS — E669 Obesity, unspecified: Secondary | ICD-10-CM | POA: Diagnosis not present

## 2022-08-24 DIAGNOSIS — Y838 Other surgical procedures as the cause of abnormal reaction of the patient, or of later complication, without mention of misadventure at the time of the procedure: Secondary | ICD-10-CM | POA: Diagnosis not present

## 2022-08-24 DIAGNOSIS — R590 Localized enlarged lymph nodes: Secondary | ICD-10-CM | POA: Diagnosis not present

## 2022-08-24 DIAGNOSIS — R1900 Intra-abdominal and pelvic swelling, mass and lump, unspecified site: Secondary | ICD-10-CM | POA: Diagnosis not present

## 2022-08-24 DIAGNOSIS — E876 Hypokalemia: Secondary | ICD-10-CM | POA: Diagnosis not present

## 2022-08-24 DIAGNOSIS — N200 Calculus of kidney: Secondary | ICD-10-CM | POA: Diagnosis not present

## 2022-08-24 DIAGNOSIS — J982 Interstitial emphysema: Secondary | ICD-10-CM | POA: Diagnosis not present

## 2022-08-24 DIAGNOSIS — T8132XA Disruption of internal operation (surgical) wound, not elsewhere classified, initial encounter: Secondary | ICD-10-CM | POA: Diagnosis not present

## 2022-08-24 DIAGNOSIS — K652 Spontaneous bacterial peritonitis: Secondary | ICD-10-CM | POA: Diagnosis not present

## 2022-08-24 DIAGNOSIS — K5792 Diverticulitis of intestine, part unspecified, without perforation or abscess without bleeding: Secondary | ICD-10-CM | POA: Diagnosis not present

## 2022-08-24 DIAGNOSIS — D6489 Other specified anemias: Secondary | ICD-10-CM | POA: Diagnosis present

## 2022-08-24 DIAGNOSIS — R579 Shock, unspecified: Secondary | ICD-10-CM | POA: Diagnosis not present

## 2022-08-24 DIAGNOSIS — T8189XA Other complications of procedures, not elsewhere classified, initial encounter: Secondary | ICD-10-CM | POA: Diagnosis not present

## 2022-08-24 DIAGNOSIS — J9811 Atelectasis: Secondary | ICD-10-CM | POA: Diagnosis not present

## 2022-08-24 DIAGNOSIS — F430 Acute stress reaction: Secondary | ICD-10-CM | POA: Diagnosis not present

## 2022-08-24 DIAGNOSIS — R10817 Generalized abdominal tenderness: Secondary | ICD-10-CM | POA: Diagnosis not present

## 2022-08-24 DIAGNOSIS — M069 Rheumatoid arthritis, unspecified: Secondary | ICD-10-CM | POA: Diagnosis not present

## 2022-08-24 DIAGNOSIS — R531 Weakness: Secondary | ICD-10-CM | POA: Diagnosis not present

## 2022-08-24 DIAGNOSIS — K660 Peritoneal adhesions (postprocedural) (postinfection): Secondary | ICD-10-CM | POA: Diagnosis present

## 2022-08-24 DIAGNOSIS — N281 Cyst of kidney, acquired: Secondary | ICD-10-CM | POA: Diagnosis not present

## 2022-08-24 DIAGNOSIS — N3289 Other specified disorders of bladder: Secondary | ICD-10-CM | POA: Diagnosis not present

## 2022-08-24 DIAGNOSIS — Q5128 Other doubling of uterus, other specified: Secondary | ICD-10-CM

## 2022-08-24 DIAGNOSIS — Z888 Allergy status to other drugs, medicaments and biological substances status: Secondary | ICD-10-CM

## 2022-08-24 DIAGNOSIS — K658 Other peritonitis: Secondary | ICD-10-CM | POA: Diagnosis not present

## 2022-08-24 DIAGNOSIS — J984 Other disorders of lung: Secondary | ICD-10-CM | POA: Diagnosis not present

## 2022-08-24 DIAGNOSIS — Q513 Bicornate uterus: Secondary | ICD-10-CM

## 2022-08-24 DIAGNOSIS — I878 Other specified disorders of veins: Secondary | ICD-10-CM | POA: Diagnosis not present

## 2022-08-24 HISTORY — PX: LAPAROTOMY: SHX154

## 2022-08-24 LAB — URINALYSIS, ROUTINE W REFLEX MICROSCOPIC
Bilirubin Urine: NEGATIVE
Glucose, UA: NEGATIVE mg/dL
Hgb urine dipstick: NEGATIVE
Ketones, ur: 20 mg/dL — AB
Nitrite: NEGATIVE
Protein, ur: 100 mg/dL — AB
Specific Gravity, Urine: 1.028 (ref 1.005–1.030)
pH: 5 (ref 5.0–8.0)

## 2022-08-24 LAB — CBC WITH DIFFERENTIAL/PLATELET
Abs Immature Granulocytes: 0.06 10*3/uL (ref 0.00–0.07)
Basophils Absolute: 0 10*3/uL (ref 0.0–0.1)
Basophils Relative: 0 %
Eosinophils Absolute: 0 10*3/uL (ref 0.0–0.5)
Eosinophils Relative: 0 %
HCT: 44.9 % (ref 36.0–46.0)
Hemoglobin: 15.3 g/dL — ABNORMAL HIGH (ref 12.0–15.0)
Immature Granulocytes: 1 %
Lymphocytes Relative: 5 %
Lymphs Abs: 0.4 10*3/uL — ABNORMAL LOW (ref 0.7–4.0)
MCH: 32.2 pg (ref 26.0–34.0)
MCHC: 34.1 g/dL (ref 30.0–36.0)
MCV: 94.5 fL (ref 80.0–100.0)
Monocytes Absolute: 0.4 10*3/uL (ref 0.1–1.0)
Monocytes Relative: 5 %
Neutro Abs: 7.3 10*3/uL (ref 1.7–7.7)
Neutrophils Relative %: 89 %
Platelets: 300 10*3/uL (ref 150–400)
RBC: 4.75 MIL/uL (ref 3.87–5.11)
RDW: 14.1 % (ref 11.5–15.5)
WBC: 8.2 10*3/uL (ref 4.0–10.5)
nRBC: 0 % (ref 0.0–0.2)

## 2022-08-24 LAB — BLOOD GAS, ARTERIAL
Acid-base deficit: 3.5 mmol/L — ABNORMAL HIGH (ref 0.0–2.0)
Bicarbonate: 22.7 mmol/L (ref 20.0–28.0)
Drawn by: 422461
FIO2: 60 %
MECHVT: 420 mL
O2 Saturation: 100 %
PEEP: 5 cmH2O
Patient temperature: 36.4
RATE: 18 resp/min
pCO2 arterial: 43 mmHg (ref 32–48)
pH, Arterial: 7.33 — ABNORMAL LOW (ref 7.35–7.45)
pO2, Arterial: 131 mmHg — ABNORMAL HIGH (ref 83–108)

## 2022-08-24 LAB — COMPREHENSIVE METABOLIC PANEL
ALT: 31 U/L (ref 0–44)
AST: 31 U/L (ref 15–41)
Albumin: 2.9 g/dL — ABNORMAL LOW (ref 3.5–5.0)
Alkaline Phosphatase: 129 U/L — ABNORMAL HIGH (ref 38–126)
Anion gap: 14 (ref 5–15)
BUN: 20 mg/dL (ref 6–20)
CO2: 22 mmol/L (ref 22–32)
Calcium: 8.8 mg/dL — ABNORMAL LOW (ref 8.9–10.3)
Chloride: 95 mmol/L — ABNORMAL LOW (ref 98–111)
Creatinine, Ser: 1.06 mg/dL — ABNORMAL HIGH (ref 0.44–1.00)
GFR, Estimated: >60 mL/min (ref >60)
Glucose, Bld: 158 mg/dL — ABNORMAL HIGH (ref 70–99)
Potassium: 3.1 mmol/L — ABNORMAL LOW (ref 3.5–5.1)
Sodium: 131 mmol/L — ABNORMAL LOW (ref 135–145)
Total Bilirubin: 2 mg/dL — ABNORMAL HIGH (ref 0.3–1.2)
Total Protein: 7.4 g/dL (ref 6.5–8.1)

## 2022-08-24 LAB — LACTIC ACID, PLASMA
Lactic Acid, Venous: 2.7 mmol/L (ref 0.5–1.9)
Lactic Acid, Venous: 2.3 mmol/L (ref 0.5–1.9)
Lactic Acid, Venous: 5 mmol/L (ref 0.5–1.9)

## 2022-08-24 LAB — BASIC METABOLIC PANEL
Anion gap: 11 (ref 5–15)
BUN: 17 mg/dL (ref 6–20)
CO2: 20 mmol/L — ABNORMAL LOW (ref 22–32)
Calcium: 7.3 mg/dL — ABNORMAL LOW (ref 8.9–10.3)
Chloride: 99 mmol/L (ref 98–111)
Creatinine, Ser: 0.93 mg/dL (ref 0.44–1.00)
GFR, Estimated: >60 mL/min (ref >60)
Glucose, Bld: 159 mg/dL — ABNORMAL HIGH (ref 70–99)
Potassium: 3.7 mmol/L (ref 3.5–5.1)
Sodium: 130 mmol/L — ABNORMAL LOW (ref 135–145)

## 2022-08-24 LAB — MAGNESIUM: Magnesium: 1.3 mg/dL — ABNORMAL LOW (ref 1.7–2.4)

## 2022-08-24 LAB — GLUCOSE, CAPILLARY
Glucose-Capillary: 132 mg/dL — ABNORMAL HIGH (ref 70–99)
Glucose-Capillary: 138 mg/dL — ABNORMAL HIGH (ref 70–99)

## 2022-08-24 LAB — TROPONIN I (HIGH SENSITIVITY): Troponin I (High Sensitivity): 5 ng/L (ref <18)

## 2022-08-24 LAB — LIPASE, BLOOD: Lipase: 15 U/L (ref 11–51)

## 2022-08-24 LAB — PREGNANCY, URINE: Preg Test, Ur: NEGATIVE

## 2022-08-24 SURGERY — LAPAROTOMY, EXPLORATORY
Anesthesia: General

## 2022-08-24 MED ORDER — LACTATED RINGERS IV BOLUS
1000.0000 mL | Freq: Once | INTRAVENOUS | Status: AC
  Administered 2022-08-24: 1000 mL via INTRAVENOUS

## 2022-08-24 MED ORDER — PIPERACILLIN-TAZOBACTAM 3.375 G IVPB
3.3750 g | Freq: Three times a day (TID) | INTRAVENOUS | Status: AC
  Administered 2022-08-24 – 2022-08-31 (×21): 3.375 g via INTRAVENOUS
  Filled 2022-08-24 (×19): qty 50

## 2022-08-24 MED ORDER — VASOPRESSIN 20 UNIT/ML IV SOLN
INTRAVENOUS | Status: DC | PRN
  Administered 2022-08-24: 1 [IU] via INTRAVENOUS

## 2022-08-24 MED ORDER — ALBUMIN HUMAN 5 % IV SOLN
INTRAVENOUS | Status: AC
  Filled 2022-08-24: qty 250

## 2022-08-24 MED ORDER — BUPIVACAINE LIPOSOME 1.3 % IJ SUSP
INTRAMUSCULAR | Status: AC
  Filled 2022-08-24: qty 20

## 2022-08-24 MED ORDER — PROPOFOL 10 MG/ML IV BOLUS
INTRAVENOUS | Status: AC
  Filled 2022-08-24: qty 20

## 2022-08-24 MED ORDER — SCOPOLAMINE 1 MG/3DAYS TD PT72
MEDICATED_PATCH | TRANSDERMAL | Status: DC | PRN
  Administered 2022-08-24: 1 via TRANSDERMAL

## 2022-08-24 MED ORDER — ONDANSETRON HCL 4 MG/2ML IJ SOLN
INTRAMUSCULAR | Status: DC | PRN
  Administered 2022-08-24: 4 mg via INTRAVENOUS

## 2022-08-24 MED ORDER — PHENYLEPHRINE HCL-NACL 20-0.9 MG/250ML-% IV SOLN
25.0000 ug/min | INTRAVENOUS | Status: DC
  Administered 2022-08-24: 65 ug/min via INTRAVENOUS
  Administered 2022-08-24: 45 ug/min via INTRAVENOUS
  Administered 2022-08-25: 115 ug/min via INTRAVENOUS
  Administered 2022-08-25: 55 ug/min via INTRAVENOUS
  Filled 2022-08-24 (×3): qty 250

## 2022-08-24 MED ORDER — DOCUSATE SODIUM 50 MG/5ML PO LIQD: 100.0000 mg | Freq: Two times a day (BID) | ORAL | Status: DC

## 2022-08-24 MED ORDER — MIDAZOLAM HCL 5 MG/5ML IJ SOLN
INTRAMUSCULAR | Status: DC | PRN
  Administered 2022-08-24: 2 mg via INTRAVENOUS

## 2022-08-24 MED ORDER — DIPHENHYDRAMINE HCL 50 MG/ML IJ SOLN
25.0000 mg | Freq: Four times a day (QID) | INTRAMUSCULAR | Status: DC | PRN
  Administered 2022-08-31 – 2022-09-02 (×2): 25 mg via INTRAVENOUS
  Filled 2022-08-24 (×2): qty 1

## 2022-08-24 MED ORDER — PROPOFOL 1000 MG/100ML IV EMUL
0.0000 ug/kg/min | INTRAVENOUS | Status: DC
  Administered 2022-08-24: 50 ug/kg/min via INTRAVENOUS
  Administered 2022-08-25: 40 ug/kg/min via INTRAVENOUS
  Administered 2022-08-25 – 2022-08-26 (×7): 50 ug/kg/min via INTRAVENOUS
  Administered 2022-08-26: 40 ug/kg/min via INTRAVENOUS
  Administered 2022-08-26 (×2): 50 ug/kg/min via INTRAVENOUS
  Administered 2022-08-26: 100 ug/kg/min via INTRAVENOUS
  Administered 2022-08-26: 50 ug/kg/min via INTRAVENOUS
  Administered 2022-08-27: 40 ug/kg/min via INTRAVENOUS
  Administered 2022-08-27 (×2): 50 ug/kg/min via INTRAVENOUS
  Filled 2022-08-24 (×15): qty 100

## 2022-08-24 MED ORDER — FENTANYL CITRATE PF 50 MCG/ML IJ SOSY
50.0000 ug | PREFILLED_SYRINGE | Freq: Once | INTRAMUSCULAR | Status: AC
  Administered 2022-08-24: 50 ug via INTRAVENOUS
  Filled 2022-08-24: qty 1

## 2022-08-24 MED ORDER — DEXAMETHASONE SODIUM PHOSPHATE 10 MG/ML IJ SOLN
INTRAMUSCULAR | Status: DC | PRN
  Administered 2022-08-24: 10 mg via INTRAVENOUS

## 2022-08-24 MED ORDER — PIPERACILLIN-TAZOBACTAM 3.375 G IVPB 30 MIN
3.3750 g | Freq: Once | INTRAVENOUS | Status: AC
  Administered 2022-08-24: 3.375 g via INTRAVENOUS
  Filled 2022-08-24: qty 50

## 2022-08-24 MED ORDER — ORAL CARE MOUTH RINSE: 15.0000 mL | OROMUCOSAL | Status: DC | PRN

## 2022-08-24 MED ORDER — ACETAMINOPHEN 10 MG/ML IV SOLN: 1000.0000 mg | Freq: Once | INTRAVENOUS | Status: DC | PRN

## 2022-08-24 MED ORDER — LACTATED RINGERS IV SOLN: INTRAVENOUS | Status: DC | PRN

## 2022-08-24 MED ORDER — 0.9 % SODIUM CHLORIDE (POUR BTL) OPTIME
TOPICAL | Status: DC | PRN
  Administered 2022-08-24: 2000 mL

## 2022-08-24 MED ORDER — PANTOPRAZOLE SODIUM 40 MG IV SOLR
40.0000 mg | INTRAVENOUS | Status: DC
  Administered 2022-08-24 – 2022-09-06 (×14): 40 mg via INTRAVENOUS
  Filled 2022-08-24 (×14): qty 10

## 2022-08-24 MED ORDER — NOREPINEPHRINE 4 MG/250ML-% IV SOLN
2.0000 ug/min | INTRAVENOUS | Status: DC
  Administered 2022-08-24: 2 ug/min via INTRAVENOUS
  Filled 2022-08-24: qty 250

## 2022-08-24 MED ORDER — LACTATED RINGERS IV SOLN: INTRAVENOUS | Status: AC

## 2022-08-24 MED ORDER — EPHEDRINE SULFATE-NACL 50-0.9 MG/10ML-% IV SOSY
PREFILLED_SYRINGE | INTRAVENOUS | Status: DC | PRN
  Administered 2022-08-24 (×2): 10 mg via INTRAVENOUS

## 2022-08-24 MED ORDER — MORPHINE SULFATE (PF) 4 MG/ML IV SOLN
4.0000 mg | Freq: Once | INTRAVENOUS | Status: AC
  Administered 2022-08-24: 4 mg via INTRAVENOUS
  Filled 2022-08-24: qty 1

## 2022-08-24 MED ORDER — MORPHINE SULFATE (PF) 2 MG/ML IV SOLN
1.0000 mg | INTRAVENOUS | Status: DC | PRN
  Administered 2022-08-24: 2 mg via INTRAVENOUS
  Filled 2022-08-24: qty 1

## 2022-08-24 MED ORDER — AMISULPRIDE (ANTIEMETIC) 5 MG/2ML IV SOLN: 10.0000 mg | Freq: Once | INTRAVENOUS | Status: DC | PRN

## 2022-08-24 MED ORDER — ONDANSETRON 4 MG PO TBDP: 4.0000 mg | ORAL_TABLET | Freq: Four times a day (QID) | ORAL | Status: DC | PRN

## 2022-08-24 MED ORDER — PHENYLEPHRINE HCL-NACL 20-0.9 MG/250ML-% IV SOLN
INTRAVENOUS | Status: DC | PRN
  Administered 2022-08-24: 50 ug/min via INTRAVENOUS

## 2022-08-24 MED ORDER — PROPOFOL 10 MG/ML IV BOLUS
INTRAVENOUS | Status: DC | PRN
  Administered 2022-08-24: 100 mg via INTRAVENOUS

## 2022-08-24 MED ORDER — SODIUM CHLORIDE 0.9 % IV SOLN: 250.0000 mL | INTRAVENOUS | Status: DC

## 2022-08-24 MED ORDER — ONDANSETRON HCL 4 MG/2ML IJ SOLN
4.0000 mg | Freq: Four times a day (QID) | INTRAMUSCULAR | Status: DC | PRN
  Administered 2022-08-31 – 2022-09-01 (×3): 4 mg via INTRAVENOUS
  Filled 2022-08-24 (×3): qty 2

## 2022-08-24 MED ORDER — IOHEXOL 350 MG/ML SOLN
100.0000 mL | Freq: Once | INTRAVENOUS | Status: AC | PRN
  Administered 2022-08-24: 100 mL via INTRAVENOUS

## 2022-08-24 MED ORDER — SUCCINYLCHOLINE CHLORIDE 200 MG/10ML IV SOSY
PREFILLED_SYRINGE | INTRAVENOUS | Status: DC | PRN
  Administered 2022-08-24: 100 mg via INTRAVENOUS

## 2022-08-24 MED ORDER — FENTANYL CITRATE (PF) 250 MCG/5ML IJ SOLN
INTRAMUSCULAR | Status: DC | PRN
  Administered 2022-08-24 (×2): 100 ug via INTRAVENOUS

## 2022-08-24 MED ORDER — PHENYLEPHRINE 80 MCG/ML (10ML) SYRINGE FOR IV PUSH (FOR BLOOD PRESSURE SUPPORT)
PREFILLED_SYRINGE | INTRAVENOUS | Status: DC | PRN
  Administered 2022-08-24: 240 ug via INTRAVENOUS
  Administered 2022-08-24 (×4): 160 ug via INTRAVENOUS

## 2022-08-24 MED ORDER — FENTANYL BOLUS VIA INFUSION
50.0000 ug | INTRAVENOUS | Status: DC | PRN
  Administered 2022-08-25: 50 ug via INTRAVENOUS
  Administered 2022-08-25: 100 ug via INTRAVENOUS
  Administered 2022-08-25: 75 ug via INTRAVENOUS
  Administered 2022-08-25 (×2): 100 ug via INTRAVENOUS
  Administered 2022-08-26: 50 ug via INTRAVENOUS
  Administered 2022-08-27 (×2): 100 ug via INTRAVENOUS

## 2022-08-24 MED ORDER — LIDOCAINE 2% (20 MG/ML) 5 ML SYRINGE
INTRAMUSCULAR | Status: DC | PRN
  Administered 2022-08-24: 60 mg via INTRAVENOUS

## 2022-08-24 MED ORDER — LACTATED RINGERS IV SOLN: INTRAVENOUS | Status: DC

## 2022-08-24 MED ORDER — CHLORHEXIDINE GLUCONATE CLOTH 2 % EX PADS
6.0000 | MEDICATED_PAD | Freq: Every day | CUTANEOUS | Status: DC
  Administered 2022-08-25 – 2022-09-08 (×12): 6 via TOPICAL

## 2022-08-24 MED ORDER — ORAL CARE MOUTH RINSE
15.0000 mL | OROMUCOSAL | Status: DC
  Administered 2022-08-24 – 2022-08-27 (×34): 15 mL via OROMUCOSAL

## 2022-08-24 MED ORDER — ALBUMIN HUMAN 5 % IV SOLN: INTRAVENOUS | Status: DC | PRN

## 2022-08-24 MED ORDER — PROPOFOL 500 MG/50ML IV EMUL
INTRAVENOUS | Status: DC | PRN
  Administered 2022-08-24: 75 ug/kg/min via INTRAVENOUS

## 2022-08-24 MED ORDER — ROCURONIUM BROMIDE 10 MG/ML (PF) SYRINGE
PREFILLED_SYRINGE | INTRAVENOUS | Status: DC | PRN
  Administered 2022-08-24: 50 mg via INTRAVENOUS
  Administered 2022-08-24: 10 mg via INTRAVENOUS

## 2022-08-24 MED ORDER — PROPOFOL 1000 MG/100ML IV EMUL
INTRAVENOUS | Status: AC
  Filled 2022-08-24: qty 100

## 2022-08-24 MED ORDER — METHOCARBAMOL 1000 MG/10ML IJ SOLN
1000.0000 mg | Freq: Three times a day (TID) | INTRAVENOUS | Status: DC
  Administered 2022-08-24: 1000 mg via INTRAVENOUS
  Filled 2022-08-24: qty 10

## 2022-08-24 MED ORDER — OXYCODONE HCL 5 MG/5ML PO SOLN
5.0000 mg | Freq: Once | ORAL | Status: DC | PRN
Start: 2022-08-24 — End: 2022-08-24

## 2022-08-24 MED ORDER — SODIUM CHLORIDE 0.9 % IV SOLN: INTRAVENOUS | Status: DC | PRN

## 2022-08-24 MED ORDER — MIDAZOLAM HCL 2 MG/2ML IJ SOLN
INTRAMUSCULAR | Status: AC
  Filled 2022-08-24: qty 2

## 2022-08-24 MED ORDER — SCOPOLAMINE 1 MG/3DAYS TD PT72
MEDICATED_PATCH | TRANSDERMAL | Status: AC
  Filled 2022-08-24: qty 1

## 2022-08-24 MED ORDER — ENOXAPARIN SODIUM 40 MG/0.4ML IJ SOSY
40.0000 mg | PREFILLED_SYRINGE | INTRAMUSCULAR | Status: DC
  Administered 2022-08-25 – 2022-09-02 (×9): 40 mg via SUBCUTANEOUS
  Filled 2022-08-24 (×9): qty 0.4

## 2022-08-24 MED ORDER — ACETAMINOPHEN 10 MG/ML IV SOLN
1000.0000 mg | Freq: Four times a day (QID) | INTRAVENOUS | Status: AC
  Administered 2022-08-24 – 2022-08-25 (×4): 1000 mg via INTRAVENOUS
  Filled 2022-08-24 (×4): qty 100

## 2022-08-24 MED ORDER — PROMETHAZINE HCL 25 MG/ML IJ SOLN: 6.2500 mg | INTRAMUSCULAR | Status: DC | PRN

## 2022-08-24 MED ORDER — FENTANYL CITRATE PF 50 MCG/ML IJ SOSY: 50.0000 ug | PREFILLED_SYRINGE | INTRAMUSCULAR | Status: DC | PRN

## 2022-08-24 MED ORDER — KCL IN DEXTROSE-NACL 20-5-0.45 MEQ/L-%-% IV SOLN
INTRAVENOUS | Status: DC
  Filled 2022-08-24: qty 1000

## 2022-08-24 MED ORDER — ONDANSETRON HCL 4 MG/2ML IJ SOLN
4.0000 mg | Freq: Once | INTRAMUSCULAR | Status: AC
  Administered 2022-08-24: 4 mg via INTRAVENOUS
  Filled 2022-08-24: qty 2

## 2022-08-24 MED ORDER — ONDANSETRON HCL 4 MG/2ML IJ SOLN
INTRAMUSCULAR | Status: AC
  Filled 2022-08-24: qty 2

## 2022-08-24 MED ORDER — FENTANYL CITRATE PF 50 MCG/ML IJ SOSY: 25.0000 ug | PREFILLED_SYRINGE | INTRAMUSCULAR | Status: DC | PRN

## 2022-08-24 MED ORDER — MAGNESIUM SULFATE 2 GM/50ML IV SOLN
2.0000 g | Freq: Once | INTRAVENOUS | Status: AC
  Administered 2022-08-25: 2 g via INTRAVENOUS
  Filled 2022-08-24: qty 50

## 2022-08-24 MED ORDER — POLYETHYLENE GLYCOL 3350 17 G PO PACK: 17.0000 g | PACK | Freq: Every day | ORAL | Status: DC

## 2022-08-24 MED ORDER — OXYCODONE HCL 5 MG PO TABS
5.0000 mg | ORAL_TABLET | Freq: Once | ORAL | Status: DC | PRN
Start: 2022-08-24 — End: 2022-08-24

## 2022-08-24 MED ORDER — DIPHENHYDRAMINE HCL 25 MG PO CAPS: 25.0000 mg | ORAL_CAPSULE | Freq: Four times a day (QID) | ORAL | Status: DC | PRN

## 2022-08-24 MED ORDER — PHENYLEPHRINE HCL-NACL 20-0.9 MG/250ML-% IV SOLN
0.0000 ug/min | INTRAVENOUS | Status: DC
  Administered 2022-08-24: 45 ug/min via INTRAVENOUS

## 2022-08-24 MED ORDER — CHLORHEXIDINE GLUCONATE 0.12 % MT SOLN
15.0000 mL | Freq: Once | OROMUCOSAL | Status: AC
  Administered 2022-08-24: 15 mL via OROMUCOSAL

## 2022-08-24 MED ORDER — FENTANYL 2500MCG IN NS 250ML (10MCG/ML) PREMIX INFUSION
0.0000 ug/h | INTRAVENOUS | Status: DC
  Administered 2022-08-24: 50 ug/h via INTRAVENOUS
  Administered 2022-08-25: 350 ug/h via INTRAVENOUS
  Administered 2022-08-25: 250 ug/h via INTRAVENOUS
  Administered 2022-08-25 – 2022-08-26 (×2): 350 ug/h via INTRAVENOUS
  Administered 2022-08-26: 400 ug/h via INTRAVENOUS
  Administered 2022-08-26: 350 ug/h via INTRAVENOUS
  Administered 2022-08-27 (×2): 400 ug/h via INTRAVENOUS
  Filled 2022-08-24 (×9): qty 250

## 2022-08-24 MED ORDER — KETOROLAC TROMETHAMINE 30 MG/ML IJ SOLN: 30.0000 mg | Freq: Once | INTRAMUSCULAR | Status: DC | PRN

## 2022-08-24 MED ORDER — SODIUM CHLORIDE 0.9 % IV SOLN
250.0000 mL | INTRAVENOUS | Status: DC
  Administered 2022-08-24: 250 mL via INTRAVENOUS

## 2022-08-24 MED ORDER — FENTANYL CITRATE (PF) 250 MCG/5ML IJ SOLN
INTRAMUSCULAR | Status: AC
  Filled 2022-08-24: qty 5

## 2022-08-24 SURGICAL SUPPLY — 54 items
APL SWBSTK 6 STRL LF DISP (MISCELLANEOUS)
APPLICATOR COTTON TIP 6 STRL (MISCELLANEOUS) ×2 IMPLANT
APPLICATOR COTTON TIP 6IN STRL (MISCELLANEOUS) IMPLANT
BAG COUNTER SPONGE SURGICOUNT (BAG) IMPLANT
BAG SPNG CNTER NS LX DISP (BAG)
BLADE EXTENDED COATED 6.5IN (ELECTRODE) IMPLANT
CELLS DAT CNTRL 66122 CELL SVR (MISCELLANEOUS) IMPLANT
CLIP TI LARGE 6 (CLIP) IMPLANT
COVER SURGICAL LIGHT HANDLE (MISCELLANEOUS) ×2 IMPLANT
DRSG OPSITE POSTOP 4X10 (GAUZE/BANDAGES/DRESSINGS) IMPLANT
DRSG OPSITE POSTOP 4X8 (GAUZE/BANDAGES/DRESSINGS) IMPLANT
ELECT REM PT RETURN 15FT ADLT (MISCELLANEOUS) ×1 IMPLANT
GAUZE PAD ABD 8X10 STRL (GAUZE/BANDAGES/DRESSINGS) IMPLANT
GAUZE SPONGE 4X4 12PLY STRL (GAUZE/BANDAGES/DRESSINGS) ×1 IMPLANT
GLOVE BIO SURGEON STRL SZ7 (GLOVE) ×2 IMPLANT
GLOVE BIOGEL PI IND STRL 7.0 (GLOVE) ×1 IMPLANT
GLOVE BIOGEL PI IND STRL 7.5 (GLOVE) ×2 IMPLANT
GOWN STRL REUS W/ TWL LRG LVL3 (GOWN DISPOSABLE) ×2 IMPLANT
GOWN STRL REUS W/TWL LRG LVL3 (GOWN DISPOSABLE) ×2
HANDLE SUCTION POOLE (INSTRUMENTS) IMPLANT
KIT TURNOVER KIT A (KITS) IMPLANT
LIGASURE IMPACT 36 18CM CVD LR (INSTRUMENTS) IMPLANT
NS IRRIG 1000ML POUR BTL (IV SOLUTION) ×2 IMPLANT
PACK COLON (CUSTOM PROCEDURE TRAY) ×1 IMPLANT
PENCIL SMOKE EVACUATOR (MISCELLANEOUS) ×1 IMPLANT
RELOAD PROXIMATE 75MM BLUE (ENDOMECHANICALS) ×3 IMPLANT
RELOAD STAPLE 75 3.8 BLU REG (ENDOMECHANICALS) IMPLANT
RETRACTOR WND ALEXIS 18 MED (MISCELLANEOUS) IMPLANT
RETRACTOR WND ALEXIS 25 LRG (MISCELLANEOUS) IMPLANT
RTRCTR WOUND ALEXIS 18CM MED (MISCELLANEOUS)
RTRCTR WOUND ALEXIS 25CM LRG (MISCELLANEOUS)
SHEARS HARMONIC ACE PLUS 36CM (ENDOMECHANICALS) IMPLANT
SPONGE ABD ABTHERA ADVANCE (MISCELLANEOUS) IMPLANT
SPONGE T-LAP 18X18 ~~LOC~~+RFID (SPONGE) IMPLANT
STAPLER GUN LINEAR PROX 60 (STAPLE) IMPLANT
STAPLER PROXIMATE 75MM BLUE (STAPLE) IMPLANT
STAPLER VISISTAT 35W (STAPLE) ×1 IMPLANT
SUCTION POOLE HANDLE (INSTRUMENTS) ×1
SUT NOV 1 T60/GS (SUTURE) IMPLANT
SUT NOVA NAB DX-16 0-1 5-0 T12 (SUTURE) IMPLANT
SUT NOVA T20/GS 25 (SUTURE) IMPLANT
SUT PDS AB 1 TP1 96 (SUTURE) ×2 IMPLANT
SUT SILK 2 0 (SUTURE) ×1
SUT SILK 2 0 SH CR/8 (SUTURE) ×1 IMPLANT
SUT SILK 2 0SH CR/8 30 (SUTURE) IMPLANT
SUT SILK 2-0 18XBRD TIE 12 (SUTURE) ×1 IMPLANT
SUT SILK 2-0 30XBRD TIE 12 (SUTURE) IMPLANT
SUT SILK 3 0 (SUTURE)
SUT SILK 3 0 SH CR/8 (SUTURE) ×1 IMPLANT
SUT SILK 3-0 18XBRD TIE 12 (SUTURE) ×2 IMPLANT
TOWEL OR 17X26 10 PK STRL BLUE (TOWEL DISPOSABLE) IMPLANT
TRAY FOLEY MTR SLVR 16FR STAT (SET/KITS/TRAYS/PACK) ×1 IMPLANT
TUBING CONNECTING 10 (TUBING) ×2 IMPLANT
WATER STERILE IRR 1000ML POUR (IV SOLUTION) IMPLANT

## 2022-08-24 NOTE — Anesthesia Preprocedure Evaluation (Addendum)
Anesthesia Evaluation  Patient identified by MRN, date of birth, ID band Patient awake    Reviewed: Allergy & Precautions, NPO status , Patient's Chart, lab work & pertinent test results  Airway Mallampati: III  TM Distance: >3 FB Neck ROM: Full    Dental no notable dental hx.    Pulmonary former smoker   Pulmonary exam normal        Cardiovascular negative cardio ROS Normal cardiovascular exam     Neuro/Psych negative neurological ROS  negative psych ROS   GI/Hepatic negative GI ROS, Neg liver ROS,,,  Endo/Other  Hypothyroidism    Renal/GU Renal disease     Musculoskeletal  (+) Arthritis ,    Abdominal   Peds  Hematology negative hematology ROS (+)   Anesthesia Other Findings FREE AIR IN ABDOMEN  Reproductive/Obstetrics S/p BTL Hcg negative                             Anesthesia Physical Anesthesia Plan  ASA: 2 and emergent  Anesthesia Plan: General   Post-op Pain Management:    Induction: Intravenous  PONV Risk Score and Plan: 4 or greater and Ondansetron, Dexamethasone, Midazolam, Scopolamine patch - Pre-op and Treatment may vary due to age or medical condition  Airway Management Planned: Oral ETT  Additional Equipment:   Intra-op Plan:   Post-operative Plan: Extubation in OR  Informed Consent: I have reviewed the patients History and Physical, chart, labs and discussed the procedure including the risks, benefits and alternatives for the proposed anesthesia with the patient or authorized representative who has indicated his/her understanding and acceptance.     Dental advisory given  Plan Discussed with: CRNA  Anesthesia Plan Comments:        Anesthesia Quick Evaluation

## 2022-08-24 NOTE — Progress Notes (Signed)
Radiology in room

## 2022-08-24 NOTE — Transfer of Care (Signed)
Immediate Anesthesia Transfer of Care Note  Patient: Brittney Herrera  Procedure(s) Performed: EXPLORATORY LAPAROTOMY, SMALL BOWEL RESECTION AND REMOVAL OF APPENDIX  Patient Location: ICU  Anesthesia Type:General  Level of Consciousness: Patient remains intubated per anesthesia plan  Airway & Oxygen Therapy: Patient remains intubated per anesthesia plan and Patient placed on Ventilator (see vital sign flow sheet for setting)  Post-op Assessment: Report given to RN and Post -op Vital signs reviewed and stable  Post vital signs: Reviewed and stable  Last Vitals:  Vitals Value Taken Time  BP    Temp    Pulse 108 08/24/22 1948  Resp    SpO2 100 % 08/24/22 1948  Vitals shown include unvalidated device data.  Last Pain:  Vitals:   08/24/22 1634  TempSrc: Oral  PainSc: 10-Worst pain ever         Complications: No notable events documented.

## 2022-08-24 NOTE — Anesthesia Procedure Notes (Signed)
Procedure Name: Intubation Date/Time: 08/24/2022 5:13 PM  Performed by: Kizzie Fantasia, CRNAPre-anesthesia Checklist: Patient identified, Emergency Drugs available, Patient being monitored, Suction available and Timeout performed Patient Re-evaluated:Patient Re-evaluated prior to induction Oxygen Delivery Method: Circle system utilized Preoxygenation: Pre-oxygenation with 100% oxygen Induction Type: IV induction and Rapid sequence Laryngoscope Size: Mac and 3 Grade View: Grade II Tube type: Oral Tube size: 7.0 mm Number of attempts: 1 Airway Equipment and Method: Stylet Placement Confirmation: ETT inserted through vocal cords under direct vision, positive ETCO2 and breath sounds checked- equal and bilateral Secured at: 21 cm Tube secured with: Tape Dental Injury: Teeth and Oropharynx as per pre-operative assessment

## 2022-08-24 NOTE — Progress Notes (Signed)
Arterial blood gas drawn on the following settings and sent to the Lab at 21:15    08/24/22 2000  Adult Ventilator Settings  Vent Type Servo i  Humidity HME  Vent Mode PRVC  Vt Set 420 mL  Ve 7.6 mL  Set Rate 18 bmp  FiO2 (%) 60 %  I Time 0.9 Sec(s)  I:E Ratio Set 1:2.7  PEEP 5 cmH20

## 2022-08-24 NOTE — H&P (Signed)
Brittney Herrera 07/05/1975  [id number].    Chief Complaint/Reason for Consult: pneumoperitoneum   HPI:  Brittney Herrera is a 47 y/o F with history of anxiety, hypothyroidism, RA (not on steroids or biologics), who has had a known pelvic mass that is follow by Dr. Pricilla Holm with [facility].  She underwent attempted resection in 2022 but given this was so adhered to multiple surrounding structures, it was unable to be removed.  She has to return several days later due to concern for a bowel injury.  This was not identified.  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  Biopsies were obtained at this time and this was not found to be malignant.  This has been monitored routinely with Dr. Pricilla Holm.   Her last MRI revealed this was smaller in size.  She did undergo an extensive open incisional hernia repair with Bard mesh with a posterior rectus sheath release and B transversus abdominis release on the right as well by Dr. Dossie Der in November 2023.    The patient has been in her normal state of health until this past Saturday when she developed an acute onset of pelvic cramping and pain.  She then developed some nausea and vomiting about once a day.  She denies any hematemesis.  She admits to some diarrhea but states this is not completely abnormal.  She denies any blood in her stool.  She denies fevers or chills, but is very hot here.  Her pain has tracked up her left flank/side and into her chest.  She denies any history of diverticulitis or diverticulosis that she knows of.  She finally was having so much pain she presented to the [facility] today where she has been found to have free air in her pelvic, RTP, and in her mediastinum.  She does complain of some chest pain as well.  Her WBC is normal.  Her lactic acid is elevated at 2.7, now down 2.3. her BMET has several abnormalities but will be corrected  with fluid repletion.  We have been asked to see her for further evaluation and recommendations.  ROS: ROS: see HPI  Family History  Problem Relation Age of Onset   Diabetes Father    Leukemia Father    Heart disease Maternal Grandmother    Heart disease Maternal Grandfather    Heart disease Paternal Grandmother    Skin cancer Other    Colon cancer Neg Hx    Breast cancer Neg Hx    Ovarian cancer Neg Hx    Endometrial cancer Neg Hx    Prostate cancer Neg Hx    Pancreatic cancer Neg Hx     Past Medical History:  Diagnosis Date   Arthritis    Bicornuate uterus    Hemorrhoids 11/18/2020   History of abnormal cervical Pap smear    Hypertriglyceridemia 11/18/2020   Hypothyroidism    Rheumatoid aortitis    Thyroid disease    Umbilical hernia 11/28/2020    Past Surgical History:  Procedure Laterality Date   ABDOMINAL ADVANCEMENT FLAP Bilateral 01/05/2022   Procedure: ABDOMINAL ADVANCEMENT FLAP;  Surgeon: Quentin Ore, MD;  Location: [facility] OR;  Service: General;  Laterality: Bilateral;  Bilateral posterior rectus sheath and right sided transversus abdominis myofascial advancement flaps   ADENOIDECTOMY  02/22/1982   CESAREAN SECTION  04/07/2005   CESAREAN SECTION  09/24/2011   cyst N/A  12/15/2020   2 scalp cysts removed, benign   CYSTOSCOPY W/ RETROGRADES  01/22/2021   Procedure: CYSTOSCOPY WITH RETROGRADE PYELOGRAM;FISTULOGRAM;  Surgeon: Belva Agee, MD;  Location: [facility];  Service: Urology;;   DILATATION & CURETTAGE/HYSTEROSCOPY WITH MYOSURE N/A 01/20/2021   Procedure: DILATATION & CURETTAGE/HYSTEROSCOPY WITH MYOSURE;  Surgeon: Carver Fila, MD;  Location: [facility];  Service: Gynecology;  Laterality: N/A;   DILATION AND CURETTAGE, DIAGNOSTIC / THERAPEUTIC  12/23/2012   FLEXIBLE SIGMOIDOSCOPY N/A 01/22/2021   Procedure: DIAGNOSTIC FLEXIBLE SIGMOIDOSCOPY;  Surgeon: Andria Meuse, MD;  Location: [facility];  Service: General;  Laterality: N/A;   INCISIONAL  HERNIA REPAIR N/A 01/05/2022   Procedure: OPEN INCISIONAL HERNIA REPAIR WITH BARD MESH;  Surgeon: Quentin Ore, MD;  Location: [facility];  Service: General;  Laterality: N/A;   INSERTION OF MESH  01/05/2022   Procedure: INSERTION OF MESH;  Surgeon: Quentin Ore, MD;  Location: [facility];  Service: General;;   LAPAROTOMY N/A 01/22/2021   Procedure: EXPLORATORY LAPAROTOMY;  Surgeon: Carver Fila, MD;  Location: [facility];  Service: Gynecology;  Laterality: N/A;   LAPAROTOMY N/A 01/22/2021   Procedure: EXPLORATORY LAPAROTOMY;  Surgeon: Andria Meuse, MD;  Location: [facility];  Service: General;  Laterality: N/A;   MYRINGOTOMY WITH TUBE PLACEMENT     TUBAL LIGATION Bilateral 12/23/2012   WISDOM TOOTH EXTRACTION  02/22/1993   XI ROBOTIC ASSISTED SALPINGECTOMY Right 01/20/2021   Procedure: XI ROBOTIC ASSISTED RIGHT  SALPINGECTOMY, LEFT OVARIAN BIOPSY;  Surgeon: Carver Fila, MD;  Location: [facility];  Service: Gynecology;  Laterality: Right;    Social History:  reports that she quit smoking about 11 years ago. Her smoking use included cigarettes. She has a 5.00 pack-year smoking history. She has never used smokeless tobacco. She reports current alcohol use of about 6.0 standard drinks of alcohol per week. She reports that she does not use drugs.  Allergies:  Allergies  Allergen Reactions   Prednisone Other (See Comments)    Facial swelling      (Not in a hospital admission)    Physical Exam: Blood pressure (!) 153/99, pulse (!) 104, temperature 97.9 F (36.6 C), temperature source Oral, resp. rate 18, height 5\' 3"  (1.6 m), weight 85 kg, SpO2 91 %. General: pleasant, WD, WN, very anxious white female who is laying in bed in distress secondary to pain HEENT: head is normocephalic, atraumatic.  Sclera are noninjected.  PERRL.  Ears and nose without any masses or lesions.  Mouth is pink and dry Heart: regular rhythm, tachy in 110s.  Normal s1,s2. No obvious murmurs, gallops,  or rubs noted.  Palpable radial and pedal pulses bilaterally Lungs: CTAB, no wheezes, rhonchi, or rales noted.  Respiratory effort nonlabored Abd: soft,but distended, and very peritonitic.  Unable to palpate her abdomen secondary to pain MS: all 4 extremities are symmetrical with no cyanosis, clubbing, or edema. Skin: warm and dry with no masses, lesions, or rashes Psych: A&Ox3 but very anxious   Results for orders placed or performed during the hospital encounter of 08/24/22 (from the past 48 hour(s))  CBC with Differential     Status: Abnormal   Collection Time: 08/24/22 11:29 AM  Result Value Ref Range   WBC 8.2 4.0 - 10.5 K/uL   RBC 4.75 3.87 - 5.11 MIL/uL   Hemoglobin 15.3 (H) 12.0 - 15.0 g/dL   HCT 81.1 91.4 - 78.2 %   MCV 94.5 80.0 - 100.0 fL   MCH 32.2  26.0 - 34.0 pg   MCHC 34.1 30.0 - 36.0 g/dL   RDW 16.1 09.6 - 04.5 %   Platelets 300 150 - 400 K/uL   nRBC 0.0 0.0 - 0.2 %   Neutrophils Relative % 89 %   Neutro Abs 7.3 1.7 - 7.7 K/uL   Lymphocytes Relative 5 %   Lymphs Abs 0.4 (L) 0.7 - 4.0 K/uL   Monocytes Relative 5 %   Monocytes Absolute 0.4 0.1 - 1.0 K/uL   Eosinophils Relative 0 %   Eosinophils Absolute 0.0 0.0 - 0.5 K/uL   Basophils Relative 0 %   Basophils Absolute 0.0 0.0 - 0.1 K/uL   WBC Morphology DOHLE BODIES    Immature Granulocytes 1 %   Abs Immature Granulocytes 0.06 0.00 - 0.07 K/uL   Polychromasia PRESENT     Comment: Performed at [facility], [address] [place], [place] [address]  Comprehensive metabolic panel     Status: Abnormal   Collection Time: 08/24/22 11:29 AM  Result Value Ref Range   Sodium 131 (L) 135 - 145 mmol/L   Potassium 3.1 (L) 3.5 - 5.1 mmol/L   Chloride 95 (L) 98 - 111 mmol/L   CO2 22 22 - 32 mmol/L   Glucose, Bld 158 (H) 70 - 99 mg/dL    Comment: Glucose reference range applies only to samples taken after fasting for at least 8 hours.   BUN 20 6 - 20 mg/dL   Creatinine, Ser 1.91 (H) 0.44 - 1.00 mg/dL    Calcium 8.8 (L) 8.9 - 10.3 mg/dL   Total Protein 7.4 6.5 - 8.1 g/dL   Albumin 2.9 (L) 3.5 - 5.0 g/dL   AST 31 15 - 41 U/L   ALT 31 0 - 44 U/L   Alkaline Phosphatase 129 (H) 38 - 126 U/L   Total Bilirubin 2.0 (H) 0.3 - 1.2 mg/dL   GFR, Estimated >47 >82 mL/min    Comment: (NOTE) Calculated using the CKD-EPI Creatinine Equation (2021)    Anion gap 14 5 - 15    Comment: Performed at [facility], [address], [place], [place] [address]  Lipase, blood     Status: None   Collection Time: 08/24/22 11:29 AM  Result Value Ref Range   Lipase 15 11 - 51 U/L    Comment: Performed at [facility], [address] [place], [place] [address]  Lactic acid, plasma     Status: Abnormal   Collection Time: 08/24/22 11:31 AM  Result Value Ref Range   Lactic Acid, Venous 2.7 (HH) 0.5 - 1.9 mmol/L    Comment: CRITICAL RESULT CALLED TO, READ BACK BY AND VERIFIED WITH RN Dale Chula AT 1229 08/24/22 CRUICKSHANK A Performed at [facility], [address] [place], [place] [address]   Troponin I (High Sensitivity)     Status: None   Collection Time: 08/24/22 11:39 AM  Result Value Ref Range   Troponin I (High Sensitivity) 5 <18 ng/L    Comment: (NOTE) Elevated high sensitivity troponin I (hsTnI) values and significant  changes across serial measurements may suggest ACS but many other  chronic and acute conditions are known to elevate hsTnI results.  Refer to the "Links" section for chest pain algorithms and additional  guidance. Performed at [facility], [address] [place], [place] [address]   Urinalysis, Routine w reflex microscopic -Urine, Clean Catch     Status: Abnormal   Collection  Time: 08/24/22 12:47 PM  Result Value Ref Range   Color, Urine AMBER (A) YELLOW    Comment: BIOCHEMICALS MAY BE AFFECTED BY COLOR   APPearance HAZY (A) CLEAR   Specific Gravity, Urine 1.028 1.005 - 1.030   pH 5.0 5.0 - 8.0    Glucose, UA NEGATIVE NEGATIVE mg/dL   Hgb urine dipstick NEGATIVE NEGATIVE   Bilirubin Urine NEGATIVE NEGATIVE   Ketones, ur 20 (A) NEGATIVE mg/dL   Protein, ur 161 (A) NEGATIVE mg/dL   Nitrite NEGATIVE NEGATIVE   Leukocytes,Ua TRACE (A) NEGATIVE   RBC / HPF 0-5 0 - 5 RBC/hpf   WBC, UA 21-50 0 - 5 WBC/hpf   Bacteria, UA RARE (A) NONE SEEN   Squamous Epithelial / HPF 6-10 0 - 5 /HPF   Mucus PRESENT     Comment: Performed at [facility], [address] [place], [place] [address]  Pregnancy, urine     Status: None   Collection Time: 08/24/22 12:47 PM  Result Value Ref Range   Preg Test, Ur NEGATIVE NEGATIVE    Comment:        THE SENSITIVITY OF THIS METHODOLOGY IS >25 mIU/mL. Performed at [facility], [address] [place], [place] [address]    CT ABDOMEN PELVIS W CONTRAST  Result Date: 08/24/2022 CLINICAL DATA:  Abdominal pain, pain left lower quadrant EXAM: CT ABDOMEN AND PELVIS WITH CONTRAST TECHNIQUE: Multidetector CT imaging of the abdomen and pelvis was performed using the standard protocol following bolus administration of intravenous contrast. RADIATION DOSE REDUCTION: This exam was performed according to the departmental dose-optimization program which includes automated exposure control, adjustment of the mA and/or kV according to patient size and/or use of iterative reconstruction technique. CONTRAST:  OMNIPAQUE IOHEXOL 350 MG/ML SOLN COMPARISON:  None Available. FINDINGS: Lower chest: There are patchy infiltrates in both lower lung fields suggesting atelectasis/pneumonia. Hepatobiliary: Liver is enlarged measuring 24.8 cm in length. The decreased density in liver in comparison to the spleen suggesting fatty infiltration. There is no dilation of bile ducts. Gallbladder is unremarkable. Pancreas: No focal abnormalities are seen. Spleen: Unremarkable. Adrenals/Urinary Tract: Adrenals are unremarkable. There is no hydronephrosis. There is  4 mm calculus in the upper midportion of left kidney. There are few low-density foci in renal cortex measuring up to 10 mm suggesting renal cysts. Ureters are not dilated. Urinary bladder is not distended. There are no pockets of air in the lumen of the bladder. Stomach/Bowel: Stomach is unremarkable. There is no dilation of small bowel loops. Appendix is not seen. Scattered diverticula seen in colon. There is diffuse wall thickening in sigmoid colon. Large amount of free air is noted in retroperitoneum extending into the mesentery. Pneumoperitoneum is seen. Vascular/Lymphatic: Vascular structures are unremarkable. There are slightly enlarged lymph nodes in retroperitoneum and mesentery. Reproductive: Uterus is displaced anteriorly. Endometrial cavity in the fundus appears be divided suggesting possible septate uterus. Other: There are multiple pockets of air in retroperitoneum and pockets of pneumoperitoneum. There is small amount of fluid in the mesentery. Small ascites is present. Musculoskeletal: No acute findings are seen. IMPRESSION: Large amount of air is seen in the retroperitoneum. There is pneumoperitoneum. Findings suggest bowel perforation, possibly in sigmoid colon. There is diffuse wall thickening in sigmoid colon suggesting colitis. Less likely possibility would be acute diverticulitis. Multiple diverticula are seen in colon. There is no loculated pericolic fluid collection. When the patient's clinical condition permits, endoscopy should be considered to rule out any underlying neoplastic  process. There is no evidence of small-bowel obstruction. There is no hydronephrosis. Enlarged fatty liver. Left renal calculus. Few renal cysts. Patchy infiltrates in the lower lung fields may suggest atelectasis/pneumonia. Imaging findings were relayed to patient's provider Dr. Hattie Perch by telephone call at 2:38 p.m. on 08/24/2022. Electronically Signed   By: Ernie Avena M.D.   On: 08/24/2022 14:42   CT  Angio Chest PE W and/or Wo Contrast  Result Date: 08/24/2022 CLINICAL DATA:  Chest pain, abdominal pain, back pain EXAM: CT ANGIOGRAPHY CHEST WITH CONTRAST TECHNIQUE: Multidetector CT imaging of the chest was performed using the standard protocol during bolus administration of intravenous contrast. Multiplanar CT image reconstructions and MIPs were obtained to evaluate the vascular anatomy. RADIATION DOSE REDUCTION: This exam was performed according to the departmental dose-optimization program which includes automated exposure control, adjustment of the mA and/or kV according to patient size and/or use of iterative reconstruction technique. CONTRAST:  OMNIPAQUE IOHEXOL 350 MG/ML SOLN COMPARISON:  None Available. FINDINGS: Cardiovascular: There is homogeneous enhancement in thoracic aorta. Ascending thoracic aorta measures 3.4 cm. There are no intraluminal filling defects in central pulmonary artery branches. Infiltrates and motion artifacts limit evaluation of small peripheral pulmonary artery branches. Mediastinum/Nodes: Pneumomediastinum is seen extending into the lower neck. Pneumoperitoneum is noted in upper abdomen. Lungs/Pleura: There are patchy infiltrates in both lower lung fields. Faint ground-glass densities are seen in both lungs. There is no pleural effusion or pneumothorax. Upper Abdomen: There is fatty infiltration in liver. There is pneumoperitoneum. There are pockets of air in retroperitoneum. Musculoskeletal: There is decrease in height of the bodies of T2 and T6 vertebrae with no break in the cortical margins. Schmorl's nodes are seen in the upper endplates of bodies of T10 and T11 vertebrae. Review of the MIP images confirms the above findings. IMPRESSION: There is no evidence of central pulmonary artery embolism. There is no evidence of thoracic aortic dissection. There are patchy infiltrates in both lower lung fields suggesting atelectasis/pneumonia. There are faint ground-glass  densities in both lungs suggesting possible pulmonary edema. Pneumomediastinum extending into the lower neck. There is pneumoperitoneum in the upper abdomen suggesting possible bowel perforation. Fatty liver. Decrease in height of multiple thoracic vertebral bodies may be residual from previous injury. Electronically Signed   By: Ernie Avena M.D.   On: 08/24/2022 14:11   US PELVIC COMPLETE WITH TRANSVAGINAL  Result Date: 08/24/2022 CLINICAL DATA:  Abdominal pain EXAM: TRANSABDOMINAL AND TRANSVAGINAL ULTRASOUND OF PELVIS TECHNIQUE: Both transabdominal and transvaginal ultrasound examinations of the pelvis were performed. Transabdominal technique was performed for global imaging of the pelvis including uterus, ovaries, adnexal regions, and pelvic cul-de-sac. It was necessary to proceed with endovaginal exam following the transabdominal exam to visualize the adnexa and endometrium. Of note as per the sonographer the patient had difficulty tolerating the procedure including the endovaginal portion of the exam COMPARISON:  MRI 11/13/2021 FINDINGS: Uterus Measurements: 10.1 x 4.7 x 6.4 cm = volume: 158.0 mL. Poorly visualized. No obvious mass lesion. On prior MRI there was evidence of a septated uterus and areas of adenomyosis. Endometrium Thickness: 5 mm.  No focal abnormality visualized. Ovaries Not well seen today Other findings No abnormal free fluid. Transabdominal images are also limited due to contracted urinary bladder and overlapping bowel gas and soft tissue. Poor sonographic window. IMPRESSION: Very limited study. Ovaries are not well seen. No separate adnexal mass or fluid. Additional cross-sectional imaging workup as clinically appropriate Electronically Signed   By: Piedad Climes.D.  On: 08/24/2022 12:55      Assessment/Plan Pneumoperitoneum, retroperitoneal air, mediastinal air likely secondary to perforated colon with history of pelvic mass The patient has been seen, examined, chart,  labs, vitals, and imaging personally reviewed.  I have also spoken with Dr. Pricilla Holm about her case as well.  The patient appears to likely have perforated her colon, possibly at the site of this previous pelvic mass.  This isn't overtly seen on imaging today and her most recent MRI revealed this was smaller; however, she doesn't have a history of diverticulitis and this air pattern isn't completely consistent with this.  Either way, the patient is ill and has peritonitis and needs to go emergently to the OR.  She will likely require possible bowel resection if we are able with a colostomy versus possible just diverting ostomy if we are unable due to adhesive disease and adherence of possible mass.  The surgery including risks and complications such as bleeding, infection, new hernia, open wound, injury to surrounding structures, fistula, post op abscess requiring drain, blood clots, pneumonia, etc.  She understands and is agreeable to proceed.   FEN - NPO/IVFs VTE - lovenox to start post op ID - [id number] Admit - inpatient  Hypokalemia - replace in fluids, BMET in am Hyponatremia - replace in fluids, BMET in am Hypothyroidism - resume home meds when dose verified with pharmacy Anxiety - may need ativan RA  I reviewed ED provider notes, [facility] notes, last 24 h vitals and pain scores, last 48 h intake and output, last 24 h labs and trends, and last 24 h imaging results.  Letha Cape, [facility] Surgery 08/24/2022, 4:13 PM Please see Amion for pager number during day hours 7:00am-4:30pm or 7:00am -11:30am on weekends

## 2022-08-24 NOTE — Progress Notes (Signed)
eLink Physician-Brief Progress Note Patient Name: Gardenia Beier DOB: 1975/12/01 MRN: [id number]   Date of Service  08/24/2022  HPI/Events of Note  KUB needs check. Patient needs orders for Foley and Phenylephrine gtt.  eICU Interventions  OG tube position verified. Foley and Neo orders entered.        Thomasene Lot Tarig Zimmers 08/24/2022, 9:06 PM

## 2022-08-24 NOTE — ED Notes (Signed)
Short stay called for report patient to be transported soon

## 2022-08-24 NOTE — Progress Notes (Signed)
An USGPIV (ultrasound guided PIV) has been placed for short-term vasopressor infusion. A correctly placed ivWatch must be used when administering vasopressors. Should this treatment be needed beyond 72 hours, central line access should be obtained.  It will be the responsibility of the bedside nurse to follow best practice to prevent extravasations.   

## 2022-08-24 NOTE — Progress Notes (Signed)
eLink Physician-Brief Progress Note Patient Name: Brittney Herrera DOB: Jul 06, 1975 MRN: [id number]   Date of Service  08/24/2022  HPI/Events of Note  Patient with perforated colon with general peritonitis and retroperitoneal abscess who is s/p exploratory laparotomy, admitted to ICU with post-op respiratory failure.  eICU Interventions  New Patient Evaluation.        Thomasene Lot [name] 08/24/2022, 8:51 PM

## 2022-08-24 NOTE — ED Provider Notes (Signed)
[place] EMERGENCY DEPARTMENT AT [facility] Provider Note   CSN: [id number] Arrival date & time: 08/24/22  1116     History {Add pertinent medical, surgical, social history, OB history to HPI:1} Chief Complaint  Patient presents with   Abdominal Pain    [place] Kelcie Banaszak is a 47 y.o. female.  47 year old female presenting to the emergency department c/o severe abdominal pain over the last 4 days.  Patient reports that she follows with Dr. Pricilla Holm here and notified them that she was coming to the ER. She has a complex past medical history including hypothyroidism and a recent hernia repair November of last year.  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  She has had multiple surgeries in attempt to remove this mass, however it does adhere to the sigmoid colon and would likely a colostomy if they attempt to remove that part.  She has previously noticed with fistulas, however these may have resolved over time when reviewing prior assessment and plans from her talker.  Patient and her husband state that the plan for the mass was to continue to observe it and eventually obtain another MRI.  Her recent abdominal pain began 4 days ago and is associated with chest pain and shortness of breath.  They deny any fevers or urinary symptoms.   Abdominal Pain Associated symptoms: chest pain and shortness of breath        Home Medications Prior to Admission medications   Medication Sig Start Date End Date Taking? Authorizing Provider  gabapentin (NEURONTIN) 300 MG capsule Take 1 capsule (300 mg total) by mouth 3 (three) times daily. 01/07/22   Stechschulte, Hyman Hopes, MD  ibuprofen (ADVIL) 800 MG tablet Take 1 tablet (800 mg total) by mouth every 8 (eight) hours as needed. 01/07/22   Stechschulte, Hyman Hopes, MD  levothyroxine (SYNTHROID) 150 MCG tablet Take 150 mcg by mouth daily before breakfast.    [provider]   methocarbamol (ROBAXIN-750) 750 MG tablet Take 1 tablet (750 mg total) by mouth 4 (four) times daily. 01/07/22   Stechschulte, Hyman Hopes, MD  oxyCODONE-acetaminophen (PERCOCET) 5-325 MG tablet Take 1 tablet by mouth every 4 (four) hours as needed for severe pain. 01/07/22 01/07/23  Stechschulte, Hyman Hopes, MD      Allergies    Prednisone    Review of Systems   Review of Systems  Respiratory:  Positive for shortness of breath.   Cardiovascular:  Positive for chest pain.  Gastrointestinal:  Positive for abdominal pain.  All other systems reviewed and are negative.   Physical Exam Updated Vital Signs BP (!) 149/101   Pulse (!) 107   Resp (!) 22   Ht 5\' 3"  (1.6 m)   Wt 85 kg   SpO2 100%   BMI 33.19 kg/m  Physical Exam Vitals reviewed.  Constitutional:      General: She is in acute distress.     Appearance: She is ill-appearing.  HENT:     Head: Normocephalic.     Mouth/Throat:     Mouth: Mucous membranes are moist.  Cardiovascular:     Rate and Rhythm: Tachycardia present.     Heart sounds: Normal heart sounds.  Pulmonary:     Breath sounds: Normal breath sounds.     Comments: Tachypnea Abdominal:     General: A surgical scar is present. There is distension.     Tenderness: There is generalized abdominal tenderness.  There is no guarding or rebound.  Skin:    General: Skin is warm.     Capillary Refill: Capillary refill takes less than 2 seconds.  Neurological:     General: No focal deficit present.     Mental Status: She is alert.     ED Results / Procedures / Treatments   Labs (all labs ordered are listed, but only abnormal results are displayed) Labs Reviewed  CBC WITH DIFFERENTIAL/PLATELET - Abnormal; Notable for the following components:      Result Value   Hemoglobin 15.3 (*)    Lymphs Abs 0.4 (*)    All other components within normal limits  COMPREHENSIVE METABOLIC PANEL - Abnormal; Notable for the following components:   Sodium 131 (*)    Potassium 3.1  (*)    Chloride 95 (*)    Glucose, Bld 158 (*)    Creatinine, Ser 1.06 (*)    Calcium 8.8 (*)    Albumin 2.9 (*)    Alkaline Phosphatase 129 (*)    Total Bilirubin 2.0 (*)    All other components within normal limits  LIPASE, BLOOD  URINALYSIS, ROUTINE W REFLEX MICROSCOPIC  PREGNANCY, URINE  LACTIC ACID, PLASMA  TROPONIN I (HIGH SENSITIVITY)    EKG None  Radiology No results found.  Procedures Procedures  {Document cardiac monitor, telemetry assessment procedure when appropriate:1}  Medications Ordered in ED Medications  morphine (PF) 4 MG/ML injection 4 mg (4 mg Intravenous Given 08/24/22 1143)  ondansetron (ZOFRAN) injection 4 mg (4 mg Intravenous Given 08/24/22 1142)  lactated ringers bolus 1,000 mL (1,000 mLs Intravenous New Bag/Given 08/24/22 1144)    ED Course/ Medical Decision Making/ A&P   {   Click here for ABCD2, HEART and other calculatorsREFRESH Note before signing :1}                          Medical Decision Making 47 year old female with a complex past medical history including a large adnexal mass that is being monitored with Dr. Pricilla Holm.  Patient appeared in no obvious distress secondary to pain upon arrival.  Patient is hypertensive, tachycardic, and tachypneic.  These vitals are likely secondary to distress from pain.  We will continue to closely evaluate as we treat her pain.  We are getting an EKG and troponin to evaluate for any cardiac abnormalities.  Given the large mass and it is unclear if this is cancerous but she is likely at higher risk for blood clots.  We will occur for a PE given her tachypnea and tachycardia.  Extensive labs were drawn including lactic acid.  We will go ahead and get a CT of the abdomen/pelvis with contrast and ultrasound with Dopplers to evaluate her intra-abdominal pathology.  8 mg of morphine have been ordered due to her severe pain (no response to the initial 4 mg so we repeated the 4 mg).  IV fluids have also been initiated.   Differential also includes small bowel obstruction, ovarian torsion, perforation, intra-abdominal abscess, and others.  Amount and/or Complexity of Data Reviewed Labs: ordered. Radiology: ordered.  Risk Prescription drug management.   ***  {Document critical care time when appropriate:1} {Document review of labs and clinical decision tools ie heart score, Chads2Vasc2 etc:1}  {Document your independent review of radiology images, and any outside records:1} {Document your discussion with family members, caretakers, and with consultants:1} {Document social determinants of health affecting pt's care:1} {Document your decision making why or why not admission,  treatments were needed:1} Final Clinical Impression(s) / ED Diagnoses Final diagnoses:  None    Rx / DC Orders ED Discharge Orders     None

## 2022-08-24 NOTE — Op Note (Signed)
Pre-op diagnosis: Peritonitis with free air in the retroperitoneum Postop diagnosis: #1 peritonitis with infection present throughout the abdomen 2.  Retroperitoneal abscess pelvis  Procedure performed: Exploratory laparotomy, extensive lysis of adhesions (over 2 hours) small bowel resection, placement of pelvic drain, placement of ABThera vacuum dressing Surgeon:Mana Haberl K Leolia Vinzant Resident: Dr. Clabe Seal I was personally present during the key and critical portions of this procedure and immediately available throughout the entire procedure, as documented in my operative note. Anesthesia: General endotracheal Indications: This is a 47 year old female with a complex past medical and surgical history who has been followed for several years for a known pelvic mass.  She had an attempted resection in 2022 by Dr. Pricilla Holm but the attempt was aborted due to severe adhesions to surrounding structures.  Biopsy showed that the mass was benign.  Follow-up scan showed that this pelvic mass appeared to be decreasing in size.  In 2023, the patient underwent open incisional hernia repair by Dr. Dossie Der.  She presented to with a 5-day history of slowly worsening pelvic cramping and pain.  The pain acutely worsened today.  She has had nausea and vomiting for the last several days.  She has had some diarrhea.  Today, the pain tracked up her left flank into her chest.  She was evaluated in the emergency department where she was found to have free air in the retroperitoneum of the pelvis extending all the way up into her mediastinum.  Interestingly, she had a normal white blood cell count.  Her lactate was elevated.  Abdominal examination revealed signs of peritonitis.  We recommended immediate exploration.  Operative findings: The patient had purulent fluid throughout her entire abdomen.  The small bowel was dilated and was secondarily inflamed but no sign of small bowel perforation.  The patient had extremely dense  small bowel adhesions down into the pelvis.  There is a large retroperitoneal abscess in the pelvis near the rectosigmoid junction.  The appendix was secondarily inflamed but did not appear to be perforated.  The cecum and transverse colon appeared relatively normal.  The sigmoid colon is secondarily inflamed but there was no obvious sign of diverticulitis.  The abscess is centered behind the rectosigmoid junction but we could not identify an obvious perforation.  The distal sigmoid and the rectum are extremely firm and fixed.  Description of procedure: The patient was brought to the operating room and placed in a supine position on the operating room table.  After an adequate level general anesthesia was obtained, a Foley catheter was placed under sterile technique.  The patient's abdomen was prepped with ChloraPrep and draped in sterile fashion.  A timeout was taken to ensure the proper patient and proper procedure.  The patient has a previous midline incision.  We incised the midline scar and dissected down through the scar tissue to the fascia.  We carefully dissected to the fascia until we encountered her old hernia mesh.  We carefully dissected through the hernia mesh sharply.  We bluntly dissected behind the hernia mesh.  She had a thickened layer of peritoneum.  We grasped a section of peritoneum between hemostats and sharply entered the peritoneal cavity.  Immediately we encountered some foul-smelling purulent fluid.  We used cautery to open the incision water.  The patient has copious amounts of purulent fluid throughout the abdomen.  The omentum was densely adherent to the anterior abdominal wall.  We are able to dissect the omentum away from the abdominal wall using cautery and blunt  dissection.  The small bowel appeared quite dilated but only secondarily thickened.  We identified the terminal ileum in the right lower quadrant.  We ran the small bowel retrograde.  There are some adhesions that were  lysed.  As we worked her way back to the mid jejunum, there are 3 loops of small bowel that are densely adherent down into the pelvis.  The small bowel proximal to this was examined and was relatively free of adhesions.  We worked away all the way back to the ligament of Treitz.  Once we had completed her examination of the small bowel we examined the colon.  The cecum appears normal.  The appendix is identified in the retroperitoneum and is secondary to Flamm but there is no sign of perforation.  The ascending and transverse colon appear relatively normal.  The descending colon has some secondary inflammation but no sign of perforation.  The distal sigmoid is very thickened and extremely firm.  We then spent a considerable amount of time dissecting the 3 loops of small bowel out of the pelvis.  This took a combination of sharp dissection, finger fracture, and cautery.  We were finally able to dissect these loops out of the pelvis.  This revealed a large 10 cm retroperitoneal abscess.  This was foul-smelling which was indicative of stool perforation.  1 section of small bowel has a fairly large serosal injury.  We made the decision to resect this.  We used GIA 75 staplers and the LigaSure device to resect a 10 cm segment of small bowel.  We then packed the small bowel away in the upper abdomen.  We examined the pelvis.  The patient has extremely firm dense adhesions in the pelvis that precluded mobilization of the distal sigmoid and the rectum.  We were able to bluntly dissected around the sides of the rectum and drained some purulent fluid.  We made the decision to resect the appendix as it seem to be densely adherent down to the right adnexa.  We mobilized this with the LigaSure and divided at its base with a GIA 75 stapler.  At this point, the patient was hypotensive and requiring pressors.  Her small bowel became more more distended throughout the course of the procedure.  I made the decision to leave her in  discontinuity and to leave her abdomen open.  She will need to return to the operating room in 24 to 48 hours for reexploration.  We made a small stab incision in the right lower quadrant.  We brought a 46 French drain through this incision and placed down in the retroperitoneal abscess down into the pelvis behind the rectum.  This was secured with 2-0 Ethilon.  We then used the ABThera vacuum dressing to seal the abdomen.  This was placed to 125 mmHg suction.  The patient was left intubated and sedated and was transported to the intensive care unit in critical condition.  All sponge, instrument, and needle counts are correct.  I spoke at length with the patient's husband to relay the events of the surgery, her condition, and the tentative plan for return to the operating room.  We have spoken with critical care medicine who will manage the patient.  Wilmon Arms. Corliss Skains, MD, [facility]  General Surgery   08/24/2022 7:59 PM

## 2022-08-24 NOTE — ED Notes (Signed)
Patient given chlorhexidine cloths and toothbrush prior to being transported to surgery, clear understanding voiced on usage.

## 2022-08-24 NOTE — ED Triage Notes (Addendum)
Pt reports LLQ abd pain pain since Saturday and CP. Reports extensive hx with multiple abd surgeries. Also reports severe back and hip pain. Endorses n/v/d. Clammy at triage.

## 2022-08-24 NOTE — Telephone Encounter (Signed)
Spoke with Apolinar Junes, Brittney Herrera's spouse who called the office stating his wife, Brittney Herrera is having a lot of pain and difficulty breathing with the pain.  Asked to speak with Brittney Herrera, who then came to the phone.   Patient states she has been having increased abdominal pain that started on Saturday. Describes the pain as "contractions" rating 10/10. Patient states is passing gas and tries to have regular bowel movements. Patient reports very little vaginal bleeding but passed a clot on Sunday but can't say the size. Patient states the pain has progressively become worse and she was taking tylenol and ibuprofen and took 1 percocet Saturday night and that was her last percocet and she has no more left. Patient denies any urinary symptoms and also states the pain is radiating to her shoulder and she feels short of breath and has chest pain when taking a deep breath in.   Patient and her husband were advised to go to the ED to get Brittney Herrera evaluated especially if she is having chest pain with shortness or breath. Patient and her husband verbally understood. Advised that message would be relayed to Dr. Pricilla Holm.

## 2022-08-24 NOTE — ED Notes (Signed)
Patient transported to CT 

## 2022-08-24 NOTE — Consult Note (Signed)
NAME:  Brittney Herrera, MRN:  [id number], DOB:  1975-04-14, LOS: 0 ADMISSION DATE:  08/24/2022, CONSULTATION DATE:  08/24/22 REFERRING MD:  CCS CHIEF COMPLAINT:  Shock   History of Present Illness:  Brittney Herrera is 47 year old woman who presented today for abdominal pain found to have free air in the retroperitoneum of the pelvis extending into the mediastinum based on CT imaging. She was taken to the OR for exploratory laparotomy with findings notable for peritonitis, retroperitoneal abscess with a section of small bowel with serosal injury requiring resection. Her appendix was also resected due to dense adhesions to the right adnexa. The small bowel became distended throughout the procedure and she was left in discontinuity. She developed increasing vasopressor needs as well. Her abdomen was left open.  A 19 french drain was placed down in the retroperitoneal abscess space. A abdominal wound vac was put in place. The patient remained intubated and on neosynephrine. She was transferred to the ICU.   PCCM consulted for medical management.   Pertinent  Medical History   Past Medical History:  Diagnosis Date   Arthritis    Bicornuate uterus    Hemorrhoids 11/18/2020   History of abnormal cervical Pap smear    Hypertriglyceridemia 11/18/2020   Hypothyroidism    Rheumatoid aortitis    Thyroid disease    Umbilical hernia 11/28/2020   Significant Hospital Events: Including procedures, antibiotic start and stop dates in addition to other pertinent events   7/2 admitted, OR for ex-lap, area of small bowel resection - left in discontinuity with open abdomen  Interim History / Subjective:  As above  Objective   Blood pressure 121/80, pulse (!) 117, temperature 98.1 F (36.7 C), temperature source Oral, resp. rate 16, height 5\' 3"  (1.6 m), weight 85 kg, SpO2 92 %.        Intake/Output Summary (Last 24 hours) at 08/24/2022 1933 Last data filed at 08/24/2022 1900 Gross per 24 hour  Intake  4288.44 ml  Output 100 ml  Net 4188.44 ml   Filed Weights   08/24/22 1120  Weight: 85 kg    Examination: General: ill appearing woman, intubated, sedated HENT: [place]/AT, ET tube in place, moist mucous membranes Lungs: course breath sounds, no wheezing Cardiovascular: tachycardic, no murmurs Abdomen: distended, wound vac in place, no BS present, JP drain in place Extremities: warm, no edema Neuro: sedated GU: foley in place  Resolved Hospital Problem list     Assessment & Plan:   Septic Shock - continue vasopressor support for MAP 65 or greater - PRN LR boluses and LR 158mL/hr - will place central line and transition neo to levophed - trend lactic acid - Continue zosyn for intra-abdominal coverage - follow up cultures  Pneumoperitoneum s/p ex lap with portion of small bowel resection Retroperineal Pelvic Abscess - management per general surgery - plan for return trips to the OR - Abdomen open with wound vac in place  Post-op Ventilator Management - continue mechanical ventilator supprot - PRN ABGs  - check CXR for ETT placement  Acute Kidney Injury Lactic Acidosis Hyponatremia Hypomagnesemia - in setting of sepsis - Cr improving - monitor UOP - Continue IV fluids as above  Patient to be transferred to [facility] for higher level of care regarding need for return trips to the operating room  Best Practice (right click and "Reselect all SmartList Selections" daily)   Diet/type: NPO DVT prophylaxis: SCD GI prophylaxis: PPI Lines: N/A Foley:  Yes, and it is  still needed Code Status:  full code Last date of multidisciplinary goals of care discussion [updated patient's husband at bedside 7/2]  Labs   CBC: Recent Labs  Lab 08/24/22 1129  WBC 8.2  NEUTROABS 7.3  HGB 15.3*  HCT 44.9  MCV 94.5  PLT 300    Basic Metabolic Panel: Recent Labs  Lab 08/24/22 1129  NA 131*  K 3.1*  CL 95*  CO2 22  GLUCOSE 158*  BUN 20  CREATININE 1.06*  CALCIUM  8.8*   GFR: Estimated Creatinine Clearance: 69.9 mL/min (A) (by C-G formula based on SCr of 1.06 mg/dL (H)). Recent Labs  Lab 08/24/22 1129 08/24/22 1131 08/24/22 1534  WBC 8.2  --   --   LATICACIDVEN  --  2.7* 2.3*    Liver Function Tests: Recent Labs  Lab 08/24/22 1129  AST 31  ALT 31  ALKPHOS 129*  BILITOT 2.0*  PROT 7.4  ALBUMIN 2.9*   Recent Labs  Lab 08/24/22 1129  LIPASE 15   No results for input(s): "AMMONIA" in the last 168 hours.  ABG No results found for: "PHART", "PCO2ART", "PO2ART", "HCO3", "TCO2", "ACIDBASEDEF", "O2SAT"   Coagulation Profile: No results for input(s): "INR", "PROTIME" in the last 168 hours.  Cardiac Enzymes: No results for input(s): "CKTOTAL", "CKMB", "CKMBINDEX", "TROPONINI" in the last 168 hours.  HbA1C: No results found for: "HGBA1C"  CBG: No results for input(s): "GLUCAP" in the last 168 hours.  Review of Systems:   Unable to complete review of systems as patient intubated  Past Medical History:  She,  has a past medical history of Arthritis, Bicornuate uterus, Hemorrhoids (11/18/2020), History of abnormal cervical Pap smear, Hypertriglyceridemia (11/18/2020), Hypothyroidism, Rheumatoid aortitis, Thyroid disease, and Umbilical hernia (11/28/2020).   Surgical History:   Past Surgical History:  Procedure Laterality Date   ABDOMINAL ADVANCEMENT FLAP Bilateral 01/05/2022   Procedure: ABDOMINAL ADVANCEMENT FLAP;  Surgeon: Quentin Ore, MD;  Location: [facility] OR;  Service: General;  Laterality: Bilateral;  Bilateral posterior rectus sheath and right sided transversus abdominis myofascial advancement flaps   ADENOIDECTOMY  02/22/1982   CESAREAN SECTION  04/07/2005   CESAREAN SECTION  09/24/2011   cyst N/A 12/15/2020   2 scalp cysts removed, benign   CYSTOSCOPY W/ RETROGRADES  01/22/2021   Procedure: CYSTOSCOPY WITH RETROGRADE PYELOGRAM;FISTULOGRAM;  Surgeon: Belva Agee, MD;  Location: [facility];  Service: Urology;;    DILATATION & CURETTAGE/HYSTEROSCOPY WITH MYOSURE N/A 01/20/2021   Procedure: DILATATION & CURETTAGE/HYSTEROSCOPY WITH MYOSURE;  Surgeon: Carver Fila, MD;  Location: [facility];  Service: Gynecology;  Laterality: N/A;   DILATION AND CURETTAGE, DIAGNOSTIC / THERAPEUTIC  12/23/2012   FLEXIBLE SIGMOIDOSCOPY N/A 01/22/2021   Procedure: DIAGNOSTIC FLEXIBLE SIGMOIDOSCOPY;  Surgeon: Andria Meuse, MD;  Location: [facility];  Service: General;  Laterality: N/A;   INCISIONAL HERNIA REPAIR N/A 01/05/2022   Procedure: OPEN INCISIONAL HERNIA REPAIR WITH BARD MESH;  Surgeon: Quentin Ore, MD;  Location: [facility] OR;  Service: General;  Laterality: N/A;   INSERTION OF MESH  01/05/2022   Procedure: INSERTION OF MESH;  Surgeon: Quentin Ore, MD;  Location: [facility];  Service: General;;   LAPAROTOMY N/A 01/22/2021   Procedure: EXPLORATORY LAPAROTOMY;  Surgeon: Carver Fila, MD;  Location: [facility];  Service: Gynecology;  Laterality: N/A;   LAPAROTOMY N/A 01/22/2021   Procedure: EXPLORATORY LAPAROTOMY;  Surgeon: Andria Meuse, MD;  Location: [facility];  Service: General;  Laterality: N/A;   MYRINGOTOMY WITH TUBE PLACEMENT  TUBAL LIGATION Bilateral 12/23/2012   WISDOM TOOTH EXTRACTION  02/22/1993   XI ROBOTIC ASSISTED SALPINGECTOMY Right 01/20/2021   Procedure: XI ROBOTIC ASSISTED RIGHT  SALPINGECTOMY, LEFT OVARIAN BIOPSY;  Surgeon: Carver Fila, MD;  Location: [facility];  Service: Gynecology;  Laterality: Right;     Social History:   reports that she quit smoking about 11 years ago. Her smoking use included cigarettes. She has a 5.00 pack-year smoking history. She has never used smokeless tobacco. She reports current alcohol use of about 6.0 standard drinks of alcohol per week. She reports that she does not use drugs.   Family History:  Her family history includes Diabetes in her father; Heart disease in her maternal grandfather, maternal grandmother, and paternal grandmother;  Leukemia in her father; Skin cancer in an other family member. There is no history of Colon cancer, Breast cancer, Ovarian cancer, Endometrial cancer, Prostate cancer, or Pancreatic cancer.   Allergies Allergies  Allergen Reactions   Prednisone Other (See Comments)    Facial swelling       Home Medications  Prior to Admission medications   Medication Sig Start Date End Date Taking? Authorizing Provider  gabapentin (NEURONTIN) 300 MG capsule Take 1 capsule (300 mg total) by mouth 3 (three) times daily. 01/07/22  Yes Stechschulte, Hyman Hopes, MD  ibuprofen (ADVIL) 800 MG tablet Take 1 tablet (800 mg total) by mouth every 8 (eight) hours as needed. 01/07/22  Yes Stechschulte, Hyman Hopes, MD  levothyroxine (SYNTHROID) 150 MCG tablet Take 150 mcg by mouth daily before breakfast.   Yes [provider]  methocarbamol (ROBAXIN-750) 750 MG tablet Take 1 tablet (750 mg total) by mouth 4 (four) times daily. 01/07/22   Stechschulte, Hyman Hopes, MD  oxyCODONE-acetaminophen (PERCOCET) 5-325 MG tablet Take 1 tablet by mouth every 4 (four) hours as needed for severe pain. 01/07/22 01/07/23  Stechschulte, Hyman Hopes, MD     Critical care time: 60 minutes    Melody Comas, MD [facility] Office: [phone]   See Amion for personal pager PCCM on call pager [phone] until 7pm. Please call Elink 7p-7a. [phone]

## 2022-08-25 ENCOUNTER — Inpatient Hospital Stay (HOSPITAL_COMMUNITY): Payer: BC Managed Care – PPO

## 2022-08-25 ENCOUNTER — Encounter (HOSPITAL_COMMUNITY): Payer: Self-pay | Admitting: Surgery

## 2022-08-25 DIAGNOSIS — R6521 Severe sepsis with septic shock: Secondary | ICD-10-CM | POA: Diagnosis not present

## 2022-08-25 DIAGNOSIS — A419 Sepsis, unspecified organism: Secondary | ICD-10-CM | POA: Diagnosis not present

## 2022-08-25 DIAGNOSIS — K668 Other specified disorders of peritoneum: Secondary | ICD-10-CM | POA: Diagnosis not present

## 2022-08-25 DIAGNOSIS — K631 Perforation of intestine (nontraumatic): Principal | ICD-10-CM

## 2022-08-25 DIAGNOSIS — R579 Shock, unspecified: Secondary | ICD-10-CM

## 2022-08-25 LAB — CBC WITH DIFFERENTIAL/PLATELET
Abs Immature Granulocytes: 0 10*3/uL (ref 0.00–0.07)
Abs Immature Granulocytes: 0.06 10*3/uL (ref 0.00–0.07)
Basophils Absolute: 0 10*3/uL (ref 0.0–0.1)
Basophils Relative: 0 %
Basophils Relative: 0 %
Eosinophils Absolute: 0 10*3/uL (ref 0.0–0.5)
Eosinophils Absolute: 0.1 10*3/uL (ref 0.0–0.5)
Eosinophils Relative: 1 %
HCT: 42.7 % (ref 36.0–46.0)
HCT: 42.5 % (ref 36.0–46.0)
Hemoglobin: 13.2 g/dL (ref 12.0–15.0)
Hemoglobin: 13.4 g/dL (ref 12.0–15.0)
Immature Granulocytes: 0 %
Immature Granulocytes: 0 %
Lymphocytes Relative: 6 %
Lymphs Abs: 0.4 10*3/uL — ABNORMAL LOW (ref 0.7–4.0)
Lymphs Abs: 0.9 10*3/uL (ref 0.7–4.0)
MCH: 32.5 pg (ref 26.0–34.0)
MCH: 31.8 pg (ref 26.0–34.0)
MCHC: 30.9 g/dL (ref 30.0–36.0)
MCHC: 31.5 g/dL (ref 30.0–36.0)
MCV: 105.2 fL — ABNORMAL HIGH (ref 80.0–100.0)
MCV: 100.7 fL — ABNORMAL HIGH (ref 80.0–100.0)
Monocytes Absolute: 1.2 10*3/uL — ABNORMAL HIGH (ref 0.1–1.0)
Monocytes Relative: 8 %
Monocytes Relative: 8 %
Neutro Abs: 5.1 10*3/uL (ref 1.7–7.7)
Neutro Abs: 12.4 10*3/uL — ABNORMAL HIGH (ref 1.7–7.7)
Neutrophils Relative %: 86 %
Neutrophils Relative %: 85 %
Platelets: 317 10*3/uL (ref 150–400)
Platelets: 397 10*3/uL (ref 150–400)
RBC: 4.06 MIL/uL (ref 3.87–5.11)
RBC: 4.22 MIL/uL (ref 3.87–5.11)
RDW: 14.9 % (ref 11.5–15.5)
RDW: 14.9 % (ref 11.5–15.5)
WBC: 5.9 10*3/uL (ref 4.0–10.5)
WBC: 14.7 10*3/uL — ABNORMAL HIGH (ref 4.0–10.5)
nRBC: 0 % (ref 0.0–0.2)
nRBC: 0 % (ref 0.0–0.2)

## 2022-08-25 LAB — COMPREHENSIVE METABOLIC PANEL
ALT: 20 U/L (ref 0–44)
AST: 25 U/L (ref 15–41)
Albumin: 2 g/dL — ABNORMAL LOW (ref 3.5–5.0)
Alkaline Phosphatase: 46 U/L (ref 38–126)
Anion gap: 12 (ref 5–15)
BUN: 18 mg/dL (ref 6–20)
CO2: 19 mmol/L — ABNORMAL LOW (ref 22–32)
Calcium: 7.3 mg/dL — ABNORMAL LOW (ref 8.9–10.3)
Chloride: 99 mmol/L (ref 98–111)
Creatinine, Ser: 1.66 mg/dL — ABNORMAL HIGH (ref 0.44–1.00)
GFR, Estimated: 39 mL/min — ABNORMAL LOW (ref >60)
Glucose, Bld: 178 mg/dL — ABNORMAL HIGH (ref 70–99)
Potassium: 3.8 mmol/L (ref 3.5–5.1)
Sodium: 130 mmol/L — ABNORMAL LOW (ref 135–145)
Total Bilirubin: 2.1 mg/dL — ABNORMAL HIGH (ref 0.3–1.2)
Total Protein: 5.1 g/dL — ABNORMAL LOW (ref 6.5–8.1)

## 2022-08-25 LAB — BLOOD CULTURE ID PANEL (REFLEXED) - BCID2

## 2022-08-25 LAB — POCT I-STAT 7, (LYTES, BLD GAS, ICA,H+H)
Acid-base deficit: 7 mmol/L — ABNORMAL HIGH (ref 0.0–2.0)
Acid-base deficit: 6 mmol/L — ABNORMAL HIGH (ref 0.0–2.0)
Acid-base deficit: 6 mmol/L — ABNORMAL HIGH (ref 0.0–2.0)
Bicarbonate: 22.5 mmol/L (ref 20.0–28.0)
Bicarbonate: 22.3 mmol/L (ref 20.0–28.0)
Bicarbonate: 21.3 mmol/L (ref 20.0–28.0)
Calcium, Ion: 1.01 mmol/L — ABNORMAL LOW (ref 1.15–1.40)
Calcium, Ion: 0.99 mmol/L — ABNORMAL LOW (ref 1.15–1.40)
Calcium, Ion: 1.04 mmol/L — ABNORMAL LOW (ref 1.15–1.40)
HCT: 40 % (ref 36.0–46.0)
HCT: 38 % (ref 36.0–46.0)
HCT: 35 % — ABNORMAL LOW (ref 36.0–46.0)
Hemoglobin: 13.6 g/dL (ref 12.0–15.0)
Hemoglobin: 12.9 g/dL (ref 12.0–15.0)
Hemoglobin: 11.9 g/dL — ABNORMAL LOW (ref 12.0–15.0)
O2 Saturation: 96 %
O2 Saturation: 95 %
O2 Saturation: 99 %
Patient temperature: 98.6
Patient temperature: 37.4
Patient temperature: 37.1
Potassium: 4.1 mmol/L (ref 3.5–5.1)
Potassium: 4 mmol/L (ref 3.5–5.1)
Potassium: 3.9 mmol/L (ref 3.5–5.1)
Sodium: 130 mmol/L — ABNORMAL LOW (ref 135–145)
Sodium: 130 mmol/L — ABNORMAL LOW (ref 135–145)
Sodium: 129 mmol/L — ABNORMAL LOW (ref 135–145)
TCO2: 24 mmol/L (ref 22–32)
TCO2: 24 mmol/L (ref 22–32)
TCO2: 23 mmol/L (ref 22–32)
pCO2 arterial: 62.3 mmHg — ABNORMAL HIGH (ref 32–48)
pCO2 arterial: 58.2 mmHg — ABNORMAL HIGH (ref 32–48)
pCO2 arterial: 48.2 mmHg — ABNORMAL HIGH (ref 32–48)
pH, Arterial: 7.166 — CL (ref 7.35–7.45)
pH, Arterial: 7.193 — CL (ref 7.35–7.45)
pH, Arterial: 7.255 — ABNORMAL LOW (ref 7.35–7.45)
pO2, Arterial: 104 mmHg (ref 83–108)
pO2, Arterial: 99 mmHg (ref 83–108)
pO2, Arterial: 140 mmHg — ABNORMAL HIGH (ref 83–108)

## 2022-08-25 LAB — LACTIC ACID, PLASMA
Lactic Acid, Venous: 3.1 mmol/L (ref 0.5–1.9)
Lactic Acid, Venous: 3 mmol/L (ref 0.5–1.9)
Lactic Acid, Venous: 2.6 mmol/L (ref 0.5–1.9)

## 2022-08-25 LAB — MRSA NEXT GEN BY PCR, NASAL: MRSA by PCR Next Gen: NOT DETECTED

## 2022-08-25 LAB — PHOSPHORUS: Phosphorus: 5.3 mg/dL — ABNORMAL HIGH (ref 2.5–4.6)

## 2022-08-25 LAB — LIPASE, BLOOD: Lipase: 15 U/L (ref 11–51)

## 2022-08-25 LAB — TRIGLYCERIDES: Triglycerides: 377 mg/dL — ABNORMAL HIGH (ref <150)

## 2022-08-25 LAB — MAGNESIUM: Magnesium: 2.3 mg/dL (ref 1.7–2.4)

## 2022-08-25 MED ORDER — NOREPINEPHRINE 4 MG/250ML-% IV SOLN
0.0000 ug/min | INTRAVENOUS | Status: DC
  Administered 2022-08-25: 15 ug/min via INTRAVENOUS
  Administered 2022-08-25: 11 ug/min via INTRAVENOUS
  Administered 2022-08-25: 10 ug/min via INTRAVENOUS
  Administered 2022-08-25: 8 ug/min via INTRAVENOUS
  Administered 2022-08-25: 15 ug/min via INTRAVENOUS
  Administered 2022-08-26: 5 ug/min via INTRAVENOUS
  Administered 2022-08-26: 3 ug/min via INTRAVENOUS
  Administered 2022-08-27: 2 ug/min via INTRAVENOUS
  Filled 2022-08-25 (×8): qty 250

## 2022-08-25 MED ORDER — ALBUMIN HUMAN 5 % IV SOLN
25.0000 g | Freq: Once | INTRAVENOUS | Status: AC
  Administered 2022-08-25: 25 g via INTRAVENOUS
  Filled 2022-08-25: qty 500

## 2022-08-25 MED ORDER — LEVOTHYROXINE SODIUM 100 MCG/5ML IV SOLN
100.0000 ug | Freq: Every day | INTRAVENOUS | Status: DC
  Administered 2022-08-26 – 2022-09-08 (×14): 100 ug via INTRAVENOUS
  Filled 2022-08-25 (×16): qty 5

## 2022-08-25 MED ORDER — MUPIROCIN 2 % EX OINT
1.0000 | TOPICAL_OINTMENT | Freq: Two times a day (BID) | CUTANEOUS | Status: DC
  Administered 2022-08-25 – 2022-08-26 (×2): 1 via NASAL
  Filled 2022-08-25: qty 22

## 2022-08-25 MED ORDER — LACTATED RINGERS IV BOLUS
500.0000 mL | Freq: Once | INTRAVENOUS | Status: AC
  Administered 2022-08-25: 500 mL via INTRAVENOUS

## 2022-08-25 MED ORDER — CALCIUM GLUCONATE-NACL 2-0.675 GM/100ML-% IV SOLN
2.0000 g | Freq: Once | INTRAVENOUS | Status: AC
  Administered 2022-08-25: 2000 mg via INTRAVENOUS
  Filled 2022-08-25: qty 100

## 2022-08-25 MED ORDER — MIDAZOLAM HCL 2 MG/2ML IJ SOLN
2.0000 mg | INTRAMUSCULAR | Status: DC | PRN
  Administered 2022-08-25 – 2022-08-27 (×2): 2 mg via INTRAVENOUS
  Filled 2022-08-25 (×2): qty 2

## 2022-08-25 MED ORDER — LEVOTHYROXINE SODIUM 100 MCG/5ML IV SOLN: 75.0000 ug | Freq: Every day | INTRAVENOUS | Status: DC

## 2022-08-25 MED ORDER — MAGNESIUM SULFATE 2 GM/50ML IV SOLN
2.0000 g | Freq: Once | INTRAVENOUS | Status: AC
  Administered 2022-08-25: 2 g via INTRAVENOUS
  Filled 2022-08-25: qty 50

## 2022-08-25 NOTE — Progress Notes (Signed)
1 Day Post-Op  Subjective: Sedated on vent with open abdomen.  Not making much urine.  Only 75cc overnight.  Only 20cc so far this am.  NGT with minimal output.  On levo/neo  ROS: See above, otherwise other systems negative  Objective: Vital signs in last 24 hours: Temp:  [97.6 F (36.4 C)-98.6 F (37 C)] 98.6 F (37 C) (07/03 0800) Pulse Rate:  [82-117] 95 (07/03 0815) Resp:  [9-27] 18 (07/03 0815) BP: (72-153)/(48-101) 98/52 (07/03 0800) SpO2:  [91 %-100 %] 94 % (07/03 0815) Arterial Line BP: (94-122)/(47-69) 109/58 (07/03 0815) FiO2 (%):  [50 %-60 %] 50 % (07/03 0747) Weight:  [85 kg] 85 kg (07/02 1120) Last BM Date :  (PTA)  Intake/Output from previous day: 07/02 0701 - 07/03 0700 In: 8840.8 [I.V.:4300.6; IV Piggyback:4540.2] Out: 870 [Urine:220; Drains:550; Blood:100] Intake/Output this shift: Total I/O In: 291.4 [I.V.:278.9; IV Piggyback:12.5] Out: 145 [Urine:20; Emesis/NG output:50; Drains:75]  PE: Gen: critically ill on vent Heart: regular Lungs: CTAB, on vent, ETT in place Abd: distended, vac in place and abdomen open, slightly cloudy serosang output noted (350cc), JP drain with mostly serous output (200cc).  NGT in place with minimal output.  Multiple flushings to help get this working better. GU: minimal clear yellow urine present in foley Ext: no significant edema at this point  Lab Results:  Recent Labs    08/24/22 2137 08/25/22 0416  WBC 5.9 14.7*  HGB 13.2 13.4  HCT 42.7 42.5  PLT 317 397   BMET Recent Labs    08/24/22 2137 08/25/22 0416  NA 130* 130*  K 3.7 3.8  CL 99 99  CO2 20* 19*  GLUCOSE 159* 178*  BUN 17 18  CREATININE 0.93 1.66*  CALCIUM 7.3* 7.3*   PT/INR No results for input(s): "LABPROT", "INR" in the last 72 hours. CMP     Component Value Date/Time   NA 130 (L) 08/25/2022 0416   K 3.8 08/25/2022 0416   CL 99 08/25/2022 0416   CO2 19 (L) 08/25/2022 0416   GLUCOSE 178 (H) 08/25/2022 0416   BUN 18 08/25/2022 0416    CREATININE 1.66 (H) 08/25/2022 0416   CALCIUM 7.3 (L) 08/25/2022 0416   PROT 5.1 (L) 08/25/2022 0416   ALBUMIN 2.0 (L) 08/25/2022 0416   AST 25 08/25/2022 0416   ALT 20 08/25/2022 0416   ALKPHOS 46 08/25/2022 0416   BILITOT 2.1 (H) 08/25/2022 0416   GFRNONAA 39 (L) 08/25/2022 0416   Lipase     Component Value Date/Time   LIPASE 15 08/25/2022 0416       Studies/Results: DG Abd 1 View  Result Date: 08/24/2022 CLINICAL DATA:  Check gastric catheter placement EXAM: ABDOMEN - 1 VIEW COMPARISON:  None Available. FINDINGS: Gastric catheter is coiled within the stomach. No obstructive changes are seen. Contrast material is noted within the kidneys from recent CT examination. No obstructive changes are noted. IMPRESSION: Gastric catheter within the stomach. Electronically Signed   By: Alcide Clever M.D.   On: 08/24/2022 21:41   CT ABDOMEN PELVIS W CONTRAST  Result Date: 08/24/2022 CLINICAL DATA:  Abdominal pain, pain left lower quadrant EXAM: CT ABDOMEN AND PELVIS WITH CONTRAST TECHNIQUE: Multidetector CT imaging of the abdomen and pelvis was performed using the standard protocol following bolus administration of intravenous contrast. RADIATION DOSE REDUCTION: This exam was performed according to the departmental dose-optimization program which includes automated exposure control, adjustment of the mA and/or kV according to patient size and/or use of  iterative reconstruction technique. CONTRAST:  OMNIPAQUE IOHEXOL 350 MG/ML SOLN COMPARISON:  None Available. FINDINGS: Lower chest: There are patchy infiltrates in both lower lung fields suggesting atelectasis/pneumonia. Hepatobiliary: Liver is enlarged measuring 24.8 cm in length. The decreased density in liver in comparison to the spleen suggesting fatty infiltration. There is no dilation of bile ducts. Gallbladder is unremarkable. Pancreas: No focal abnormalities are seen. Spleen: Unremarkable. Adrenals/Urinary Tract: Adrenals are unremarkable.  There is no hydronephrosis. There is 4 mm calculus in the upper midportion of left kidney. There are few low-density foci in renal cortex measuring up to 10 mm suggesting renal cysts. Ureters are not dilated. Urinary bladder is not distended. There are no pockets of air in the lumen of the bladder. Stomach/Bowel: Stomach is unremarkable. There is no dilation of small bowel loops. Appendix is not seen. Scattered diverticula seen in colon. There is diffuse wall thickening in sigmoid colon. Large amount of free air is noted in retroperitoneum extending into the mesentery. Pneumoperitoneum is seen. Vascular/Lymphatic: Vascular structures are unremarkable. There are slightly enlarged lymph nodes in retroperitoneum and mesentery. Reproductive: Uterus is displaced anteriorly. Endometrial cavity in the fundus appears be divided suggesting possible septate uterus. Other: There are multiple pockets of air in retroperitoneum and pockets of pneumoperitoneum. There is small amount of fluid in the mesentery. Small ascites is present. Musculoskeletal: No acute findings are seen. IMPRESSION: Large amount of air is seen in the retroperitoneum. There is pneumoperitoneum. Findings suggest bowel perforation, possibly in sigmoid colon. There is diffuse wall thickening in sigmoid colon suggesting colitis. Less likely possibility would be acute diverticulitis. Multiple diverticula are seen in colon. There is no loculated pericolic fluid collection. When the patient's clinical condition permits, endoscopy should be considered to rule out any underlying neoplastic process. There is no evidence of small-bowel obstruction. There is no hydronephrosis. Enlarged fatty liver. Left renal calculus. Few renal cysts. Patchy infiltrates in the lower lung fields may suggest atelectasis/pneumonia. Imaging findings were relayed to patient's provider Dr. Hattie Perch by telephone call at 2:38 p.m. on 08/24/2022. Electronically Signed   By: Ernie Avena  M.D.   On: 08/24/2022 14:42   CT Angio Chest PE W and/or Wo Contrast  Result Date: 08/24/2022 CLINICAL DATA:  Chest pain, abdominal pain, back pain EXAM: CT ANGIOGRAPHY CHEST WITH CONTRAST TECHNIQUE: Multidetector CT imaging of the chest was performed using the standard protocol during bolus administration of intravenous contrast. Multiplanar CT image reconstructions and MIPs were obtained to evaluate the vascular anatomy. RADIATION DOSE REDUCTION: This exam was performed according to the departmental dose-optimization program which includes automated exposure control, adjustment of the mA and/or kV according to patient size and/or use of iterative reconstruction technique. CONTRAST:  OMNIPAQUE IOHEXOL 350 MG/ML SOLN COMPARISON:  None Available. FINDINGS: Cardiovascular: There is homogeneous enhancement in thoracic aorta. Ascending thoracic aorta measures 3.4 cm. There are no intraluminal filling defects in central pulmonary artery branches. Infiltrates and motion artifacts limit evaluation of small peripheral pulmonary artery branches. Mediastinum/Nodes: Pneumomediastinum is seen extending into the lower neck. Pneumoperitoneum is noted in upper abdomen. Lungs/Pleura: There are patchy infiltrates in both lower lung fields. Faint ground-glass densities are seen in both lungs. There is no pleural effusion or pneumothorax. Upper Abdomen: There is fatty infiltration in liver. There is pneumoperitoneum. There are pockets of air in retroperitoneum. Musculoskeletal: There is decrease in height of the bodies of T2 and T6 vertebrae with no break in the cortical margins. Schmorl's nodes are seen in the upper endplates of  bodies of T10 and T11 vertebrae. Review of the MIP images confirms the above findings. IMPRESSION: There is no evidence of central pulmonary artery embolism. There is no evidence of thoracic aortic dissection. There are patchy infiltrates in both lower lung fields suggesting atelectasis/pneumonia.  There are faint ground-glass densities in both lungs suggesting possible pulmonary edema. Pneumomediastinum extending into the lower neck. There is pneumoperitoneum in the upper abdomen suggesting possible bowel perforation. Fatty liver. Decrease in height of multiple thoracic vertebral bodies may be residual from previous injury. Electronically Signed   By: Ernie Avena M.D.   On: 08/24/2022 14:11   US PELVIC COMPLETE WITH TRANSVAGINAL  Result Date: 08/24/2022 CLINICAL DATA:  Abdominal pain EXAM: TRANSABDOMINAL AND TRANSVAGINAL ULTRASOUND OF PELVIS TECHNIQUE: Both transabdominal and transvaginal ultrasound examinations of the pelvis were performed. Transabdominal technique was performed for global imaging of the pelvis including uterus, ovaries, adnexal regions, and pelvic cul-de-sac. It was necessary to proceed with endovaginal exam following the transabdominal exam to visualize the adnexa and endometrium. Of note as per the sonographer the patient had difficulty tolerating the procedure including the endovaginal portion of the exam COMPARISON:  MRI 11/13/2021 FINDINGS: Uterus Measurements: 10.1 x 4.7 x 6.4 cm = volume: 158.0 mL. Poorly visualized. No obvious mass lesion. On prior MRI there was evidence of a septated uterus and areas of adenomyosis. Endometrium Thickness: 5 mm.  No focal abnormality visualized. Ovaries Not well seen today Other findings No abnormal free fluid. Transabdominal images are also limited due to contracted urinary bladder and overlapping bowel gas and soft tissue. Poor sonographic window. IMPRESSION: Very limited study. Ovaries are not well seen. No separate adnexal mass or fluid. Additional cross-sectional imaging workup as clinically appropriate Electronically Signed   By: Karen Kays M.D.   On: 08/24/2022 12:55    Anti-infectives: Anti-infectives (From admission, onward)    Start     Dose/Rate Route Frequency Ordered Stop   08/24/22 2200  piperacillin-tazobactam  (ZOSYN) IVPB 3.375 g        3.375 g 12.5 mL/hr over 240 Minutes Intravenous Every 8 hours 08/24/22 1635 08/31/22 2159   08/24/22 1430  piperacillin-tazobactam (ZOSYN) IVPB 3.375 g        3.375 g 100 mL/hr over 30 Minutes Intravenous  Once 08/24/22 1423 08/24/22 1608        Assessment/Plan POD 1, s/p ex lap with extensive LOA, SBR in discontinuity, placement of pelvic drain, placement of ABThera vacuum dressing, Dr. Corliss Skains 7/2 for Pneumoperitoneum, retroperitoneal air, mediastinal air likely secondary to perforated rectosigmoid colon with history of pelvic mass  -abdomen is open, keep sedated on vent -will return to OR tomorrow for re-anastomosis, colostomy, and possible closure vs just washout if unable to complete the above.  I have called and spoken to her husband, Apolinar Junes, for an update and to inform him of this plan.  He is agreeable to proceed -cont abx therapy -cont JP drain -daily labs -NGT in place and initially note working well.  This has been adjusted.  Minimal output -will likely need to consider early nutrition intervention with TNA   FEN - NPO/NGT/IVFs/pressors/bowel discontinuity VTE - lovenox ID - zosyn  AKI - continue fluid resuscitation, daily labs Septic shock - secondary to above, appreciate CCM assistance Post op respiratory failure - remain sedated on vent due to open abdomen, appreciate CCM assistance Hypokalemia - continue fluids, monitor Hyponatremia - improved Hypothyroidism - IV synthroid daily Anxiety  RA   LOS: 1 day    Tresa Endo  Justin Mend , [facility] 08/25/2022, 8:41 AM Please see Amion for pager number during day hours 7:00am-4:30pm or 7:00am -11:30am on weekends

## 2022-08-25 NOTE — Anesthesia Preprocedure Evaluation (Signed)
Anesthesia Evaluation  Patient identified by MRN, date of birth, ID band Patient unresponsive    Reviewed: Allergy & Precautions, H&P , NPO status , Patient's Chart, lab work & pertinent test results  Airway Mallampati: Intubated       Dental no notable dental hx. (+) Teeth Intact, Dental Advisory Given   Pulmonary neg pulmonary ROS, former smoker   Pulmonary exam normal breath sounds clear to auscultation       Cardiovascular Exercise Tolerance: Good negative cardio ROS  Rhythm:Regular Rate:Normal     Neuro/Psych negative neurological ROS  negative psych ROS   GI/Hepatic negative GI ROS, Neg liver ROS,,,  Endo/Other  Hypothyroidism    Renal/GU negative Renal ROS  negative genitourinary   Musculoskeletal  (+) Arthritis ,    Abdominal   Peds  Hematology negative hematology ROS (+)   Anesthesia Other Findings   Reproductive/Obstetrics negative OB ROS                             Anesthesia Physical Anesthesia Plan  ASA: 4  Anesthesia Plan: General   Post-op Pain Management:    Induction: Intravenous  PONV Risk Score and Plan: 4 or greater and Ondansetron, Dexamethasone and Midazolam  Airway Management Planned: Oral ETT  Additional Equipment: Arterial line  Intra-op Plan:   Post-operative Plan: Post-operative intubation/ventilation  Informed Consent: I have reviewed the patients History and Physical, chart, labs and discussed the procedure including the risks, benefits and alternatives for the proposed anesthesia with the patient or authorized representative who has indicated his/her understanding and acceptance.     Dental advisory given  Plan Discussed with: CRNA  Anesthesia Plan Comments:        Anesthesia Quick Evaluation

## 2022-08-25 NOTE — Progress Notes (Addendum)
PHARMACY - PHYSICIAN COMMUNICATION CRITICAL VALUE ALERT - BLOOD CULTURE IDENTIFICATION (BCID)  Brittney Herrera is an 47 y.o. female who presented to [facility] on 08/24/2022 with a chief complaint of retroperitoneal abscess/peritonitis.   Assessment: 47 year old female admitted for retroperitoneal abscess/perforated colon. She is s/p ex lap 7/2 with placement of pelvic drain. Blood cultures now with 1/4 staph species which is likely a contaminant.   Name of physician (or Provider) Contacted: 4N PharmD/CCM  Current antibiotics: Zosyn   Changes to prescribed antibiotics recommended:  No changes based on blood culture Zosyn continued for intra-abdominal coverage   Results for orders placed or performed during the hospital encounter of 08/24/22  Blood Culture ID Panel (Reflexed) (Collected: 08/24/2022 12:38 PM)  Result Value Ref Range   Enterococcus faecalis NOT DETECTED NOT DETECTED   Enterococcus Faecium NOT DETECTED NOT DETECTED   Listeria monocytogenes NOT DETECTED NOT DETECTED   Staphylococcus species DETECTED (A) NOT DETECTED   Staphylococcus aureus (BCID) NOT DETECTED NOT DETECTED   Staphylococcus epidermidis NOT DETECTED NOT DETECTED   Staphylococcus lugdunensis NOT DETECTED NOT DETECTED   Streptococcus species NOT DETECTED NOT DETECTED   Streptococcus agalactiae NOT DETECTED NOT DETECTED   Streptococcus pneumoniae NOT DETECTED NOT DETECTED   Streptococcus pyogenes NOT DETECTED NOT DETECTED   A.calcoaceticus-baumannii NOT DETECTED NOT DETECTED   Bacteroides fragilis NOT DETECTED NOT DETECTED   Enterobacterales NOT DETECTED NOT DETECTED   Enterobacter cloacae complex NOT DETECTED NOT DETECTED   Escherichia coli NOT DETECTED NOT DETECTED   Klebsiella aerogenes NOT DETECTED NOT DETECTED   Klebsiella oxytoca NOT DETECTED NOT DETECTED   Klebsiella pneumoniae NOT DETECTED NOT DETECTED   Proteus species NOT DETECTED NOT DETECTED   Salmonella species NOT DETECTED NOT DETECTED    Serratia marcescens NOT DETECTED NOT DETECTED   Haemophilus influenzae NOT DETECTED NOT DETECTED   Neisseria meningitidis NOT DETECTED NOT DETECTED   Pseudomonas aeruginosa NOT DETECTED NOT DETECTED   Stenotrophomonas maltophilia NOT DETECTED NOT DETECTED   Candida albicans NOT DETECTED NOT DETECTED   Candida auris NOT DETECTED NOT DETECTED   Candida glabrata NOT DETECTED NOT DETECTED   Candida krusei NOT DETECTED NOT DETECTED   Candida parapsilosis NOT DETECTED NOT DETECTED   Candida tropicalis NOT DETECTED NOT DETECTED   Cryptococcus neoformans/gattii NOT DETECTED NOT DETECTED    [name], PharmD, BCPS, BCIDP Infectious Diseases Clinical Pharmacist Phone: [phone] 08/25/2022  10:56 AM

## 2022-08-25 NOTE — Progress Notes (Signed)
Initial Nutrition Assessment  DOCUMENTATION CODES:   Obesity unspecified  INTERVENTION:  Recommend TPN initiation to meet nutrition needs insetting of discontinuity   NUTRITION DIAGNOSIS:   Inadequate oral intake related to acute illness as evidenced by NPO status.  GOAL:   Patient will meet greater than or equal to 90% of their needs  MONITOR:   Vent status, Labs, Weight trends, I & O's  REASON FOR ASSESSMENT:   Ventilator    ASSESSMENT:   Pt admitted with pneumoperitoneum. PMH significant for pelvic mass, s/p attempted resection (2022), and open incisional hernia repair (2023).   7/2 - admitted; s/p exlap, small bowel resection, left in discontinuity with open abdomen  Patient is currently intubated on ventilator support MV: 10.7 L/min Temp (24hrs), Avg:98.9 F (37.2 C), Min:97.6 F (36.4 C), Max:99.5 F (37.5 C) Propofol: 25.5 ml/hr (673kcal per day)  Spoke with pt's husband at bedside. He reports that pt was eating well up until the night prior to admission. She typically follows a pescatarian diet however is moving more toward a vegetarian diet. She has followed this since she was a child. She does not regularly take vitamins or protein supplements.   Admit weight: 85 kg  Medications: protonix Drips: Fentanyl Levo @ 67mcg/min Zosyn  Labs: sodium 130, Cr 1.66, phos 5.3, GFR 39, TG's 377  R abdomen JP drain: x12 hours Abdominal wound VAC: x12 hours  NUTRITION - FOCUSED PHYSICAL EXAM:  Flowsheet Row Most Recent Value  Orbital Region No depletion  Upper Arm Region No depletion  Thoracic and Lumbar Region No depletion  Buccal Region Unable to assess  Temple Region No depletion  Clavicle Bone Region No depletion  Clavicle and Acromion Bone Region No depletion  Scapular Bone Region No depletion  Dorsal Hand No depletion  Patellar Region No depletion  Anterior Thigh Region No depletion  Posterior Calf Region No depletion  Edema (RD  Assessment) Mild  [generalized]  Hair Reviewed  Eyes Unable to assess  Mouth Unable to assess  Skin Reviewed  Nails Reviewed       Diet Order:   Diet Order             Diet NPO time specified  Diet effective now                   EDUCATION NEEDS:   No education needs have been identified at this time  Skin:  Skin Assessment: Skin Integrity Issues: Skin Integrity Issues:: Wound VAC Wound Vac: abdomen  Last BM:  PTA  Height:   Ht Readings from Last 1 Encounters:  08/24/22 5\' 3"  (1.6 m)    Weight:   Wt Readings from Last 1 Encounters:  08/24/22 85 kg    Ideal Body Weight:  52.3 kg  BMI:  Body mass index is 33.19 kg/m.  Estimated Nutritional Needs:   Kcal:  1900-2100  Protein:  105-125g  Fluid:  >/=1.9L  Brittney Herrera, RDN, LDN Clinical Nutrition

## 2022-08-25 NOTE — Progress Notes (Addendum)
eLink Physician-Brief Progress Note Patient Name: Brittney Herrera DOB: 13-Aug-1975 MRN: [id number]   Date of Service  08/25/2022  HPI/Events of Note  Patient is oliguric and has not responded to a fluid bolus.  eICU Interventions  Will check a BNP to try to gauge volume status. Will also bladder scan the patient to exclude urinary retention.        Genaro Bekker U Rochelle Nephew 08/25/2022, 6:18 AM

## 2022-08-25 NOTE — Procedures (Signed)
Central Venous Catheter Insertion Procedure Note  Brittney Herrera  [id number]  May 11, 1975  Date:08/25/22  Time:3:08 AM   Provider Performing:Kirsten Mckone Celine Mans   Procedure: Insertion of Non-tunneled Central Venous Catheter(36556) with US guidance (65784)   Indication(s) Medication administration and Difficult access  Consent Unable to obtain consent due to emergent nature of procedure.  Anesthesia Topical only with 1% lidocaine   Timeout Verified patient identification, verified procedure, site/side was marked, verified correct patient position, special equipment/implants available, medications/allergies/relevant history reviewed, required imaging and test results available.  Sterile Technique Maximal sterile technique including full sterile barrier drape, hand hygiene, sterile gown, sterile gloves, mask, hair covering, sterile ultrasound probe cover (if used).  Procedure Description Area of catheter insertion was cleaned with chlorhexidine and draped in sterile fashion.  With real-time ultrasound guidance a central venous catheter was placed into the right femoral vein. Nonpulsatile blood flow and easy flushing noted in all ports.  The catheter was sutured in place and sterile dressing applied.  Complications/Tolerance None; patient tolerated the procedure well. Chest X-ray is ordered to verify placement for internal jugular or subclavian cannulation.   Chest x-ray is not ordered for femoral cannulation.  EBL Minimal  Specimen(s) None   Rutherford Guys, PA - [facility] For pager details, please see AMION or use Epic chat  After 1900, please call ELINK for cross coverage needs 08/25/2022, 3:08 AM

## 2022-08-25 NOTE — Progress Notes (Signed)
Attempted to call patient's spouse listed in chart for an update, but call was not able to be connected.

## 2022-08-25 NOTE — Progress Notes (Addendum)
1610 patient turned, eyes opened and hands to ETT. Eyes open to voice multiple times, unable to follow commands, prn fentanyl bolus given 1613. Order obtained for PRN versed. BP drop after this event. Increase in levo, order obtained for albumin. Albumin started 1627, Scanned versed 1634 though pt monitor alarmed for MAP 50 and dropping. Increase in levo, restart neo. MD Agarwala paged and to bedside. BP transiently low, neo turned off, levo now titrating back down. Versed given 1650. Abdomen remains distended though soft, lungs clear/diminished, pupils 3/sluggish, skin warm/clammy.

## 2022-08-25 NOTE — Consult Note (Addendum)
NAME:  Greidis Mountford, MRN:  [id number], DOB:  Sep 29, 1975, LOS: 1 ADMISSION DATE:  08/24/2022, CONSULTATION DATE:  08/24/22 REFERRING MD:  CCS CHIEF COMPLAINT:  Shock   History of Present Illness:  Brittney Herrera is 47 year old woman who presented today for abdominal pain found to have free air in the retroperitoneum of the pelvis extending into the mediastinum based on CT imaging. She was taken to the OR for exploratory laparotomy with findings notable for peritonitis, retroperitoneal abscess with a section of small bowel with serosal injury requiring resection. Her appendix was also resected due to dense adhesions to the right adnexa. The small bowel became distended throughout the procedure and she was left in discontinuity. She developed increasing vasopressor needs as well. Her abdomen was left open.  A 19 french drain was placed down in the retroperitoneal abscess space. A abdominal wound vac was put in place. The patient remained intubated and on neosynephrine. She was transferred to the ICU.   PCCM consulted for medical management.   Pertinent  Medical History   Past Medical History:  Diagnosis Date   Arthritis    Bicornuate uterus    Hemorrhoids 11/18/2020   History of abnormal cervical Pap smear    Hypertriglyceridemia 11/18/2020   Hypothyroidism    Rheumatoid aortitis    Thyroid disease    Umbilical hernia 11/28/2020   Significant Hospital Events: Including procedures, antibiotic start and stop dates in addition to other pertinent events   7/2 admitted, OR for ex-lap, area of small bowel resection - left in discontinuity with open abdomen  Interim History / Subjective:  Remain intubated, on pressors, with open abdomen  Objective   Blood pressure (!) 98/52, pulse 95, temperature 98.6 F (37 C), temperature source Axillary, resp. rate 18, height 5\' 3"  (1.6 m), weight 85 kg, SpO2 94 %.    Vent Mode: PRVC FiO2 (%):  [50 %-60 %] 50 % Set Rate:  [18 bmp] 18 bmp Vt Set:   [420 mL] 420 mL PEEP:  [5 cmH20] 5 cmH20 Plateau Pressure:  [18 cmH20-19 cmH20] 19 cmH20   Intake/Output Summary (Last 24 hours) at 08/25/2022 0911 Last data filed at 08/25/2022 0900 Gross per 24 hour  Intake 9385.24 ml  Output 1085 ml  Net 8300.24 ml    Filed Weights   08/24/22 1120  Weight: 85 kg    Examination:  General: middle aged female on vent critically ill appearing HENT: [place]/AT, ETT, MMM Lungs: Course bilateral breath sounds. No wheeze Cardiovascular: RRR, no MRG Abdomen: Wound vac in place.  Extremities: No acute deformity. Developing some anasarca in the lower extremities.  Neuro: Sedated GU: Foley  Resolved Hospital Problem list     Assessment & Plan:   Septic Shock secondary to peritonitis and retroperineal pelvic abscess - NE and phenylephrine infusion for MAP goal 65 - Plan to wean phenylephrine off and use NE alone.  - IVF to [facility] - Trend lactic acid - Continue zosyn for intra-abdominal coverage - follow up cultures  Pneumoperitoneum s/p ex lap with portion of small bowel resection Retroperineal Pelvic Abscess - Management per general surgery - plan for return trip to the OR 7/4 - Abdomen open with wound vac in place  Post-op Ventilator Management - Continue mechanical ventilator supprot - PRN ABGs  - Propofol and fentanyl infusions for RASS goal -4 to -5 considering open abdomen - Versed PRN add  Acute Kidney Injury Hyponatremia Hypomagnesemia - in setting of sepsis - monitor UOP - Continue  IV fluids as above  Lactic acidosis - LA improving  Nutrition: - low threshold to initiate TPN  Hypothyroid: - synthroid to IV   Best Practice (right click and "Reselect all SmartList Selections" daily)   Diet/type: NPO DVT prophylaxis: SCD GI prophylaxis: PPI Lines: N/A Foley:  Yes, and it is still needed Code Status:  full code Last date of multidisciplinary goals of care discussion [updated patient's husband at bedside 7/2]  Labs    CBC: Recent Labs  Lab 08/24/22 1129 08/24/22 2137 08/25/22 0416  WBC 8.2 5.9 14.7*  NEUTROABS 7.3 5.1 12.4*  HGB 15.3* 13.2 13.4  HCT 44.9 42.7 42.5  MCV 94.5 105.2* 100.7*  PLT 300 317 397     Basic Metabolic Panel: Recent Labs  Lab 08/24/22 1129 08/24/22 2137 08/25/22 0416  NA 131* 130* 130*  K 3.1* 3.7 3.8  CL 95* 99 99  CO2 22 20* 19*  GLUCOSE 158* 159* 178*  BUN 20 17 18   CREATININE 1.06* 0.93 1.66*  CALCIUM 8.8* 7.3* 7.3*  MG  --  1.3*  --   PHOS  --   --  5.3*    GFR: Estimated Creatinine Clearance: 44.7 mL/min (A) (by C-G formula based on SCr of 1.66 mg/dL (H)). Recent Labs  Lab 08/24/22 1129 08/24/22 1131 08/24/22 1534 08/24/22 2127 08/24/22 2137 08/25/22 0413 08/25/22 0416 08/25/22 0712  WBC 8.2  --   --   --  5.9  --  14.7*  --   LATICACIDVEN  --    < > 2.3* 5.0*  --  3.1*  --  3.0*   < > = values in this interval not displayed.     Liver Function Tests: Recent Labs  Lab 08/24/22 1129 08/25/22 0416  AST 31 25  ALT 31 20  ALKPHOS 129* 46  BILITOT 2.0* 2.1*  PROT 7.4 5.1*  ALBUMIN 2.9* 2.0*    Recent Labs  Lab 08/24/22 1129 08/25/22 0416  LIPASE 15 15    No results for input(s): "AMMONIA" in the last 168 hours.  ABG    Component Value Date/Time   PHART 7.33 (L) 08/24/2022 2113   PCO2ART 43 08/24/2022 2113   PO2ART 131 (H) 08/24/2022 2113   HCO3 22.7 08/24/2022 2113   ACIDBASEDEF 3.5 (H) 08/24/2022 2113   O2SAT 100 08/24/2022 2113     Coagulation Profile: No results for input(s): "INR", "PROTIME" in the last 168 hours.  Cardiac Enzymes: No results for input(s): "CKTOTAL", "CKMB", "CKMBINDEX", "TROPONINI" in the last 168 hours.  HbA1C: No results found for: "HGBA1C"  CBG: Recent Labs  Lab 08/24/22 2101 08/24/22 2342  GLUCAP 132* 138*    Review of Systems:   Unable to complete review of systems as patient intubated  Past Medical History:  She,  has a past medical history of Arthritis, Bicornuate uterus,  Hemorrhoids (11/18/2020), History of abnormal cervical Pap smear, Hypertriglyceridemia (11/18/2020), Hypothyroidism, Rheumatoid aortitis, Thyroid disease, and Umbilical hernia (11/28/2020).   Surgical History:   Past Surgical History:  Procedure Laterality Date   ABDOMINAL ADVANCEMENT FLAP Bilateral 01/05/2022   Procedure: ABDOMINAL ADVANCEMENT FLAP;  Surgeon: Quentin Ore, MD;  Location: [facility] OR;  Service: General;  Laterality: Bilateral;  Bilateral posterior rectus sheath and right sided transversus abdominis myofascial advancement flaps   ADENOIDECTOMY  02/22/1982   CESAREAN SECTION  04/07/2005   CESAREAN SECTION  09/24/2011   cyst N/A 12/15/2020   2 scalp cysts removed, benign   CYSTOSCOPY W/ RETROGRADES  01/22/2021  Procedure: CYSTOSCOPY WITH RETROGRADE PYELOGRAM;FISTULOGRAM;  Surgeon: Belva Agee, MD;  Location: [facility];  Service: Urology;;   DILATATION & CURETTAGE/HYSTEROSCOPY WITH MYOSURE N/A 01/20/2021   Procedure: DILATATION & CURETTAGE/HYSTEROSCOPY WITH MYOSURE;  Surgeon: Carver Fila, MD;  Location: [facility];  Service: Gynecology;  Laterality: N/A;   DILATION AND CURETTAGE, DIAGNOSTIC / THERAPEUTIC  12/23/2012   FLEXIBLE SIGMOIDOSCOPY N/A 01/22/2021   Procedure: DIAGNOSTIC FLEXIBLE SIGMOIDOSCOPY;  Surgeon: Andria Meuse, MD;  Location: [facility];  Service: General;  Laterality: N/A;   INCISIONAL HERNIA REPAIR N/A 01/05/2022   Procedure: OPEN INCISIONAL HERNIA REPAIR WITH BARD MESH;  Surgeon: Quentin Ore, MD;  Location: [facility] OR;  Service: General;  Laterality: N/A;   INSERTION OF MESH  01/05/2022   Procedure: INSERTION OF MESH;  Surgeon: Quentin Ore, MD;  Location: [facility];  Service: General;;   LAPAROTOMY N/A 01/22/2021   Procedure: EXPLORATORY LAPAROTOMY;  Surgeon: Carver Fila, MD;  Location: [facility];  Service: Gynecology;  Laterality: N/A;   LAPAROTOMY N/A 01/22/2021   Procedure: EXPLORATORY LAPAROTOMY;  Surgeon: Andria Meuse, MD;   Location: [facility];  Service: General;  Laterality: N/A;   MYRINGOTOMY WITH TUBE PLACEMENT     TUBAL LIGATION Bilateral 12/23/2012   WISDOM TOOTH EXTRACTION  02/22/1993   XI ROBOTIC ASSISTED SALPINGECTOMY Right 01/20/2021   Procedure: XI ROBOTIC ASSISTED RIGHT  SALPINGECTOMY, LEFT OVARIAN BIOPSY;  Surgeon: Carver Fila, MD;  Location: [facility];  Service: Gynecology;  Laterality: Right;     Social History:   reports that she quit smoking about 11 years ago. Her smoking use included cigarettes. She has a 5.00 pack-year smoking history. She has never used smokeless tobacco. She reports current alcohol use of about 6.0 standard drinks of alcohol per week. She reports that she does not use drugs.   Family History:  Her family history includes Diabetes in her father; Heart disease in her maternal grandfather, maternal grandmother, and paternal grandmother; Leukemia in her father; Skin cancer in an other family member. There is no history of Colon cancer, Breast cancer, Ovarian cancer, Endometrial cancer, Prostate cancer, or Pancreatic cancer.   Allergies Allergies  Allergen Reactions   Prednisone Other (See Comments)    Facial swelling       Home Medications  Prior to Admission medications   Medication Sig Start Date End Date Taking? Authorizing Provider  gabapentin (NEURONTIN) 300 MG capsule Take 1 capsule (300 mg total) by mouth 3 (three) times daily. 01/07/22  Yes Stechschulte, Hyman Hopes, MD  ibuprofen (ADVIL) 800 MG tablet Take 1 tablet (800 mg total) by mouth every 8 (eight) hours as needed. 01/07/22  Yes Stechschulte, Hyman Hopes, MD  levothyroxine (SYNTHROID) 150 MCG tablet Take 150 mcg by mouth daily before breakfast.   Yes [provider]  methocarbamol (ROBAXIN-750) 750 MG tablet Take 1 tablet (750 mg total) by mouth 4 (four) times daily. 01/07/22   Stechschulte, Hyman Hopes, MD  oxyCODONE-acetaminophen (PERCOCET) 5-325 MG tablet Take 1 tablet by mouth every 4 (four) hours as needed  for severe pain. 01/07/22 01/07/23  Stechschulte, Hyman Hopes, MD     Critical care time: 43 minutes     Joneen Roach, AGACNP-BC  Pulmonary & Critical Care  See Amion for personal pager PCCM on call pager [phone] until 7pm. Please call Elink 7p-7a. [phone]  08/25/2022 9:21 AM

## 2022-08-25 NOTE — Consult Note (Signed)
GYNECOLOGIC ONCOLOGY INPATIENT CONSULTATION  Date of Service: 08/25/22 Requesting Service: General Surgery Requesting Provider: Barnetta Chapel, PA Consulting Provider: Eugene Garnet, MD   HISTORY OF PRESENT ILLNESS: Brittney Herrera is a 47 y.o. woman who is seen in consultation at the request of Barnetta Chapel, PA for evaluation of septic shock, pneumoperitoneum.  The patient is well-known to me. She initially present with a complex adnexal mass, abnormal uterine bleeding.   Given menstrual changes, the patient underwent pelvic ultrasound in October that showed a 7.4 cm complex adnexal mass that had increased from a greatest diameter of 4 cm in November of last year.  CA-125 was normal, HE4 and Overa both elevated.   On 01/20/2021, the patient was taken to the operating room and underwent hysteroscopy, endometrial sampling using the Myosure under hysteroscopic guidance, diagnostic laparoscopy, robotic right salpingectomy, enterolysis, ovarian mass biopsy, aborted hysterectomy and left salpingo-oophorectomy with operative findings including a bladder fistula with intra-op fistulogram showing likely connection to cervix/uterus and adnexa, along with dense adhesions with the left adnexa to the sigmoid colon and mesentery. Due to the high likelihood of bowel injury with continued surgery and the unanticipated bowel involvement without a bowel prep, the decision was made to stop the procedure and return after adequate bowel prep. On 01/22/2021, the patient returned to the operating room after a bowel prep. Shortly before surgery, her clinical status began deteriorating and there was concern for possible bowel perforation. She underwent diagnostic laparoscopy, exploratory laparotomy with thorough inspection of small and large bowel (with Dr. Cliffton Asters), backfill of bladder, cytoscopy with retrograde pyelogram and attempted fistulogram (with Dr. Benancio Deeds). There was no evidence of bowel perforation,  bladder/ureteral injury at the time of surgery. On 01/23/21, she had a CT scan AP followed by an MRI of the pelvis on 01/24/2021 to further evaluate the pelvis and bladder fistula.   The postoperative course included prophylactic IV cefoxitin and flagyl for 24 hours post-op, foley catheter with no difficulties voiding after removal, superficial abdominal incisional separation with twice daily dressing changes, post-operative ileus requiring NG tube placement on 01/26/2021 with removal the following day in the evening.  She was discharged to home on postoperative day 6 (from 2nd operation).    MRI pelvis on 06/19/21 revealed complex cystic and solid lesion in th left adnexa, slightly decreased in size. 1.8 x 1.6 x 4.3 cm thick walled rim enhancing collection involving posterior bladder wall tracking to the left between posterior bladder wall and anterior uterus toward the left adnexal space. Overall appearance is similar to previous MRI. Endometrial canal is divergent towards the cornua, likely related to arcuate anatomy versus septate uterus. Trace free fluid in the pelvis.    Follow-up MRI pelvis was performed on 11/13/21.  This showed decrease in the ill-defined mixed signal intensity left adnexal mass, now measuring 3.3 x 2.7 x 3.0 cm, decreased from 4.3 x 4.1 x 3.9 cm on 06/11/2021 MRI pelvis. This mass remains densely adherent to the sigmoid colon and left uterine walls. The continued improvement is most suggestive of a largely resolved left tubo-ovarian abscess with residual dense fibrosis.  No discrete bladder fistula seen on scan today and previously visualized thick-walled fluid collection along the outer posterior left bladder wall had resolved.  Persistent chronic prominent wall thickening throughout the distal sigmoid colon and upper rectum, unchanged.  Developmental uterine anomaly, most compatible with septate uterus.  Diffuse uterine adenomyosis noted.  2.9 cm simple appearing right ovarian cyst, no  follow-up recommended.  At this  time the patient was asymptomatic and required to the adnexal mass.  Given no imaging evidence of persistent fistula and decreased size of the mass (highly suggestive of a benign process), decision was made not to pursue large excisional procedure and to proceed with close surveillance.  INTERVAL HISTORY: The patient was seen this morning although was intubated and sedated and not able to provide any history.  From chart review, patient has been to call my office secondary to multiple days of increasing abdominal pain with radiation to her shoulder, shortness of breath, and chest pain.  She was told to present urgently to the emergency department.  Although her white blood cell count was normal on presentation, her lactic acid was elevated and she was found to have an acute abdomen on exam.  CT imaging revealed large free air in the retroperitoneum, pneumoperitoneum.  Diffuse thickening of the sigmoid colon suggestive of colitis.  The patient was taken to the OR by Dr. Corliss Skains with findings of purulent fluid throughout the abdomen, dilated small bowel, and dense adhesions of the small bowel down into the pelvis.  Large retroperitoneal abscess noted near the rectosigmoid junction.  Appendix inflamed but did not appear perforated.  Cecum and transverse colon normal in appearance.  Sigmoid colon was noted to be secondarily inflamed without obvious diverticulitis.  No obvious perforation was noted within the rectosigmoid colon although the distal sigmoid and rectum noted to be quite firm and fixed.  After lysis of adhesion, resection of a short segment of small bowel, appendectomy, the patient became increasingly hypotensive and her small bowel more distended.  A drain was left in the retroperitoneal abscess with the plan for temporary closure with an ABThera and plan to return to the OR once stabilized.  The patient was transferred from [facility] to [facility], ICU overnight.  She  remains intubated and sedated.   PAST MEDICAL HISTORY: Past Medical History:  Diagnosis Date   Arthritis    Bicornuate uterus    Hemorrhoids 11/18/2020   History of abnormal cervical Pap smear    Hypertriglyceridemia 11/18/2020   Hypothyroidism    Rheumatoid aortitis    Thyroid disease    Umbilical hernia 11/28/2020    PAST SURGICAL HISTORY: Past Surgical History:  Procedure Laterality Date   ABDOMINAL ADVANCEMENT FLAP Bilateral 01/05/2022   Procedure: ABDOMINAL ADVANCEMENT FLAP;  Surgeon: Quentin Ore, MD;  Location: [facility] OR;  Service: General;  Laterality: Bilateral;  Bilateral posterior rectus sheath and right sided transversus abdominis myofascial advancement flaps   ADENOIDECTOMY  02/22/1982   CESAREAN SECTION  04/07/2005   CESAREAN SECTION  09/24/2011   cyst N/A 12/15/2020   2 scalp cysts removed, benign   CYSTOSCOPY W/ RETROGRADES  01/22/2021   Procedure: CYSTOSCOPY WITH RETROGRADE PYELOGRAM;FISTULOGRAM;  Surgeon: Belva Agee, MD;  Location: [facility];  Service: Urology;;   DILATATION & CURETTAGE/HYSTEROSCOPY WITH MYOSURE N/A 01/20/2021   Procedure: DILATATION & CURETTAGE/HYSTEROSCOPY WITH MYOSURE;  Surgeon: Carver Fila, MD;  Location: [facility];  Service: Gynecology;  Laterality: N/A;   DILATION AND CURETTAGE, DIAGNOSTIC / THERAPEUTIC  12/23/2012   FLEXIBLE SIGMOIDOSCOPY N/A 01/22/2021   Procedure: DIAGNOSTIC FLEXIBLE SIGMOIDOSCOPY;  Surgeon: Andria Meuse, MD;  Location: [facility];  Service: General;  Laterality: N/A;   INCISIONAL HERNIA REPAIR N/A 01/05/2022   Procedure: OPEN INCISIONAL HERNIA REPAIR WITH BARD MESH;  Surgeon: Quentin Ore, MD;  Location: [facility] OR;  Service: General;  Laterality: N/A;   INSERTION OF MESH  01/05/2022  Procedure: INSERTION OF MESH;  Surgeon: Quentin Ore, MD;  Location: [facility] OR;  Service: General;;   LAPAROTOMY N/A 01/22/2021   Procedure: EXPLORATORY LAPAROTOMY;  Surgeon: Carver Fila, MD;  Location: [facility];  Service: Gynecology;  Laterality: N/A;   LAPAROTOMY N/A 01/22/2021   Procedure: EXPLORATORY LAPAROTOMY;  Surgeon: Andria Meuse, MD;  Location: [facility];  Service: General;  Laterality: N/A;   MYRINGOTOMY WITH TUBE PLACEMENT     TUBAL LIGATION Bilateral 12/23/2012   WISDOM TOOTH EXTRACTION  02/22/1993   XI ROBOTIC ASSISTED SALPINGECTOMY Right 01/20/2021   Procedure: XI ROBOTIC ASSISTED RIGHT  SALPINGECTOMY, LEFT OVARIAN BIOPSY;  Surgeon: Carver Fila, MD;  Location: [facility];  Service: Gynecology;  Laterality: Right;    OB/GYN HISTORY: OB History  Gravida Para Term Preterm AB Living  2 2 1 1   2   SAB IAB Ectopic Multiple Live Births               # Outcome Date GA Lbr Len/2nd Weight Sex Delivery Anes PTL Lv  2 Term           1 Preterm             MEDICATIONS:  Current Facility-Administered Medications:    0.9 %  sodium chloride infusion, 250 mL, Intravenous, Continuous, Dewald, Bettina Gavia, MD, Stopped at 08/25/22 0602   Place/Maintain arterial line, , , Until Discontinued **AND** 0.9 %  sodium chloride infusion, , Intra-arterial, PRN, Martina Sinner, MD   0.9 %  sodium chloride infusion, 250 mL, Intravenous, Continuous, Dewald, Bettina Gavia, MD   acetaminophen (OFIRMEV) IV 1,000 mg, 1,000 mg, Intravenous, Q6H, Barnetta Chapel, PA-C, Paused at 08/25/22 7829   Chlorhexidine Gluconate Cloth 2 % PADS 6 each, 6 each, Topical, Q0600, Manus Rudd, MD   diphenhydrAMINE (BENADRYL) capsule 25 mg, 25 mg, Oral, Q6H PRN **OR** diphenhydrAMINE (BENADRYL) injection 25 mg, 25 mg, Intravenous, Q6H PRN, Barnetta Chapel, PA-C   enoxaparin (LOVENOX) injection 40 mg, 40 mg, Subcutaneous, Q24H, Barnetta Chapel, PA-C   fentaNYL (SUBLIMAZE) bolus via infusion 50-100 mcg, 50-100 mcg, Intravenous, Q15 min PRN, Migdalia Dk, MD, 75 mcg at 08/25/22 0758   fentaNYL in NS (44mcg/ml) infusion-PREMIX, 50-300 mcg/hr, Intravenous, Continuous, Ogan, Okoronkwo U, MD, Last Rate: 30  mL/hr at 08/25/22 0900, 300 mcg/hr at 08/25/22 0900   lactated ringers infusion, , Intravenous, Continuous, Agarwala, Ravi, MD, Last Rate: 25 mL/hr at 08/25/22 0900, Infusion Verify at 08/25/22 0900   [START ON 09/01/2022] levothyroxine (SYNTHROID, LEVOTHROID) injection 75 mcg, 75 mcg, Intravenous, Daily, Barnetta Chapel, PA-C   morphine (PF) 2 MG/ML injection 1-4 mg, 1-4 mg, Intravenous, Q2H PRN, Barnetta Chapel, PA-C, 2 mg at 08/24/22 2034   norepinephrine (LEVOPHED) 4mg  in (0.016 mg/mL) premix infusion, 0-40 mcg/min, Intravenous, Titrated, Ogan, Okoronkwo U, MD, Last Rate: 56.3 mL/hr at 08/25/22 1004, 15 mcg/min at 08/25/22 1004   [DISCONTINUED] ondansetron (ZOFRAN-ODT) disintegrating tablet 4 mg, 4 mg, Oral, Q6H PRN **OR** ondansetron (ZOFRAN) injection 4 mg, 4 mg, Intravenous, Q6H PRN, Barnetta Chapel, PA-C   Oral care mouth rinse, 15 mL, Mouth Rinse, Q2H, Manus Rudd, MD, 15 mL at 08/25/22 0930   Oral care mouth rinse, 15 mL, Mouth Rinse, PRN, Manus Rudd, MD   pantoprazole (PROTONIX) injection 40 mg, 40 mg, Intravenous, Q24H, Melody Comas B, MD, 40 mg at 08/24/22 2322   phenylephrine (NEO-SYNEPHRINE) 20mg /NS premix infusion, 25-200 mcg/min, Intravenous, Titrated, Martina Sinner, MD, Last Rate: 26.3 mL/hr at 08/25/22  0900, 35 mcg/min at 08/25/22 0900   piperacillin-tazobactam (ZOSYN) IVPB 3.375 g, 3.375 g, Intravenous, Q8H, Barnetta Chapel, PA-C, Last Rate: 12.5 mL/hr at 08/25/22 0900, Infusion Verify at 08/25/22 0900   propofol (DIPRIVAN) 1000 MG/100ML infusion, 0-50 mcg/kg/min, Intravenous, Continuous, Ogan, Okoronkwo U, MD, Last Rate: 25.5 mL/hr at 08/25/22 0900, 50 mcg/kg/min at 08/25/22 0900  ALLERGIES: Allergies  Allergen Reactions   Prednisone Other (See Comments)    Facial swelling      FAMILY HISTORY: Family History  Problem Relation Age of Onset   Diabetes Father    Leukemia Father    Heart disease Maternal Grandmother    Heart disease Maternal  Grandfather    Heart disease Paternal Grandmother    Skin cancer Other    Colon cancer Neg Hx    Breast cancer Neg Hx    Ovarian cancer Neg Hx    Endometrial cancer Neg Hx    Prostate cancer Neg Hx    Pancreatic cancer Neg Hx     SOCIAL HISTORY: Social History   Socioeconomic History   Marital status: Married    Spouse name: Not on file   Number of children: Not on file   Years of education: Not on file   Highest education level: Not on file  Occupational History   Not on file  Tobacco Use   Smoking status: Former    Packs/day: 0.25    Years: 20.00    Additional pack years: 0.00    Total pack years: 5.00    Types: Cigarettes    Quit date: 2013    Years since quitting: 11.5   Smokeless tobacco: Never  Vaping Use   Vaping Use: Never used  Substance and Sexual Activity   Alcohol use: Yes    Alcohol/week: 6.0 standard drinks of alcohol    Types: 6 Standard drinks or equivalent per week   Drug use: Never   Sexual activity: Yes    Birth control/protection: Surgical    Comment: TL  Other Topics Concern   Not on file  Social History Narrative   Not on file   Social Determinants of Health   Financial Resource Strain: Not on file  Food Insecurity: Not on file  Transportation Needs: Not on file  Physical Activity: Not on file  Stress: Not on file  Social Connections: Not on file  Intimate Partner Violence: Not on file    REVIEW OF SYSTEMS: Unable to collect  PHYSICAL EXAM: BP (!) 98/52 (BP Location: Left Arm)   Pulse 95   Temp 98.6 F (37 C) (Axillary)   Resp 18   Ht 5\' 3"  (1.6 m)   Wt 187 lb 6.3 oz (85 kg)   SpO2 94%   BMI 33.19 kg/m  General: Intubated, sedated. HEENT: Normocephalic, atraumatic.  Chest: Intubated, mild crackles at lung bases anteriorly. Cardiovascular: HR 90s, regular rhythm, no murmurs, rubs, or gallops. Abdomen: Hypoactive bowel sounds.  Moderately distended, tender to palpation (withdraws initially on exam).  ABThera in place  draining mildly purulent appearing serosanguinous fluid (400cc in canister). RLQ drain with minimal serous fluid. Extremities: Mildly cool to touch, pedal pulses appreciated.  Trace edema bilaterally. Skin: No rashes or lesions.  LABORATORY AND RADIOLOGIC DATA:    Latest Ref Rng & Units 08/25/2022    4:16 AM 08/24/2022    9:37 PM 08/24/2022   11:29 AM  CBC  WBC 4.0 - 10.5 K/uL 14.7  5.9  8.2   Hemoglobin 12.0 - 15.0 g/dL 62.9  13.2  15.3   Hematocrit 36.0 - 46.0 % 42.5  42.7  44.9   Platelets 150 - 400 K/uL 397  317  300       Latest Ref Rng & Units 08/25/2022    4:16 AM 08/24/2022    9:37 PM 08/24/2022   11:29 AM  BMP  Glucose 70 - 99 mg/dL 086  578  469   BUN 6 - 20 mg/dL 18  17  20    Creatinine 0.44 - 1.00 mg/dL 6.29  5.28  4.13   Sodium 135 - 145 mmol/L 130  130  131   Potassium 3.5 - 5.1 mmol/L 3.8  3.7  3.1   Chloride 98 - 111 mmol/L 99  99  95   CO2 22 - 32 mmol/L 19  20  22    Calcium 8.9 - 10.3 mg/dL 7.3  7.3  8.8          Component Ref Range & Units 04:13 (08/25/22) 1 d ago (08/24/22) 1 d ago (08/24/22) 1 d ago (08/24/22)  Lactic Acid, Venous 0.5 - 1.9 mmol/L 3.1 High Panic  5.0 High Panic  CM 2.3 High Panic  CM 2.7 High Panic  CM      ASSESSMENT AND PLAN: Liselle Zombro is a 47 y.o. woman with history of complex pelvic fistula involving the bladder, uterus/cervix, and left adnexa s/p aborted surgery with most recent imaging showing no evidence of previously seen fistula and decreased size of adnexal mass who was taken to the OR urgently yesterday by general surgery in the setting of concern for viscous perforation given pneumoperitoneum, free air in the retroperitoneum. Retroperitoneal abscess found intra-op posterior to the rectosigmoid colon without obvious bowel perforation. Given hypotension and increasing pressor requirement, patient's abdomen left open with ABThera in place, small bowel in discontinuity.   Appreciate general surgery involvement in the care of this  complex patient. I spoke with multiple members of the general surgery team this morning including Dr. Corliss Skains who performed her surgery yesterday. Given intra-operative findings (consistent with somewhat frozen pelvis encountered at the time of her surgeries in 2022), I think definitive management at this time would be quite challenging. If the patient can be temporarily stabilized with stool diversion, I think she would benefit from future surgical coordination at a large tertiary care center such as [facility].  Eugene Garnet MD Gynecologic Oncology

## 2022-08-25 NOTE — Progress Notes (Signed)
eLink Physician-Brief Progress Note Patient Name: Brittney Herrera DOB: [birth year] MRN: [id number]   Date of Service  08/25/2022  HPI/Events of Note  CBC result pending.  eICU Interventions          Migdalia Dk 08/25/2022, 8:06 PM

## 2022-08-25 NOTE — Progress Notes (Addendum)
0730 CMP not resulted though showing for lab, nightshift RN picked up hard copy at 0655 from lab. Will add to shadow chart.   0830 all AM labs have now resulted.

## 2022-08-25 NOTE — Progress Notes (Signed)
Patient off the floor at 0030, en route to [facility] [id number].

## 2022-08-25 NOTE — Procedures (Signed)
Arterial Catheter Insertion Procedure Note  Brittney Herrera  [id number]  1975-03-22  Date:08/25/22  Time:3:08 AM    Provider Performing: Rutherford Guys    Procedure: Insertion of Arterial Line (78295) with US guidance (62130)   Indication(s) Blood pressure monitoring and/or need for frequent ABGs  Consent Unable to obtain consent due to emergent nature of procedure.  Anesthesia None   Time Out Verified patient identification, verified procedure, site/side was marked, verified correct patient position, special equipment/implants available, medications/allergies/relevant history reviewed, required imaging and test results available.   Sterile Technique Maximal sterile technique including full sterile barrier drape, hand hygiene, sterile gown, sterile gloves, mask, hair covering, sterile ultrasound probe cover (if used).   Procedure Description Area of catheter insertion was cleaned with chlorhexidine and draped in sterile fashion. With real-time ultrasound guidance an arterial catheter was placed into the right femoral artery.  Appropriate arterial tracings confirmed on monitor.     Complications/Tolerance None; patient tolerated the procedure well.   EBL Minimal   Specimen(s) None   Rutherford Guys, PA - [facility] For pager details, please see AMION or use Epic chat  After 1900, please call [facility] for cross coverage needs 08/25/2022, 3:09 AM

## 2022-08-25 NOTE — Progress Notes (Signed)
Labs continue to be canceled by [facility]. States it is a duplicate order. Bedside RN instructed to collect labs and send requisition down with labs per Lab technician.

## 2022-08-26 ENCOUNTER — Inpatient Hospital Stay (HOSPITAL_COMMUNITY): Payer: BC Managed Care – PPO | Admitting: Anesthesiology

## 2022-08-26 ENCOUNTER — Other Ambulatory Visit: Payer: Self-pay

## 2022-08-26 ENCOUNTER — Encounter (HOSPITAL_COMMUNITY): Admission: EM | Disposition: A | Discharge status: Home Health | Payer: Self-pay | Source: Home / Self Care

## 2022-08-26 DIAGNOSIS — A419 Sepsis, unspecified organism: Secondary | ICD-10-CM | POA: Diagnosis not present

## 2022-08-26 DIAGNOSIS — K668 Other specified disorders of peritoneum: Secondary | ICD-10-CM | POA: Diagnosis not present

## 2022-08-26 HISTORY — PX: LAPAROTOMY: SHX154

## 2022-08-26 LAB — MAGNESIUM: Magnesium: 2.7 mg/dL — ABNORMAL HIGH (ref 1.7–2.4)

## 2022-08-26 LAB — TYPE AND SCREEN
ABO/RH(D): A POS
Unit division: 0
Unit division: 0

## 2022-08-26 LAB — TRIGLYCERIDES: Triglycerides: 442 mg/dL — ABNORMAL HIGH (ref <150)

## 2022-08-26 LAB — BASIC METABOLIC PANEL
Anion gap: 10 (ref 5–15)
BUN: 22 mg/dL — ABNORMAL HIGH (ref 6–20)
CO2: 22 mmol/L (ref 22–32)
Calcium: 6.9 mg/dL — ABNORMAL LOW (ref 8.9–10.3)
Chloride: 101 mmol/L (ref 98–111)
Creatinine, Ser: 1.58 mg/dL — ABNORMAL HIGH (ref 0.44–1.00)
GFR, Estimated: 41 mL/min — ABNORMAL LOW (ref >60)
Glucose, Bld: 118 mg/dL — ABNORMAL HIGH (ref 70–99)
Potassium: 3.3 mmol/L — ABNORMAL LOW (ref 3.5–5.1)
Sodium: 133 mmol/L — ABNORMAL LOW (ref 135–145)

## 2022-08-26 LAB — BPAM RBC: Unit Type and Rh: 6200

## 2022-08-26 LAB — CBC
HCT: 31.1 % — ABNORMAL LOW (ref 36.0–46.0)
Hemoglobin: 10.2 g/dL — ABNORMAL LOW (ref 12.0–15.0)
MCH: 33.3 pg (ref 26.0–34.0)
MCHC: 32.8 g/dL (ref 30.0–36.0)
MCV: 101.6 fL — ABNORMAL HIGH (ref 80.0–100.0)
Platelets: 266 10*3/uL (ref 150–400)
RBC: 3.06 MIL/uL — ABNORMAL LOW (ref 3.87–5.11)
RDW: 15 % (ref 11.5–15.5)
WBC: 7.9 10*3/uL (ref 4.0–10.5)
nRBC: 0 % (ref 0.0–0.2)

## 2022-08-26 LAB — POCT I-STAT 7, (LYTES, BLD GAS, ICA,H+H)
Acid-base deficit: 2 mmol/L (ref 0.0–2.0)
Bicarbonate: 23.5 mmol/L (ref 20.0–28.0)
Calcium, Ion: 1.01 mmol/L — ABNORMAL LOW (ref 1.15–1.40)
HCT: 31 % — ABNORMAL LOW (ref 36.0–46.0)
Hemoglobin: 10.5 g/dL — ABNORMAL LOW (ref 12.0–15.0)
O2 Saturation: 99 %
Patient temperature: 36.9
Potassium: 3.5 mmol/L (ref 3.5–5.1)
Sodium: 133 mmol/L — ABNORMAL LOW (ref 135–145)
TCO2: 25 mmol/L (ref 22–32)
pCO2 arterial: 44.4 mmHg (ref 32–48)
pH, Arterial: 7.331 — ABNORMAL LOW (ref 7.35–7.45)
pO2, Arterial: 126 mmHg — ABNORMAL HIGH (ref 83–108)

## 2022-08-26 LAB — POCT I-STAT EG7
Acid-base deficit: 1 mmol/L (ref 0.0–2.0)
Bicarbonate: 25.1 mmol/L (ref 20.0–28.0)
Calcium, Ion: 1.01 mmol/L — ABNORMAL LOW (ref 1.15–1.40)
HCT: 29 % — ABNORMAL LOW (ref 36.0–46.0)
Hemoglobin: 9.9 g/dL — ABNORMAL LOW (ref 12.0–15.0)
O2 Saturation: 90 %
Potassium: 3.4 mmol/L — ABNORMAL LOW (ref 3.5–5.1)
Sodium: 136 mmol/L (ref 135–145)
TCO2: 26 mmol/L (ref 22–32)
pCO2, Ven: 46.9 mmHg (ref 44–60)
pH, Ven: 7.336 (ref 7.25–7.43)
pO2, Ven: 63 mmHg — ABNORMAL HIGH (ref 32–45)

## 2022-08-26 LAB — SURGICAL PCR SCREEN
MRSA, PCR: NEGATIVE
Staphylococcus aureus: NEGATIVE

## 2022-08-26 LAB — GLUCOSE, CAPILLARY
Glucose-Capillary: 132 mg/dL — ABNORMAL HIGH (ref 70–99)
Glucose-Capillary: 143 mg/dL — ABNORMAL HIGH (ref 70–99)

## 2022-08-26 LAB — PHOSPHORUS: Phosphorus: 5 mg/dL — ABNORMAL HIGH (ref 2.5–4.6)

## 2022-08-26 LAB — PREPARE RBC (CROSSMATCH)

## 2022-08-26 SURGERY — LAPAROTOMY, EXPLORATORY
Anesthesia: General | Site: Abdomen

## 2022-08-26 MED ORDER — INSULIN ASPART 100 UNIT/ML IJ SOLN
0.0000 [IU] | Freq: Four times a day (QID) | INTRAMUSCULAR | Status: DC
  Administered 2022-08-26: 1 [IU] via SUBCUTANEOUS
  Administered 2022-08-27: 2 [IU] via SUBCUTANEOUS
  Administered 2022-08-27 – 2022-08-30 (×10): 1 [IU] via SUBCUTANEOUS

## 2022-08-26 MED ORDER — 0.9 % SODIUM CHLORIDE (POUR BTL) OPTIME
TOPICAL | Status: DC | PRN
  Administered 2022-08-26 (×2): 1000 mL

## 2022-08-26 MED ORDER — FENTANYL CITRATE (PF) 100 MCG/2ML IJ SOLN
INTRAMUSCULAR | Status: DC | PRN
  Administered 2022-08-26 (×5): 50 ug via INTRAVENOUS

## 2022-08-26 MED ORDER — PROPOFOL 10 MG/ML IV BOLUS
INTRAVENOUS | Status: AC
  Filled 2022-08-26: qty 20

## 2022-08-26 MED ORDER — MIDAZOLAM HCL 5 MG/5ML IJ SOLN
INTRAMUSCULAR | Status: DC | PRN
  Administered 2022-08-26: 2 mg via INTRAVENOUS

## 2022-08-26 MED ORDER — ROCURONIUM BROMIDE 10 MG/ML (PF) SYRINGE
PREFILLED_SYRINGE | INTRAVENOUS | Status: AC
  Filled 2022-08-26: qty 20

## 2022-08-26 MED ORDER — FENTANYL CITRATE (PF) 250 MCG/5ML IJ SOLN
INTRAMUSCULAR | Status: AC
  Filled 2022-08-26: qty 5

## 2022-08-26 MED ORDER — ACETAMINOPHEN 10 MG/ML IV SOLN
INTRAVENOUS | Status: DC | PRN
  Administered 2022-08-26: 1000 mg via INTRAVENOUS

## 2022-08-26 MED ORDER — ROCURONIUM BROMIDE 100 MG/10ML IV SOLN
INTRAVENOUS | Status: DC | PRN
  Administered 2022-08-26: 20 mg via INTRAVENOUS
  Administered 2022-08-26: 50 mg via INTRAVENOUS
  Administered 2022-08-26: 20 mg via INTRAVENOUS
  Administered 2022-08-26: 90 mg via INTRAVENOUS
  Administered 2022-08-26: 20 mg via INTRAVENOUS

## 2022-08-26 MED ORDER — TRAVASOL 10 % IV SOLN: INTRAVENOUS | Status: DC

## 2022-08-26 MED ORDER — ALBUMIN HUMAN 5 % IV SOLN: INTRAVENOUS | Status: DC | PRN

## 2022-08-26 MED ORDER — MIDAZOLAM HCL 2 MG/2ML IJ SOLN
INTRAMUSCULAR | Status: AC
  Filled 2022-08-26: qty 2

## 2022-08-26 MED ORDER — TRAVASOL 10 % IV SOLN
INTRAVENOUS | Status: AC
  Filled 2022-08-26: qty 496.8

## 2022-08-26 MED ORDER — LACTATED RINGERS IV SOLN: INTRAVENOUS | Status: DC | PRN

## 2022-08-26 SURGICAL SUPPLY — 52 items
APL PRP STRL LF DISP 70% ISPRP (MISCELLANEOUS)
BAG COUNTER SPONGE SURGICOUNT (BAG) ×1 IMPLANT
BAG SPNG CNTER NS LX DISP (BAG) ×1
BLADE CLIPPER SURG (BLADE) IMPLANT
BNDG GAUZE DERMACEA FLUFF 4 (GAUZE/BANDAGES/DRESSINGS) IMPLANT
BNDG GZE DERMACEA 4 6PLY (GAUZE/BANDAGES/DRESSINGS) ×1
CANISTER SUCT 3000ML PPV (MISCELLANEOUS) ×1 IMPLANT
CHLORAPREP W/TINT 26 (MISCELLANEOUS) ×1 IMPLANT
COVER SURGICAL LIGHT HANDLE (MISCELLANEOUS) ×1 IMPLANT
DRAPE LAPAROSCOPIC ABDOMINAL (DRAPES) ×1 IMPLANT
DRAPE WARM FLUID 44X44 (DRAPES) ×1 IMPLANT
ELECT BLADE 6.5 EXT (BLADE) IMPLANT
ELECT REM PT RETURN 9FT ADLT (ELECTROSURGICAL) ×1
ELECTRODE REM PT RTRN 9FT ADLT (ELECTROSURGICAL) ×1 IMPLANT
GAUZE PAD ABD 8X10 STRL (GAUZE/BANDAGES/DRESSINGS) IMPLANT
GLOVE BIO SURGEON STRL SZ7.5 (GLOVE) ×1 IMPLANT
GLOVE INDICATOR 8.0 STRL GRN (GLOVE) ×1 IMPLANT
GOWN STRL REUS W/ TWL LRG LVL3 (GOWN DISPOSABLE) ×1 IMPLANT
GOWN STRL REUS W/ TWL XL LVL3 (GOWN DISPOSABLE) ×1 IMPLANT
GOWN STRL REUS W/TWL LRG LVL3 (GOWN DISPOSABLE) ×1
GOWN STRL REUS W/TWL XL LVL3 (GOWN DISPOSABLE) ×2
HANDLE SUCTION POOLE (INSTRUMENTS) ×1 IMPLANT
KIT BASIN OR (CUSTOM PROCEDURE TRAY) ×1 IMPLANT
KIT OSTOMY DRAINABLE 2.75 STR (WOUND CARE) IMPLANT
KIT TURNOVER KIT B (KITS) ×1 IMPLANT
LIGASURE IMPACT 36 18CM CVD LR (INSTRUMENTS) IMPLANT
NS IRRIG 1000ML POUR BTL (IV SOLUTION) ×2 IMPLANT
PACK GENERAL/GYN (CUSTOM PROCEDURE TRAY) ×1 IMPLANT
PAD ARMBOARD 7.5X6 YLW CONV (MISCELLANEOUS) ×1 IMPLANT
PENCIL SMOKE EVACUATOR (MISCELLANEOUS) ×1 IMPLANT
RELOAD GRN CONTOUR (ENDOMECHANICALS) ×1 IMPLANT
RELOAD PROXIMATE 75MM BLUE (ENDOMECHANICALS) ×2 IMPLANT
RELOAD STAPLE 40 GRN THCK (ENDOMECHANICALS) IMPLANT
RELOAD STAPLE 75 3.8 BLU REG (ENDOMECHANICALS) IMPLANT
SPECIMEN JAR LARGE (MISCELLANEOUS) IMPLANT
SPONGE T-LAP 18X18 ~~LOC~~+RFID (SPONGE) IMPLANT
STAPLER CVD CUT GN 40 RELOAD (ENDOMECHANICALS) ×1 IMPLANT
STAPLER CVD CUT GRN 40 RELOAD (ENDOMECHANICALS) IMPLANT
STAPLER GUN LINEAR PROX 60 (STAPLE) IMPLANT
STAPLER PROXIMATE 75MM BLUE (STAPLE) IMPLANT
STAPLER VISISTAT 35W (STAPLE) ×1 IMPLANT
SUCTION POOLE HANDLE (INSTRUMENTS) ×1
SUT PDS AB 1 TP1 96 (SUTURE) ×2 IMPLANT
SUT PROLENE 2 0 SH 30 (SUTURE) IMPLANT
SUT SILK 2 0 SH CR/8 (SUTURE) ×1 IMPLANT
SUT SILK 2 0 TIES 10X30 (SUTURE) ×1 IMPLANT
SUT SILK 3 0 SH CR/8 (SUTURE) ×1 IMPLANT
SUT SILK 3 0 TIES 10X30 (SUTURE) ×1 IMPLANT
SUT VIC AB 3-0 SH 8-18 (SUTURE) IMPLANT
TOWEL GREEN STERILE (TOWEL DISPOSABLE) ×1 IMPLANT
TRAY FOLEY MTR SLVR 16FR STAT (SET/KITS/TRAYS/PACK) ×1 IMPLANT
YANKAUER SUCT BULB TIP NO VENT (SUCTIONS) IMPLANT

## 2022-08-26 NOTE — Progress Notes (Signed)
PHARMACY - TOTAL PARENTERAL NUTRITION CONSULT NOTE   Indication:  bowel discontinuity  Patient Measurements: Height: 5\' 3"  (160 cm) Weight: 85 kg (187 lb 6.3 oz) IBW/kg (Calculated) : 52.4 TPN AdjBW (KG): 60.6 Body mass index is 33.19 kg/m. Usual Weight: 85 kg  Assessment:   47 y.o. F with history of a known pelvic mass that is follow by Dr. Pricilla Holm with [facility].  She underwent attempted resection in 2022 but given this was so adhered to multiple surrounding structures, it was unable to be removed. Biopsies were obtained at this time and this was not found to be malignant. Her last MRI revealed this was smaller in size. She reports being in her normal state of health until Saturday when she developed an acute onset of pelvic cramping and pain. Admitted for septic shock, started on Zosyn. Leukocytosis now resolved, improved hemodynamics. Renal function improving. Anticipated prolonged NPO status per Surgery. Pharmacy consulted to initiate TPN.   Glucose / Insulin: no hx DM, CBGs 118-178 s/ p x1 dexamethasone 7/02 Electrolytes: K 3.3, Na 133, phos 5.0, Mg 2.7 (s/p 4g IV Mg 7/03), CoCa Renal: Scr 1.58 trending down (bsl <1), BUN 22 Hepatic: LFTs wnl, tbili 2.1, Alb 2.0  Hx hypertriglyceridemia on propofol, daily TG ordered, TG up 377 > 442 Intake / Output; MIVF: UOP 94mL/kg/hr, emesis/NG output 315 mL, drains 880 mL  GI Imaging: 7/02 CT abd: Pneumomediastinum extending into the lower neck. There is pneumoperitoneum in the upper abdomen suggesting possible bowel perforation. Fatty liver. GI Surgeries / Procedures:  7/02 OR for ex-lap, area of small bowel resection - left in discontinuity with open abdomen 7/04 return to OR for washout +/- re-anastomosis   Central access: 7/03 TPN start date: 7/04 PM  Nutritional Goals: Goal TPN rate is 90 mL/hr (provides 99 g of protein and 1280 kcals per day + 660 kcals from propofol per day)  RD Assessment: Estimated Needs Total Energy Estimated  Needs: 1900-2100 Total Protein Estimated Needs: 105-125g Total Fluid Estimated Needs: >/=1.9L  Current Nutrition:  NPO  Plan:  Start TPN at 45 mL/hr at 1800 (to meet 50% total nutritional needs) > Propofol @ 25 ml/hr provides 660 kcal, ~35% total kcal needs daily. No lipids in TPN  Electrolytes in TPN: Na 57mEq/L, K 25 mEq/L, Ca 82mEq/L, Mg 0 mEq/L, Phos 0 mmol/L. Cl:Ac 1:1 Add standard MVI and trace elements to TPN, chromium held  Initiate Sensitive q6h SSI and adjust as needed  MIVF to 10 mL/hr Monitor TPN labs on Mon/Thurs, daily as needed Daiy TG due to history of hyper  Thank you for allowing pharmacy to be a part of this patient's care.  Thelma Barge, PharmD Clinical Pharmacist

## 2022-08-26 NOTE — Progress Notes (Signed)
2 Days Post-Op  Subjective: Sedated on vent with open abdomen.  Not making much urine.  Only 75cc overnight.  Only 20cc so far this am.  NGT with minimal output.  On levo/neo  ROS: See above, otherwise other systems negative  Objective: Vital signs in last 24 hours: Temp:  [97.9 F (36.6 C)-99.5 F (37.5 C)] 98.8 F (37.1 C) (07/04 0800) Pulse Rate:  [65-95] 69 (07/04 0800) Resp:  [16-31] 30 (07/04 0800) BP: (89-112)/(54-67) 96/61 (07/04 0800) SpO2:  [93 %-98 %] 96 % (07/04 0800) Arterial Line BP: (67-122)/(35-67) 102/53 (07/04 0800) FiO2 (%):  [40 %-50 %] 40 % (07/04 0800) Last BM Date :  (pta)  Intake/Output from previous day: 07/03 0701 - 07/04 0700 In: 3875 [I.V.:2831; IV Piggyback:1029] Out: 3199 [Urine:2005; Emesis/NG output:315; Drains:879] Intake/Output this shift: Total I/O In: 90.5 [I.V.:78; IV Piggyback:12.5] Out: 212.5 [Urine:105; Drains:107.5]  PE: Gen: critically ill on vent Heart: regular Lungs: CTAB, on vent, ETT in place Abd: distended, vac in place and abdomen open, slightly cloudy serosang output noted (350cc), JP drain with mostly serous output (200cc).  NGT in place with minimal output.  Multiple flushings to help get this working better. GU: minimal clear yellow urine present in foley Ext: no significant edema at this point  Lab Results:  Recent Labs    08/25/22 0416 08/25/22 1011 08/26/22 0434 08/26/22 0506  WBC 14.7*  --   --  7.9  HGB 13.4   < > 10.5* 10.2*  HCT 42.5   < > 31.0* 31.1*  PLT 397  --   --  266   < > = values in this interval not displayed.   BMET Recent Labs    08/25/22 0416 08/25/22 1011 08/26/22 0434 08/26/22 0506  NA 130*   < > 133* 133*  K 3.8   < > 3.5 3.3*  CL 99  --   --  101  CO2 19*  --   --  22  GLUCOSE 178*  --   --  118*  BUN 18  --   --  22*  CREATININE 1.66*  --   --  1.58*  CALCIUM 7.3*  --   --  6.9*   < > = values in this interval not displayed.   PT/INR No results for input(s):  "LABPROT", "INR" in the last 72 hours. CMP     Component Value Date/Time   NA 133 (L) 08/26/2022 0506   K 3.3 (L) 08/26/2022 0506   CL 101 08/26/2022 0506   CO2 22 08/26/2022 0506   GLUCOSE 118 (H) 08/26/2022 0506   BUN 22 (H) 08/26/2022 0506   CREATININE 1.58 (H) 08/26/2022 0506   CALCIUM 6.9 (L) 08/26/2022 0506   PROT 5.1 (L) 08/25/2022 0416   ALBUMIN 2.0 (L) 08/25/2022 0416   AST 25 08/25/2022 0416   ALT 20 08/25/2022 0416   ALKPHOS 46 08/25/2022 0416   BILITOT 2.1 (H) 08/25/2022 0416   GFRNONAA 41 (L) 08/26/2022 0506   Lipase     Component Value Date/Time   LIPASE 15 08/25/2022 0416       Studies/Results: DG CHEST PORT 1 VIEW  Result Date: 08/25/2022 CLINICAL DATA:  Evaluate ET tube placement. EXAM: PORTABLE CHEST 1 VIEW COMPARISON:  CT 08/23/2020 FINDINGS: ET tube tip is above the carina. There is a enteric tube with tip and side port in the stomach. Stable cardiomediastinal contours. Lung volumes are low. Persistent bibasilar atelectasis. IMPRESSION: ET tube tip is above  the carina. Low lung volumes with persistent bibasilar atelectasis. Electronically Signed   By: Signa Kell M.D.   On: 08/25/2022 09:32   DG Abd 1 View  Result Date: 08/24/2022 CLINICAL DATA:  Check gastric catheter placement EXAM: ABDOMEN - 1 VIEW COMPARISON:  None Available. FINDINGS: Gastric catheter is coiled within the stomach. No obstructive changes are seen. Contrast material is noted within the kidneys from recent CT examination. No obstructive changes are noted. IMPRESSION: Gastric catheter within the stomach. Electronically Signed   By: Alcide Clever M.D.   On: 08/24/2022 21:41   CT ABDOMEN PELVIS W CONTRAST  Result Date: 08/24/2022 CLINICAL DATA:  Abdominal pain, pain left lower quadrant EXAM: CT ABDOMEN AND PELVIS WITH CONTRAST TECHNIQUE: Multidetector CT imaging of the abdomen and pelvis was performed using the standard protocol following bolus administration of intravenous contrast.  RADIATION DOSE REDUCTION: This exam was performed according to the departmental dose-optimization program which includes automated exposure control, adjustment of the mA and/or kV according to patient size and/or use of iterative reconstruction technique. CONTRAST:  OMNIPAQUE IOHEXOL 350 MG/ML SOLN COMPARISON:  None Available. FINDINGS: Lower chest: There are patchy infiltrates in both lower lung fields suggesting atelectasis/pneumonia. Hepatobiliary: Liver is enlarged measuring 24.8 cm in length. The decreased density in liver in comparison to the spleen suggesting fatty infiltration. There is no dilation of bile ducts. Gallbladder is unremarkable. Pancreas: No focal abnormalities are seen. Spleen: Unremarkable. Adrenals/Urinary Tract: Adrenals are unremarkable. There is no hydronephrosis. There is 4 mm calculus in the upper midportion of left kidney. There are few low-density foci in renal cortex measuring up to 10 mm suggesting renal cysts. Ureters are not dilated. Urinary bladder is not distended. There are no pockets of air in the lumen of the bladder. Stomach/Bowel: Stomach is unremarkable. There is no dilation of small bowel loops. Appendix is not seen. Scattered diverticula seen in colon. There is diffuse wall thickening in sigmoid colon. Large amount of free air is noted in retroperitoneum extending into the mesentery. Pneumoperitoneum is seen. Vascular/Lymphatic: Vascular structures are unremarkable. There are slightly enlarged lymph nodes in retroperitoneum and mesentery. Reproductive: Uterus is displaced anteriorly. Endometrial cavity in the fundus appears be divided suggesting possible septate uterus. Other: There are multiple pockets of air in retroperitoneum and pockets of pneumoperitoneum. There is small amount of fluid in the mesentery. Small ascites is present. Musculoskeletal: No acute findings are seen. IMPRESSION: Large amount of air is seen in the retroperitoneum. There is  pneumoperitoneum. Findings suggest bowel perforation, possibly in sigmoid colon. There is diffuse wall thickening in sigmoid colon suggesting colitis. Less likely possibility would be acute diverticulitis. Multiple diverticula are seen in colon. There is no loculated pericolic fluid collection. When the patient's clinical condition permits, endoscopy should be considered to rule out any underlying neoplastic process. There is no evidence of small-bowel obstruction. There is no hydronephrosis. Enlarged fatty liver. Left renal calculus. Few renal cysts. Patchy infiltrates in the lower lung fields may suggest atelectasis/pneumonia. Imaging findings were relayed to patient's provider Dr. Hattie Perch by telephone call at 2:38 p.m. on 08/24/2022. Electronically Signed   By: Ernie Avena M.D.   On: 08/24/2022 14:42   CT Angio Chest PE W and/or Wo Contrast  Result Date: 08/24/2022 CLINICAL DATA:  Chest pain, abdominal pain, back pain EXAM: CT ANGIOGRAPHY CHEST WITH CONTRAST TECHNIQUE: Multidetector CT imaging of the chest was performed using the standard protocol during bolus administration of intravenous contrast. Multiplanar CT image reconstructions and MIPs were obtained  to evaluate the vascular anatomy. RADIATION DOSE REDUCTION: This exam was performed according to the departmental dose-optimization program which includes automated exposure control, adjustment of the mA and/or kV according to patient size and/or use of iterative reconstruction technique. CONTRAST:  OMNIPAQUE IOHEXOL 350 MG/ML SOLN COMPARISON:  None Available. FINDINGS: Cardiovascular: There is homogeneous enhancement in thoracic aorta. Ascending thoracic aorta measures 3.4 cm. There are no intraluminal filling defects in central pulmonary artery branches. Infiltrates and motion artifacts limit evaluation of small peripheral pulmonary artery branches. Mediastinum/Nodes: Pneumomediastinum is seen extending into the lower neck. Pneumoperitoneum  is noted in upper abdomen. Lungs/Pleura: There are patchy infiltrates in both lower lung fields. Faint ground-glass densities are seen in both lungs. There is no pleural effusion or pneumothorax. Upper Abdomen: There is fatty infiltration in liver. There is pneumoperitoneum. There are pockets of air in retroperitoneum. Musculoskeletal: There is decrease in height of the bodies of T2 and T6 vertebrae with no break in the cortical margins. Schmorl's nodes are seen in the upper endplates of bodies of T10 and T11 vertebrae. Review of the MIP images confirms the above findings. IMPRESSION: There is no evidence of central pulmonary artery embolism. There is no evidence of thoracic aortic dissection. There are patchy infiltrates in both lower lung fields suggesting atelectasis/pneumonia. There are faint ground-glass densities in both lungs suggesting possible pulmonary edema. Pneumomediastinum extending into the lower neck. There is pneumoperitoneum in the upper abdomen suggesting possible bowel perforation. Fatty liver. Decrease in height of multiple thoracic vertebral bodies may be residual from previous injury. Electronically Signed   By: Ernie Avena M.D.   On: 08/24/2022 14:11   US PELVIC COMPLETE WITH TRANSVAGINAL  Result Date: 08/24/2022 CLINICAL DATA:  Abdominal pain EXAM: TRANSABDOMINAL AND TRANSVAGINAL ULTRASOUND OF PELVIS TECHNIQUE: Both transabdominal and transvaginal ultrasound examinations of the pelvis were performed. Transabdominal technique was performed for global imaging of the pelvis including uterus, ovaries, adnexal regions, and pelvic cul-de-sac. It was necessary to proceed with endovaginal exam following the transabdominal exam to visualize the adnexa and endometrium. Of note as per the sonographer the patient had difficulty tolerating the procedure including the endovaginal portion of the exam COMPARISON:  MRI 11/13/2021 FINDINGS: Uterus Measurements: 10.1 x 4.7 x 6.4 cm = volume: 158.0  mL. Poorly visualized. No obvious mass lesion. On prior MRI there was evidence of a septated uterus and areas of adenomyosis. Endometrium Thickness: 5 mm.  No focal abnormality visualized. Ovaries Not well seen today Other findings No abnormal free fluid. Transabdominal images are also limited due to contracted urinary bladder and overlapping bowel gas and soft tissue. Poor sonographic window. IMPRESSION: Very limited study. Ovaries are not well seen. No separate adnexal mass or fluid. Additional cross-sectional imaging workup as clinically appropriate Electronically Signed   By: Karen Kays M.D.   On: 08/24/2022 12:55    Anti-infectives: Anti-infectives (From admission, onward)    Start     Dose/Rate Route Frequency Ordered Stop   08/24/22 2200  piperacillin-tazobactam (ZOSYN) IVPB 3.375 g        3.375 g 12.5 mL/hr over 240 Minutes Intravenous Every 8 hours 08/24/22 1635 08/31/22 2159   08/24/22 1430  piperacillin-tazobactam (ZOSYN) IVPB 3.375 g        3.375 g 100 mL/hr over 30 Minutes Intravenous  Once 08/24/22 1423 08/24/22 1608        Assessment/Plan POD 2, s/p ex lap with extensive LOA, SBR in discontinuity, placement of pelvic drain, placement of ABThera vacuum dressing, Dr. Corliss Skains  7/2 for Pneumoperitoneum, retroperitoneal air, mediastinal air likely secondary to perforated rectosigmoid colon with history of pelvic mass  -abdomen is open, keep sedated on vent -will return to OR today for washout, possible re-anastomosis, possible colostomy, and possible closure vs just washout if unable to complete the above.  I have called and spoken to her husband, Apolinar Junes, this morning for an update and to inform him of this plan.  He is agreeable to proceed. Kathlen Brunswick RN witnessed consent over phone.  -cont abx therapy -cont JP drain -daily labs -NGT in place  - some bilious output. Continue.  -anticipate even if things are put back together, it being may days before enteral nutrition is a  realistic option. For these reasons, would consider starting TPN.   FEN - NPO/NGT/IVFs/pressors/bowel discontinuity VTE - lovenox ID - zosyn  AKI - continue fluid resuscitation, daily labs Septic shock - secondary to above, appreciate CCM assistance Post op respiratory failure - remain sedated on vent due to open abdomen, appreciate CCM assistance Hypokalemia - continue fluids, monitor Hyponatremia - improved Hypothyroidism - IV synthroid daily Anxiety  RA   LOS: 2 days   Marin Olp, MD [facility]

## 2022-08-26 NOTE — Progress Notes (Signed)
Nutrition Follow-up  DOCUMENTATION CODES:   Obesity unspecified  INTERVENTION:   TPN management per Pharmacy TPN to start 7/4   NUTRITION DIAGNOSIS:   Inadequate oral intake related to acute illness as evidenced by NPO status. Ongoing.   GOAL:   Patient will meet greater than or equal to 90% of their needs Progressing with TPN initiation   MONITOR:   Vent status, Labs, Weight trends, I & O's  REASON FOR ASSESSMENT:   Ventilator    ASSESSMENT:   Pt admitted with pneumoperitoneum. PMH significant for pelvic mass, s/p attempted resection (2022), and open incisional hernia repair (2023).  Pt discussed during ICU rounds and with RN.  Discussed with TPN with MD, ok to start today, notified TPN Pharmacist  Medications reviewed and include: synthroid, protonix Fentanyl  Levophed @ 5 mcg Neo off Propofol @ 25 ml/hr provides: 660 kcal   Labs reviewed: Na 133 K 3.3 PO4 5.0 Magnesium: 2.7 TG: 442 CBG: 138  14 F OG tube; tip within stomach with 315 ml out UOP: 2005 ml  1 R JP: 179 ml Abd VAC: 700 ml I&O: +8.5 L   Diet Order:   Diet Order             Diet NPO time specified  Diet effective now                   EDUCATION NEEDS:   No education needs have been identified at this time  Skin:  Skin Assessment: Skin Integrity Issues: Skin Integrity Issues:: Wound VAC Wound Vac: abdomen  Last BM:  PTA  Height:   Ht Readings from Last 1 Encounters:  08/24/22 5\' 3"  (1.6 m)    Weight:   Wt Readings from Last 1 Encounters:  08/24/22 85 kg    Ideal Body Weight:  52.3 kg  BMI:  Body mass index is 33.19 kg/m.  Estimated Nutritional Needs:   Kcal:  1900-2100  Protein:  105-125g  Fluid:  >/=1.9L  Maryam Feely P., RD, LDN, CNSC See AMiON for contact information

## 2022-08-26 NOTE — Progress Notes (Signed)
NAME:  Brittney Herrera, MRN:  [id number], DOB:  1975/05/31, LOS: 2 ADMISSION DATE:  08/24/2022, CONSULTATION DATE:  08/24/22 REFERRING MD:  CCS CHIEF COMPLAINT:  Shock   History of Present Illness:  Brittney Herrera is 47 year old woman who presented today for abdominal pain found to have free air in the retroperitoneum of the pelvis extending into the mediastinum based on CT imaging. She was taken to the OR for exploratory laparotomy with findings notable for peritonitis, retroperitoneal abscess with a section of small bowel with serosal injury requiring resection. Her appendix was also resected due to dense adhesions to the right adnexa. The small bowel became distended throughout the procedure and she was left in discontinuity. She developed increasing vasopressor needs as well. Her abdomen was left open.  A 19 french drain was placed down in the retroperitoneal abscess space. A abdominal wound vac was put in place. The patient remained intubated and on neosynephrine. She was transferred to the ICU.   PCCM consulted for medical management.   Pertinent  Medical History   Past Medical History:  Diagnosis Date   Arthritis    Bicornuate uterus    Hemorrhoids 11/18/2020   History of abnormal cervical Pap smear    Hypertriglyceridemia 11/18/2020   Hypothyroidism    Rheumatoid aortitis    Thyroid disease    Umbilical hernia 11/28/2020   Significant Hospital Events: Including procedures, antibiotic start and stop dates in addition to other pertinent events   7/2 admitted, OR for ex-lap, area of small bowel resection - left in discontinuity with open abdomen 7/3 in ICU.  Improving pressor requirements. 7/4 return to OR for washout.  Interim History / Subjective:  Plan to return to the OR today for washout and possible reanastomosis  Objective   Blood pressure 109/75, pulse 67, temperature 99.1 F (37.3 C), temperature source Esophageal, resp. rate (!) 30, height 5\' 3"  (1.6 m), weight 85 kg,  SpO2 95 %.    Vent Mode: PRVC FiO2 (%):  [40 %] 40 % Set Rate:  [30 bmp] 30 bmp Vt Set:  [420 mL] 420 mL PEEP:  [5 cmH20] 5 cmH20 Plateau Pressure:  [20 cmH20-21 cmH20] 20 cmH20   Intake/Output Summary (Last 24 hours) at 08/26/2022 1345 Last data filed at 08/26/2022 1341 Gross per 24 hour  Intake 3374.4 ml  Output 4006.5 ml  Net -632.1 ml    Filed Weights   08/24/22 1120  Weight: 85 kg    Examination:  General: middle aged female on vent critically ill appearing HENT: [place]/AT, ETT, MMM Lungs: Clear bilaterally with no wheezing Cardiovascular: RRR, no MRG Abdomen: Wound vac in place.  Extremities: No acute deformity. Developing some anasarca in the lower extremities.  Neuro: Sedated but will open her eyes to voice.  Not following commands. GU: Foley with clear urine.  Ancillary tests personally reviewed  Acceptable ABG on current settings Improving creatinine to 1.58 Improving hypovolemic hyponatremia 133 Leukocytosis has resolved 7.9 Mild anemia of acute illness 10.2  Assessment & Plan:   Septic Shock secondary to peritonitis and retroperineal pelvic absces Pneumoperitoneum s/p ex lap with portion of small bowel resection Retroperineal Pelvic Abscess Acute hypoxic respiratory failure due to septic shock from intra-abdominal infection Acute Kidney Injury Hyponatremia Hypomagnesemia Hypothyroid:  Plan:  -Significantly improved hemodynamics.  Improving renal function.  Hemodynamically optimized for operation today -Currently acceptable gas exchange on relatively minimal settings.  Will commence weaning once have a better sense of future operative plans -Continue current volume  management strategy.  Monitor creatinine.   Best Practice (right click and "Reselect all SmartList Selections" daily)   Diet/type: NPO timing of nutrition initiation per general surgery DVT prophylaxis: SCD-start chemical prophylaxis GI prophylaxis: PPI Lines: N/A right femoral art line,  right femoral triple-lumen Foley:  Yes, and it is still needed Code Status:  full code Last date of multidisciplinary goals of care discussion [updated patient's husband at bedside 7/2]  CRITICAL CARE Performed by: Lynnell Catalan   Total critical care time: 35 minutes  Critical care time was exclusive of separately billable procedures and treating other patients.  Critical care was necessary to treat or prevent imminent or life-threatening deterioration.  Critical care was time spent personally by me on the following activities: development of treatment plan with patient and/or surrogate as well as nursing, discussions with consultants, evaluation of patient's response to treatment, examination of patient, obtaining history from patient or surrogate, ordering and performing treatments and interventions, ordering and review of laboratory studies, ordering and review of radiographic studies, pulse oximetry, re-evaluation of patient's condition and participation in multidisciplinary rounds.  Lynnell Catalan, MD [facility] ICU Physician [facility] [place] [facility]  Pager: [phone] Mobile: [phone] After hours: [phone].

## 2022-08-26 NOTE — Progress Notes (Signed)
Patient to OR via ICU bed and anesthesia.

## 2022-08-26 NOTE — Transfer of Care (Signed)
Immediate Anesthesia Transfer of Care Note  Patient: Brittney Herrera  Procedure(s) Performed: EXPLORATORY LAPAROTOMY, WASHOUT, SMALL BOWEL ANASTOMOSIS, HARTMANN'S RESECTION, TAKE DOWN SPLENIC FLEXURE (Abdomen)  Patient Location: ICU  Anesthesia Type:General  Level of Consciousness: patient cooperative and Patient remains intubated per anesthesia plan  Airway & Oxygen Therapy: Patient remains intubated per anesthesia plan and Patient placed on Ventilator (see vital sign flow sheet for setting)  Post-op Assessment: Report given to RN and Post -op Vital signs reviewed and stable  Post vital signs: Reviewed and stable  Last Vitals:  Vitals Value Taken Time  BP    Temp 36.1 C 08/26/22 1513  Pulse 85 08/26/22 1513  Resp 30 08/26/22 1513  SpO2 90 % 08/26/22 1513  Vitals shown include unvalidated device data.  Last Pain:  Vitals:   08/26/22 1200  TempSrc: Esophageal  PainSc:          Complications: No notable events documented.

## 2022-08-26 NOTE — Progress Notes (Signed)
Patient rec'd from OR.  Patient with Propofol infusing in right groin.  Decreased to 50 mcg.  No Fentanyl infusing.  New bag hung and infusing.  JP drained emptied see flowsheet.  Pt. Remains sedated.  Dressing C/D/I to mid abdomen.  LUQ colostomy intact.  VSS.  Will continue to monitor.

## 2022-08-26 NOTE — Op Note (Signed)
08/26/2022  3:04 PM  PATIENT:  Brittney Herrera  47 y.o. female  Patient Care Team: Linus Galas, NP as PCP - General Dr. Casimer Lanius (Rheumatology)  PRE-OPERATIVE DIAGNOSIS:  Open abdomen, history of peritonitis and retroperitoneal abscess  POST-OPERATIVE DIAGNOSIS:    PROCEDURE:   Exploratory laparotomy with Hartmann's procedure (sigmoidectomy, end colostomy) Small bowel resection/anastomosis (re-establish continuity)  Drainage of intra-abdominal abscess - Left upper quadrant Takedown of splenic flexure Abdominal washout  SURGEON:  Stephanie Coup. Cliffton Asters, MD  ASSISTANT:  Gaynelle Adu, MD Almond Lint, MD  ANESTHESIA:   general  COUNTS:  Sponge, needle and instrument counts were reported correct x2 at the conclusion of the operation.  EBL: 100 mL  DRAINS: 19 Fr round blake drain left draining pelvic abscess cavity  SPECIMEN:  Rectosigmoid colon - stitch on proximal staple line  COMPLICATIONS: None  FINDINGS: Healthy small bowel in discontinuity.  Small bowel anastomosis fashion.  Dense fibrosis in the pelvis which appears to be adherent to both the left pelvic sidewall as well as potentially the vicinity of the bladder.  No obvious hole was identified.  A Hartman's type resection was however carried out given the very high suspicion this is all related to some sort of diverticular process.  DISPOSITION: PACU in satisfactory condition  DESCRIPTION: The patient was identified in ICU and transported directly to the OR where she was transferred to the OR table in the intubated/sedated state. SCDs were in place.  Arms were positioned out in the supine position and pressure points were evaluated and padded. General anesthesia was induced without difficulty.  The top layer of the ABThera VAC was taken off and the foam sponge removed.  She was then prepped and draped in the usual sterile fashion. A surgical timeout was performed indicating the correct patient, procedure,  positioning and need for preoperative antibiotics which are already being administered.   The ABThera inner lining was removed and discarded.  A Bookwalter retractor was placed.  We then began by systematically evaluate the abdomen.  There is a drain emanating from the deep pelvis where the rectum appears to have been mobilized/the abscess was occupying extending all the way down the presacral space to the deep pelvis.  Scant purulent contents in this cavity but no obvious perforation visible.  The proximal rectum is densely adherent to the left pelvic sidewall.   The small bowel was then run from the ligament of Treitz all the way down to the ileocecal valve.  Small bowel repair identified on the proximal jejunum that appears intact without any leakage.  There is 1 area of small bowel in discontinuity with healthy appearance.  The remainder of the small bowel is normal.  The cecum and ascending colon are normal in appearance.  The transverse colon is normal in appearance.  The descending colon is normal in appearance.  Attention is then directed at first beginning with the pelvis.  It is pretty apparent based on the degree of contamination, inflammation, and her presentation, that the most likely source of this was some sort of diverticulitis type picture as opposed to any sort of gynecologic issue.  The colon is densely inflamed in appearance at the level of the proximal rectum.  It is also adherent to the left pelvic sidewall.  The quality of the tissues is such that there is significant woody edema and acute inflammation on top of this.  We began first by mobilized the descending colon and this was done all the way up  to the level of the splenic flexure.  She has a somewhat foreshortened mesentery and therefore we opted to go ahead and take down the splenic flexure to facilitate ultimately a descending colostomy type diversion.  The splenic flexure was fully mobilized and omentum was also taken down from  its attachments to the distal transverse colon.  In doing this, we did encounter an approximate 3 x 3 cm purulent containing abscess in the left upper quadrant that was drained.  In doing this, the entire splenic flexure has been released.  The descending colon is now fully mobilized.  We approached the sigmoid.  The proximal and mid portions of the sigmoid were able to be freed from their attachments along the left pelvic sidewall staying in a plane well above the gonadal's and ureter.  The proximalmost aspect of her rectum at the rectosigmoid junction is quite adherent to the left pelvic sidewall.  There is substantial fibrosis of the retroperitoneum at this level and also precludes safe identification of the left ureter.  For these reasons, we opted to go just proximal to this at a level where the colon is somewhat more supple.  This is the distalmost aspect of the sigmoid.  Were able to create a window in the mesentery at this level.  This is then divided with a contour green load stapler.  We then worked back to a level of the descending colon where it is clearly healthy and supple in appearance.  A window was created mesentery at this level.  This is also then divided with a contour green load stapler.  The intervening mesentery is then ligated and divided using the hand-held LigaSure device.  The cut edge of the mesentery has some oozing near the superior hemorrhoidal region and this is controlled with 2 silk figure-of-eight sutures.  The pelvis and left lower quadrant are irrigated.  Hemostasis is verified.  The specimen was marked with a stitch on the proximal side and passed off.  With the splenic flexure having been fully mobilized, we did not feel at this point that it would be feasible for her to have a mid to proximal descending colostomy fashioned.  The point where this reached easiest was in her left upper quadrant.  A wheal of skin is excised this location.  The subcutaneous tissue was then  divided with electrocautery.  Traction was placed on the rectus fascia using Kocher clamps.  The rectus muscle was incised in a partial cruciate manner and the underlying rectus muscle spread.  There was significant scarring here likely related to her previous mesh.  With a laparotomy sponge in the abdomen, we carefully went through the scar and incised the peritoneum.  The descending colon was then brought through this after the os was finger dilated to approximately 2 fingerbreadths in size.  The colon was brought up to the level of skin and noted to be pink and healthy in appearance with a palpable pulse in the mesentery going out to this level.  Attention is then directed at the small bowel anastomosis.  Both ends the small bowel again are healthy in appearance.  Orientation is confirmed such there is no twisting.  Enterotomies were created on both the proximal and distal limbs and a side-to-side, functional end-to-end small bowel anastomosis is fashioned using a GIA 75 blue load stapler.  The staple line was inspected noted to have well-formed staples and hemostatic.  The common enterotomy was then elevated with Allis clamps and closed using a TA  60 blue load stapler.  The corners of each respective TA staple line was then dunked using 2-0 silk suture.  3-0 silk suture was placed at the apex of the anastomosis.  The mesenteric defect was then closed using 3-0 silk sutures.  The anastomosis is palpated to be widely patent, 3 fingerbreadths in diameter.  This is placed back into the abdomen.  The abdomen is then copiously irrigated with warm saline.  Laparotomy pad counts were correct.  We directed our attention abdominal closure.  Using 2 running #1 PDS sutures, were able to close the fascia with the sutures being tied centrally.  We then reviewed with anesthesia that the peak pressures were adequate and the remained normal, unchanged prior to start of the case.  All sponge, needle, and instrument counts  were then reported correct.  Given the degree of contamination, we opted to leave the skin open.  A moist Kerlix was placed followed by ABD pads and tape.  Attention was directed to maturing the colostomy.  The staple line was excised and the descending colon was matured to the skin in a partial Brooking manner.  The colon is pink and well-perfused in appearance.  It is gently digitized and noted to be completely patent to the level of the fascia.  An ostomy appliance was then cut to fit.  Given preoperative discussions, plans were already in place for her to remain intubated.  She was then transferred to a stretcher for transfer back to the intensive care unit in critical but stable condition.

## 2022-08-26 NOTE — Plan of Care (Signed)
  Problem: Education: Goal: Knowledge of General Education information will improve Description: Including pain rating scale, medication(s)/side effects and non-pharmacologic comfort measures Outcome: Progressing   Problem: Clinical Measurements: Goal: Ability to maintain clinical measurements within normal limits will improve Outcome: Progressing Goal: Diagnostic test results will improve Outcome: Progressing Goal: Respiratory complications will improve Outcome: Progressing Goal: Cardiovascular complication will be avoided Outcome: Progressing   Problem: Coping: Goal: Level of anxiety will decrease Outcome: Progressing   Problem: Elimination: Goal: Will not experience complications related to urinary retention Outcome: Progressing   Problem: Pain Managment: Goal: General experience of comfort will improve Outcome: Progressing   Problem: Safety: Goal: Ability to remain free from injury will improve Outcome: Progressing   Problem: Skin Integrity: Goal: Risk for impaired skin integrity will decrease Outcome: Progressing   

## 2022-08-26 NOTE — Anesthesia Postprocedure Evaluation (Signed)
Anesthesia Post Note  Patient: Kerrilynn Curatola  Procedure(s) Performed: EXPLORATORY LAPAROTOMY, WASHOUT, SMALL BOWEL ANASTOMOSIS, HARTMANN'S RESECTION, TAKE DOWN SPLENIC FLEXURE (Abdomen)     Patient location during evaluation: SICU Anesthesia Type: General Level of consciousness: sedated Pain management: pain level controlled Vital Signs Assessment: post-procedure vital signs reviewed and stable Respiratory status: patient remains intubated per anesthesia plan Cardiovascular status: stable Postop Assessment: no apparent nausea or vomiting Anesthetic complications: no  No notable events documented.  Last Vitals:  Vitals:   08/26/22 1615 08/26/22 1630  BP:    Pulse: 79 80  Resp: (!) 30 (!) 30  Temp: (!) 36.3 C (!) 36.4 C  SpO2: 91% 92%    Last Pain:  Vitals:   08/26/22 1600  TempSrc: Esophageal  PainSc:                  Daanish Copes,W. EDMOND

## 2022-08-27 ENCOUNTER — Encounter (HOSPITAL_COMMUNITY): Payer: Self-pay | Admitting: Surgery

## 2022-08-27 ENCOUNTER — Other Ambulatory Visit: Payer: Self-pay

## 2022-08-27 DIAGNOSIS — K668 Other specified disorders of peritoneum: Secondary | ICD-10-CM | POA: Diagnosis not present

## 2022-08-27 LAB — CULTURE, BLOOD (ROUTINE X 2): Special Requests: ADEQUATE

## 2022-08-27 LAB — COMPREHENSIVE METABOLIC PANEL
ALT: 20 U/L (ref 0–44)
AST: 25 U/L (ref 15–41)
Albumin: 1.8 g/dL — ABNORMAL LOW (ref 3.5–5.0)
Alkaline Phosphatase: 43 U/L (ref 38–126)
Anion gap: 7 (ref 5–15)
BUN: 18 mg/dL (ref 6–20)
CO2: 24 mmol/L (ref 22–32)
Calcium: 7.1 mg/dL — ABNORMAL LOW (ref 8.9–10.3)
Chloride: 102 mmol/L (ref 98–111)
Creatinine, Ser: 1.11 mg/dL — ABNORMAL HIGH (ref 0.44–1.00)
GFR, Estimated: >60 mL/min (ref >60)
Glucose, Bld: 147 mg/dL — ABNORMAL HIGH (ref 70–99)
Potassium: 3.4 mmol/L — ABNORMAL LOW (ref 3.5–5.1)
Sodium: 133 mmol/L — ABNORMAL LOW (ref 135–145)
Total Bilirubin: 1.7 mg/dL — ABNORMAL HIGH (ref 0.3–1.2)
Total Protein: 4.6 g/dL — ABNORMAL LOW (ref 6.5–8.1)

## 2022-08-27 LAB — CBC
HCT: 30.3 % — ABNORMAL LOW (ref 36.0–46.0)
Hemoglobin: 9.9 g/dL — ABNORMAL LOW (ref 12.0–15.0)
MCH: 32.5 pg (ref 26.0–34.0)
MCHC: 32.7 g/dL (ref 30.0–36.0)
MCV: 99.3 fL (ref 80.0–100.0)
Platelets: 280 10*3/uL (ref 150–400)
RBC: 3.05 MIL/uL — ABNORMAL LOW (ref 3.87–5.11)
RDW: 15.9 % — ABNORMAL HIGH (ref 11.5–15.5)
WBC: 10 10*3/uL (ref 4.0–10.5)
nRBC: 0.2 % (ref 0.0–0.2)

## 2022-08-27 LAB — POCT I-STAT 7, (LYTES, BLD GAS, ICA,H+H)
Acid-base deficit: 2 mmol/L (ref 0.0–2.0)
Bicarbonate: 23 mmol/L (ref 20.0–28.0)
Calcium, Ion: 1 mmol/L — ABNORMAL LOW (ref 1.15–1.40)
HCT: 28 % — ABNORMAL LOW (ref 36.0–46.0)
Hemoglobin: 9.5 g/dL — ABNORMAL LOW (ref 12.0–15.0)
O2 Saturation: 99 %
Patient temperature: 37.5
Potassium: 3.3 mmol/L — ABNORMAL LOW (ref 3.5–5.1)
Sodium: 138 mmol/L (ref 135–145)
TCO2: 24 mmol/L (ref 22–32)
pCO2 arterial: 39.4 mmHg (ref 32–48)
pH, Arterial: 7.376 (ref 7.35–7.45)
pO2, Arterial: 135 mmHg — ABNORMAL HIGH (ref 83–108)

## 2022-08-27 LAB — MAGNESIUM: Magnesium: 2.7 mg/dL — ABNORMAL HIGH (ref 1.7–2.4)

## 2022-08-27 LAB — GLUCOSE, CAPILLARY
Glucose-Capillary: 151 mg/dL — ABNORMAL HIGH (ref 70–99)
Glucose-Capillary: 135 mg/dL — ABNORMAL HIGH (ref 70–99)
Glucose-Capillary: 113 mg/dL — ABNORMAL HIGH (ref 70–99)
Glucose-Capillary: 144 mg/dL — ABNORMAL HIGH (ref 70–99)

## 2022-08-27 LAB — PHOSPHORUS: Phosphorus: 2.6 mg/dL (ref 2.5–4.6)

## 2022-08-27 LAB — TRIGLYCERIDES: Triglycerides: 493 mg/dL — ABNORMAL HIGH (ref <150)

## 2022-08-27 MED ORDER — SODIUM CHLORIDE 0.9% FLUSH: 10.0000 mL | INTRAVENOUS | Status: DC | PRN

## 2022-08-27 MED ORDER — ACETAMINOPHEN 10 MG/ML IV SOLN
1000.0000 mg | Freq: Three times a day (TID) | INTRAVENOUS | Status: AC
  Administered 2022-08-27 – 2022-08-28 (×3): 1000 mg via INTRAVENOUS
  Filled 2022-08-27 (×3): qty 100

## 2022-08-27 MED ORDER — POTASSIUM CHLORIDE 10 MEQ/50ML IV SOLN
10.0000 meq | INTRAVENOUS | Status: AC
  Administered 2022-08-27 (×2): 10 meq via INTRAVENOUS
  Filled 2022-08-27 (×2): qty 50

## 2022-08-27 MED ORDER — SIMETHICONE 80 MG PO CHEW: 80.0000 mg | CHEWABLE_TABLET | Freq: Four times a day (QID) | ORAL | Status: DC | PRN

## 2022-08-27 MED ORDER — GABAPENTIN 250 MG/5ML PO SOLN
300.0000 mg | Freq: Three times a day (TID) | ORAL | Status: DC
  Administered 2022-08-27 – 2022-08-30 (×10): 300 mg
  Filled 2022-08-27 (×13): qty 6

## 2022-08-27 MED ORDER — DEXAMETHASONE SODIUM PHOSPHATE 4 MG/ML IJ SOLN
4.0000 mg | Freq: Once | INTRAMUSCULAR | Status: AC
  Administered 2022-08-27: 4 mg via INTRAVENOUS
  Filled 2022-08-27: qty 1

## 2022-08-27 MED ORDER — OXYCODONE HCL 5 MG PO TABS
10.0000 mg | ORAL_TABLET | ORAL | Status: DC | PRN
  Administered 2022-08-28 – 2022-08-30 (×6): 10 mg
  Filled 2022-08-27 (×6): qty 2

## 2022-08-27 MED ORDER — SODIUM CHLORIDE 0.9% FLUSH
10.0000 mL | Freq: Two times a day (BID) | INTRAVENOUS | Status: DC
  Administered 2022-08-27: 30 mL
  Administered 2022-08-28: 10 mL
  Administered 2022-08-28 – 2022-08-29 (×2): 30 mL
  Administered 2022-08-29 – 2022-08-30 (×2): 10 mL
  Administered 2022-08-30: 30 mL
  Administered 2022-09-02 – 2022-09-07 (×10): 10 mL

## 2022-08-27 MED ORDER — TRAVASOL 10 % IV SOLN
INTRAVENOUS | Status: AC
  Filled 2022-08-27: qty 496.8

## 2022-08-27 MED ORDER — METHOCARBAMOL 1000 MG/10ML IJ SOLN
500.0000 mg | Freq: Three times a day (TID) | INTRAVENOUS | Status: DC | PRN
  Administered 2022-08-27: 500 mg via INTRAVENOUS
  Filled 2022-08-27: qty 500
  Filled 2022-08-27: qty 5

## 2022-08-27 MED ORDER — PROCHLORPERAZINE EDISYLATE 10 MG/2ML IJ SOLN: 10.0000 mg | INTRAMUSCULAR | Status: DC | PRN

## 2022-08-27 MED ORDER — ORAL CARE MOUTH RINSE
15.0000 mL | OROMUCOSAL | Status: DC
  Administered 2022-08-28 – 2022-09-08 (×32): 15 mL via OROMUCOSAL

## 2022-08-27 MED ORDER — DOCUSATE SODIUM 50 MG/5ML PO LIQD
100.0000 mg | Freq: Two times a day (BID) | ORAL | Status: DC
  Administered 2022-08-27 – 2022-09-02 (×8): 100 mg
  Filled 2022-08-27 (×10): qty 10

## 2022-08-27 MED ORDER — SODIUM CHLORIDE 0.9 % IV SOLN: INTRAVENOUS | Status: DC | PRN

## 2022-08-27 MED ORDER — OXYCODONE HCL 5 MG PO TABS: 5.0000 mg | ORAL_TABLET | ORAL | Status: DC | PRN

## 2022-08-27 MED ORDER — KETOROLAC TROMETHAMINE 15 MG/ML IJ SOLN
15.0000 mg | Freq: Three times a day (TID) | INTRAMUSCULAR | Status: DC
  Administered 2022-08-27 – 2022-08-31 (×11): 15 mg via INTRAVENOUS
  Filled 2022-08-27 (×12): qty 1

## 2022-08-27 MED ORDER — DIAZEPAM 5 MG/ML IJ SOLN
5.0000 mg | Freq: Four times a day (QID) | INTRAMUSCULAR | Status: DC | PRN
  Administered 2022-08-27 – 2022-09-08 (×17): 5 mg via INTRAVENOUS
  Filled 2022-08-27 (×17): qty 2

## 2022-08-27 MED ORDER — HYDROMORPHONE HCL 1 MG/ML IJ SOLN
1.0000 mg | INTRAMUSCULAR | Status: DC | PRN
  Administered 2022-08-27 – 2022-08-31 (×23): 1 mg via INTRAVENOUS
  Filled 2022-08-27 (×24): qty 1

## 2022-08-27 NOTE — Progress Notes (Signed)
RN verified with E-link and surgery MD about keeping a fentanyl infusion on an extubated patient. MD made aware patient is in severe pain. Patient is alert. Oxygen saturation currently 94% on 2 liters [place], breathing 15 times a minute. Dr. Dossie Der stated he would be putting in new orders.

## 2022-08-27 NOTE — Progress Notes (Signed)
PCCM Update:  Patient tolerated PSV trial and was extubated on of fentanyl. She complains of 9/10 pain despite being on continuous fentanyl She did not have cuff leak per RT, dose of decadron 4mg  IV given prior to extubation. No stridor post extubation.  Patients family updated at bedside.  Plan: - start scheduled IV tylenol 1g q8 hours - wean fentanyl as able, bolus PRN - Consider PCA pump - add robaxin IV PRN - remove right fem a-line and CVL now that PICC is in place  35 minutes CC time  Melody Comas, MD [facility] Office: [phone]   See Amion for personal pager PCCM on call pager [phone] until 7pm. Please call Elink 7p-7a. [phone]

## 2022-08-27 NOTE — TOC CM/SW Note (Signed)
Transition of Care [facility]) - Inpatient Brief Assessment   Patient Details  Name: Shyera Taneja MRN: [id number] Date of Birth: 1975-11-01  Transition of Care [facility]) CM/SW Contact:    Glennon Mac, RN Phone Number: 08/27/2022, 4:49 PM   Clinical Narrative: Pt admitted on 08/23/20 with pneumoperitoneum, pelvic abscess and hypoxic resp failure.  She is s/p exp lap with SBR, end colostomy and small bowel re-anastomosis on 7/2 and 7/4.  She remains intubated, on pressors and TPN.  TOC will continue to follow as patient progresses.    Transition of Care Asessment: Insurance and Status: Insurance coverage has been reviewed Patient has primary care physician: Yes Linus Galas) Home environment has been reviewed: Lives with spouse Prior level of function:: Independent Prior/Current Home Services: No current home services Social Determinants of Health Reivew: SDOH reviewed no interventions necessary Readmission risk has been reviewed: Yes Transition of care needs: transition of care needs identified, TOC will continue to follow  Quintella Baton, RN, BSN  Trauma/Neuro ICU Case Manager [phone]

## 2022-08-27 NOTE — Progress Notes (Signed)
Nutrition Follow-up  DOCUMENTATION CODES:   Obesity unspecified  INTERVENTION:   TPN management per Pharmacy - to meet 100% of patient's nutrition needs TPN started 7/4 meeting 50% of needs  Once able to transition to enteral nutrition support recommend Pivot 1.5 @ 20 ml/hr and increase by 10 ml every 8 hours to goal rate  Goal: Pivot 1.5 @ 55 ml/hr  Provides: 1980 kcal, 123 grams protein, and 1003 ml free water   NUTRITION DIAGNOSIS:   Inadequate oral intake related to acute illness as evidenced by NPO status. Ongoing.   GOAL:    (Meet 100% of nutrition needs via Parenteral Nutrition Support) Progressing with TPN initiation   MONITOR:   Vent status, Labs, Weight trends, I & O's  REASON FOR ASSESSMENT:   Ventilator    ASSESSMENT:   Pt admitted with pneumoperitoneum. PMH significant for pelvic mass, s/p attempted resection (2022), and open incisional hernia repair (2023).  Pt discussed during ICU rounds and with RN.   07/04 - s/p ex lap with Hartmann's procedure (sigmoidectomy with end colostomy), SBR with anastomosis which re-established continuity, drainage of intra-abd abscess in LU quadrant, takedown of splenic flexure, abd washout TPN started  Medications reviewed and include: SSI, IV synthroid, IV protonix Fentanyl  Levophed @ 3 mcg 10 KCl x 2 Propofol @ 25 ml/hr provides: 660 kcal  TPN @ 45 ml/hr   Goal with propofol: 90 ml/hr provides 1280 kcal and 99 grams protein   Labs reviewed:  Na 133 K 3.4 PO4 2.6 Magnesium: 2.7 TG: 493 CBG: 132-151  14 F OG tube; tip within stomach with 155 ml out UOP: 2270 ml  1 R JP: 682 ml Abd VAC: removed I&O: +8.8 L   +edema Current weight: 96.1 kg Admission weight: 85 kg  Diet Order:   Diet Order             Diet NPO time specified  Diet effective now                   EDUCATION NEEDS:   No education needs have been identified at this time  Skin:  Skin Assessment: Skin Integrity Issues: Skin  Integrity Issues:: Incisions Wound Vac: removed Incisions: abd  Last BM:  nothing via colostomy yet  Height:   Ht Readings from Last 1 Encounters:  08/24/22 5\' 3"  (1.6 m)    Weight:   Wt Readings from Last 1 Encounters:  08/27/22 96.1 kg    Ideal Body Weight:  52.3 kg  BMI:  Body mass index is 37.53 kg/m.  Estimated Nutritional Needs:   Kcal:  1900-2100  Protein:  105-125g  Fluid:  >/=1.9L  Benna Arno P., RD, LDN, CNSC See AMiON for contact information

## 2022-08-27 NOTE — Consult Note (Signed)
WOC Nurse Consult Note: Reason for Consult:Midline NPWT placed 08/27/22.  Wound type:Surgical Pressure Injury POA: N/A  WOC Nurse ostomy consult note Stoma type/location: LLQ Colostomy created intraoperatively on 08/26/22 by Dr Cliffton Asters.   Consult received for co management of NPWT and LLQ colostomy.  WOC Nurses will begin care on Monday, July 8.  WOC nursing team will follow, and will remain available to this patient, the nursing and medical teams.    Thank you for inviting [place] to participate in this patient's Plan of Care.  Ladona Mow, MSN, RN, CNS, GNP, Leda Min, [employer], [employer] phone:  [phone]

## 2022-08-27 NOTE — Progress Notes (Addendum)
PHARMACY - TOTAL PARENTERAL NUTRITION CONSULT NOTE   Indication:  bowel discontinuity  Patient Measurements: Height: 5\' 3"  (160 cm) Weight: 96.1 kg (211 lb 13.8 oz) IBW/kg (Calculated) : 52.4 TPN AdjBW (KG): 60.6 Body mass index is 37.53 kg/m. Usual Weight: 85 kg  Assessment:   47 y.o. F with history of a known pelvic mass that is follow by Dr. Pricilla Holm with [facility].  She underwent attempted resection in 2022 but given this was so adhered to multiple surrounding structures, it was unable to be removed. Biopsies were obtained at this time and this was not found to be malignant. Her last MRI revealed this was smaller in size. She reports being in her normal state of health until Saturday when she developed an acute onset of pelvic cramping and pain. Admitted for septic shock, started on Zosyn. Leukocytosis now resolved, improved hemodynamics. Renal function improving. Anticipated prolonged NPO status per Surgery. Pharmacy consulted to initiate TPN.   Glucose / Insulin: no hx DM, CBGs<180, 4u SSI/24h  Electrolytes: K 3.4, Na 133, phos 2.6, Mg 2.7 (s/p 4g IV Mg 7/03), CoCa 8.9 Renal: Scr 1.11 (bsl <1), BUN 18 Hepatic: LFTs wnl, tbili 1.7, Alb 1.8  Hx hypertriglyceridemia on propofol, daily TG ordered, TG up 377 > 442 > 493  Intake / Output; MIVF: UOP 1 mL/kg/hr, emesis/NG output 155 mL, drains 780 mL  GI Imaging: 7/02 CT abd: Pneumomediastinum extending into the lower neck. There is pneumoperitoneum in the upper abdomen suggesting possible bowel perforation. Fatty liver. GI Surgeries / Procedures:  7/02 OR for ex-lap, area of small bowel resection - left in discontinuity with open abdomen 7/04 return to OR for washout +/- re-anastomosis   Central access: 7/03 TPN start date: 7/04 PM  Nutritional Goals: Goal TPN rate is 90 mL/hr (provides 99 g of protein and 1280 kcals per day + 660 kcals from propofol per day)  RD Assessment: Estimated Needs Total Energy Estimated Needs:  1900-2100 Total Protein Estimated Needs: 105-125g Total Fluid Estimated Needs: >/=1.9L  Current Nutrition:  NPO  Plan:  Start TPN at 45 mL/hr at 1800 (to meet 50% total nutritional needs) > Propofol @ 25 ml/hr provides 660 kcal, ~35% total kcal needs daily. No lipids in TPN  Electrolytes in TPN: Na 60mEq/L, K 35 mEq/L, Ca 63mEq/L, Mg 0 mEq/L, Phos 10 mmol/L. Cl:Ac 1:1 -Kcl x2 IV ordered by provider overnight Add standard MVI and trace elements to TPN, chromium held  Initiate Sensitive q6h SSI and adjust as needed  Monitor TPN labs on Mon/Thurs, daily as needed Daily TG due to history of hypertriglyceridemia  -attempting to wean propofol down today d/t high trigs and abdomen now closed MD to order PICC to replace femoral central line  Thank you for allowing pharmacy to be a part of this patient's care.  Calton Dach, PharmD Clinical Pharmacist 08/27/2022 9:37 AM

## 2022-08-27 NOTE — Progress Notes (Signed)
RN verified with Dr. Dossie Der that per tube meds are okay to give. Dr. Dossie Der stated gastric tube is okay to give meds through.

## 2022-08-27 NOTE — Progress Notes (Signed)
eLink Physician-Brief Progress Note Patient Name: Marbella Watchman DOB: [birth year] MRN: [id number]   Date of Service  08/27/2022  HPI/Events of Note  K+ 3.4, Cr 1.11, patient is on TPN.  eICU Interventions  KCL 10 meq iv Q 1 hour x 2 doses ordered.        Migdalia Dk 08/27/2022, 6:48 AM

## 2022-08-27 NOTE — Progress Notes (Signed)
Peripherally Inserted Central Catheter Placement  The IV Nurse has discussed with the patient and/or persons authorized to consent for the patient, the purpose of this procedure and the potential benefits and risks involved with this procedure.  The benefits include less needle sticks, lab draws from the catheter, and the patient may be discharged home with the catheter. Risks include, but not limited to, infection, bleeding, blood clot (thrombus formation), and puncture of an artery; nerve damage and irregular heartbeat and possibility to perform a PICC exchange if needed/ordered by physician.  Alternatives to this procedure were also discussed.  Bard Power PICC patient education guide, fact sheet on infection prevention and patient information card has been provided to patient /or left at bedside.  Consent obtained at bedside from husband due to altered mental status. PICC placed by Lazarus Gowda, RN   PICC Placement Documentation  PICC Triple Lumen 08/27/22 Right Basilic 39 cm 1 cm (Active)  Indication for Insertion or Continuance of Line Administration of hyperosmolar/irritating solutions (i.e. TPN, Vancomycin, etc.) 08/27/22 1603  Exposed Catheter (cm) 1 cm 08/27/22 1603  Site Assessment Clean, Dry, Intact 08/27/22 1603  Lumen #1 Status Flushed;Saline locked;Blood return noted 08/27/22 1603  Lumen #2 Status Flushed;Saline locked;Blood return noted 08/27/22 1603  Lumen #3 Status Flushed;Saline locked;Blood return noted 08/27/22 1603  Dressing Type Transparent;Securing device 08/27/22 1603  Dressing Status Antimicrobial disc in place;Clean, Dry, Intact 08/27/22 1603  Safety Lock Not Applicable 08/27/22 1603  Line Care Connections checked and tightened 08/27/22 1603  Dressing Intervention New dressing 08/27/22 1603  Dressing Change Due 09/03/22 08/27/22 1603       Brittney Herrera, [name] 08/27/2022, 4:04 PM

## 2022-08-27 NOTE — Consult Note (Signed)
NAME:  Brittney Herrera, MRN:  [id number], DOB:  Mar 23, 1975, LOS: 3 ADMISSION DATE:  08/24/2022, CONSULTATION DATE:  08/24/22 REFERRING MD:  CCS CHIEF COMPLAINT:  Shock   History of Present Illness:  Brittney Herrera is 47 year old woman who presented today for abdominal pain found to have free air in the retroperitoneum of the pelvis extending into the mediastinum based on CT imaging. She was taken to the OR for exploratory laparotomy with findings notable for peritonitis, retroperitoneal abscess with a section of small bowel with serosal injury requiring resection. Her appendix was also resected due to dense adhesions to the right adnexa. The small bowel became distended throughout the procedure and she was left in discontinuity. She developed increasing vasopressor needs as well. Her abdomen was left open.  A 19 french drain was placed down in the retroperitoneal abscess space. A abdominal wound vac was put in place. The patient remained intubated and on neosynephrine. She was transferred to the ICU.   PCCM consulted for medical management.   Pertinent  Medical History   Past Medical History:  Diagnosis Date   Arthritis    Bicornuate uterus    Hemorrhoids 11/18/2020   History of abnormal cervical Pap smear    Hypertriglyceridemia 11/18/2020   Hypothyroidism    Rheumatoid aortitis    Thyroid disease    Umbilical hernia 11/28/2020   Significant Hospital Events: Including procedures, antibiotic start and stop dates in addition to other pertinent events   7/2 admitted, OR for ex-lap, area of small bowel resection - left in discontinuity with open abdomen 7/3 in ICU, pressor requirements improving 7/4 returned to OR for washout, 3x3 LUQ abscess drained, end colostomy, small bowel re-anastomosis, abdomen closed  Interim History / Subjective:   Remains intubated, sedated - moving intermittently  Levophed weaned to  She is on TPN  Updated husband and mother at bedside  Objective    Blood pressure (!) 130/91, pulse 76, temperature 99.1 F (37.3 C), resp. rate (!) 30, height 5\' 3"  (1.6 m), weight 96.1 kg, SpO2 94 %.    Vent Mode: PRVC FiO2 (%):  [40 %] 40 % Set Rate:  [30 bmp] 30 bmp Vt Set:  [420 mL] 420 mL PEEP:  [5 cmH20] 5 cmH20 Plateau Pressure:  [19 cmH20-22 cmH20] 20 cmH20   Intake/Output Summary (Last 24 hours) at 08/27/2022 0958 Last data filed at 08/27/2022 0900 Gross per 24 hour  Intake 3983.96 ml  Output 3475 ml  Net 508.96 ml   Filed Weights   08/24/22 1120 08/27/22 0500  Weight: 85 kg 96.1 kg    Examination:  General: middle aged female on vent critically ill appearing HENT: [place]/AT, ETT, MMM Lungs: Course bilateral breath sounds. No wheeze Cardiovascular: RRR, no MRG Abdomen: LUQ ostomy - pink appearing, minimal brown output, mildly distended  Extremities: trace edema in legs Neuro: Sedated, PERRL, intermittently moving all extremities GU: Foley  Resolved Hospital Problem list     Assessment & Plan:   Septic Shock  - wean pressors for MAP goal 65 - Continue zosyn for intra-abdominal coverage - follow up cultures  Pneumoperitoneum s/p ex lap with portion of small bowel resection 7/2 Retroperineal Pelvic Abscess LUQ abscess drainage, End Colostomy and small bowel re-anastomosis 7/4 - Management per general surgery - Pain control with fentanyl  Acute hypoxemic respiratory failure due to septic shock - Continue mechanical ventilator supprot - Plan for sedation wean today and PSV trial  Acute Kidney Injury Hyponatremia, mild Hypokalemia Hypomagnesemia -  in setting of sepsis - monitor UOP - Continue IV fluids as above  Moderate Protein Calorie Malnutrition Hypertriglyceridemia - continue TPN - elevated TG in setting of propofol  Anemia - monitor  - transfuse for hemoglobin less than 7g/dL  Hypothyroid: - synthroid to IV   Best Practice (right click and "Reselect all SmartList Selections" daily)   Diet/type: TPN DVT  prophylaxis: SCD GI prophylaxis: PPI Lines: N/A Foley:  Yes, and it is still needed Code Status:  full code Last date of multidisciplinary goals of care discussion [updated patient's husband and mother at bedside 7/5]  Labs   CBC: Recent Labs  Lab 08/24/22 1129 08/24/22 2137 08/25/22 0416 08/25/22 1011 08/26/22 0434 08/26/22 0506 08/26/22 1408 08/27/22 0503 08/27/22 0504  WBC 8.2 5.9 14.7*  --   --  7.9  --   --  10.0  NEUTROABS 7.3 5.1 12.4*  --   --   --   --   --   --   HGB 15.3* 13.2 13.4   < > 10.5* 10.2* 9.9* 9.5* 9.9*  HCT 44.9 42.7 42.5   < > 31.0* 31.1* 29.0* 28.0* 30.3*  MCV 94.5 105.2* 100.7*  --   --  101.6*  --   --  99.3  PLT 300 317 397  --   --  266  --   --  280   < > = values in this interval not displayed.    Basic Metabolic Panel: Recent Labs  Lab 08/24/22 1129 08/24/22 2137 08/25/22 0416 08/25/22 1011 08/26/22 0434 08/26/22 0506 08/26/22 1408 08/27/22 0503 08/27/22 0504  NA 131* 130* 130*   < > 133* 133* 136 138 133*  K 3.1* 3.7 3.8   < > 3.5 3.3* 3.4* 3.3* 3.4*  CL 95* 99 99  --   --  101  --   --  102  CO2 22 20* 19*  --   --  22  --   --  24  GLUCOSE 158* 159* 178*  --   --  118*  --   --  147*  BUN 20 17 18   --   --  22*  --   --  18  CREATININE 1.06* 0.93 1.66*  --   --  1.58*  --   --  1.11*  CALCIUM 8.8* 7.3* 7.3*  --   --  6.9*  --   --  7.1*  MG  --  1.3* 2.3  --   --  2.7*  --   --  2.7*  PHOS  --   --  5.3*  --   --  5.0*  --   --  2.6   < > = values in this interval not displayed.   GFR: Estimated Creatinine Clearance: 71.4 mL/min (A) (by C-G formula based on SCr of 1.11 mg/dL (H)). Recent Labs  Lab 08/24/22 2127 08/24/22 2137 08/25/22 0413 08/25/22 0416 08/25/22 0712 08/25/22 1007 08/26/22 0506 08/27/22 0504  WBC  --  5.9  --  14.7*  --   --  7.9 10.0  LATICACIDVEN 5.0*  --  3.1*  --  3.0* 2.6*  --   --     Liver Function Tests: Recent Labs  Lab 08/24/22 1129 08/25/22 0416 08/27/22 0504  AST 31 25 25   ALT 31  20 20   ALKPHOS 129* 46 43  BILITOT 2.0* 2.1* 1.7*  PROT 7.4 5.1* 4.6*  ALBUMIN 2.9* 2.0* 1.8*   Recent Labs  Lab  08/24/22 1129 08/25/22 0416  LIPASE 15 15   No results for input(s): "AMMONIA" in the last 168 hours.  ABG    Component Value Date/Time   PHART 7.376 08/27/2022 0503   PCO2ART 39.4 08/27/2022 0503   PO2ART 135 (H) 08/27/2022 0503   HCO3 23.0 08/27/2022 0503   TCO2 24 08/27/2022 0503   ACIDBASEDEF 2.0 08/27/2022 0503   O2SAT 99 08/27/2022 0503     Coagulation Profile: No results for input(s): "INR", "PROTIME" in the last 168 hours.  Cardiac Enzymes: No results for input(s): "CKTOTAL", "CKMB", "CKMBINDEX", "TROPONINI" in the last 168 hours.  HbA1C: No results found for: "HGBA1C"  CBG: Recent Labs  Lab 08/24/22 2342 08/26/22 1803 08/26/22 2343 08/27/22 0554 08/27/22 0746  GLUCAP 138* 132* 143* 151* 135*    Critical care time: 45 minutes     Melody Comas, MD [facility] Office: [phone]   See Amion for personal pager PCCM on call pager [phone] until 7pm. Please call Elink 7p-7a. [phone]

## 2022-08-27 NOTE — Progress Notes (Signed)
[facility] Progress Note  1 Day Post-Op  Subjective: CC:  Weaning to extubation   Objective: Vital signs in last 24 hours: Temp:  [96.8 F (36 C)-99.7 F (37.6 C)] 99.1 F (37.3 C) (07/05 1118) Pulse Rate:  [67-96] 96 (07/05 1118) Resp:  [27-31] 30 (07/05 1118) BP: (80-130)/(54-91) 107/73 (07/05 1100) SpO2:  [86 %-98 %] 97 % (07/05 1119) Arterial Line BP: (93-155)/(45-87) 122/62 (07/05 1100) FiO2 (%):  [40 %] 40 % (07/05 1119) Weight:  [96.1 kg] 96.1 kg (07/05 0500) Last BM Date :  (pta)  Intake/Output from previous day: 07/04 0701 - 07/05 0700 In: 3841.6 [I.V.:3083; IV Piggyback:758.6] Out: 3807.5 [Urine:2270; Emesis/NG output:155; Drains:782.5; Blood:600] Intake/Output this shift: Total I/O In: 614.9 [I.V.:479.8; IV Piggyback:135.1] Out: 50 [Drains:50]  PE: Gen:  Alert, NAD, pleasant Card:  Regular rate and rhythm Pulm:  ventilated respirations  Abd: Soft,NPWT  NG - dark bilious effluent  JP - 700 cc, SS  Ostomy - stoma pink- some SS drainage in ostomy pouch  Midline - fascia in tact, wound bed pink.  GU: foley w/ 2,045 mL/24h, clear yellow urine Skin: warm and dry, no rashes  Psych: A&Ox3   Lab Results:  Recent Labs    08/26/22 0506 08/26/22 1408 08/27/22 0503 08/27/22 0504  WBC 7.9  --   --  10.0  HGB 10.2*   < > 9.5* 9.9*  HCT 31.1*   < > 28.0* 30.3*  PLT 266  --   --  280   < > = values in this interval not displayed.   BMET Recent Labs    08/26/22 0506 08/26/22 1408 08/27/22 0503 08/27/22 0504  NA 133*   < > 138 133*  K 3.3*   < > 3.3* 3.4*  CL 101  --   --  102  CO2 22  --   --  24  GLUCOSE 118*  --   --  147*  BUN 22*  --   --  18  CREATININE 1.58*  --   --  1.11*  CALCIUM 6.9*  --   --  7.1*   < > = values in this interval not displayed.   PT/INR No results for input(s): "LABPROT", "INR" in the last 72 hours. CMP     Component Value Date/Time   NA 133 (L) 08/27/2022 0504   K 3.4 (L) 08/27/2022 0504   CL 102  08/27/2022 0504   CO2 24 08/27/2022 0504   GLUCOSE 147 (H) 08/27/2022 0504   BUN 18 08/27/2022 0504   CREATININE 1.11 (H) 08/27/2022 0504   CALCIUM 7.1 (L) 08/27/2022 0504   PROT 4.6 (L) 08/27/2022 0504   ALBUMIN 1.8 (L) 08/27/2022 0504   AST 25 08/27/2022 0504   ALT 20 08/27/2022 0504   ALKPHOS 43 08/27/2022 0504   BILITOT 1.7 (H) 08/27/2022 0504   GFRNONAA >60 08/27/2022 0504   Lipase     Component Value Date/Time   LIPASE 15 08/25/2022 0416       Studies/Results: No results found.  Anti-infectives: Anti-infectives (From admission, onward)    Start     Dose/Rate Route Frequency Ordered Stop   08/24/22 2200  piperacillin-tazobactam (ZOSYN) IVPB 3.375 g        3.375 g 12.5 mL/hr over 240 Minutes Intravenous Every 8 hours 08/24/22 1635 08/31/22 2159   08/24/22 1430  piperacillin-tazobactam (ZOSYN) IVPB 3.375 g        3.375 g 100 mL/hr over 30 Minutes Intravenous  Once 08/24/22  1423 08/24/22 1608        Assessment/Plan  Pneumoperitoneum, retroperitoneal air, mediastinal air likely secondary to perforated rectosigmoid colon with history of pelvic mass  7/2: ex lap extensive LOA, SBR in discontinuity, placement of pelvic drain, placement of ABThera vacuum dressing, Dr. Corliss Skains   7/4: ex lap, hartmann's procedure,  washout, small bowel anastomosis (re-establish continuity), drainage LUQ IAA, abdominal closure, Dr. Cliffton Asters -  POD#3, POD#1  - continue NG to LIWS and await bowel function - WOC following for new colostomy - NPWT ordered to midline M/W/F -cont abx therapy -cont JP drain -daily labs - TPN anticipate ileus   FEN - NPO/NGT/IVFs/pressors/bowel discontinuity VTE - lovenox ID - zosyn   AKI - improving Septic shock - secondary to above, appreciate CCM assistance Post op respiratory failure - Weaned to extubation today, appreciate CCM assistance - adjust pain medications Hypokalemia - continue fluids, monitor Hyponatremia - improved Hypothyroidism - IV  synthroid daily Anxiety  RA   LOS: 3 days    Note signed by me in assistance as scribe for day team who saw the patient earlier today Bailey Mech and Dr. Andrey Campanile.

## 2022-08-27 NOTE — Anesthesia Postprocedure Evaluation (Signed)
Anesthesia Post Note  Patient: Brittney Herrera  Procedure(s) Performed: EXPLORATORY LAPAROTOMY, SMALL BOWEL RESECTION AND REMOVAL OF APPENDIX     Patient location during evaluation: ICU Anesthesia Type: General Level of consciousness: sedated Pain management: pain level controlled Vital Signs Assessment: post-procedure vital signs reviewed and stable Respiratory status: patient remains intubated per anesthesia plan Cardiovascular status: stable Postop Assessment: no apparent nausea or vomiting Anesthetic complications: no   No notable events documented.  Last Vitals:  Vitals:   08/27/22 0615 08/27/22 0630  BP:  94/66  Pulse: 76 74  Resp: (!) 30 (!) 30  Temp: 37.4 C 37.5 C  SpO2: 97% 97%    Last Pain:  Vitals:   08/26/22 1600  TempSrc: Esophageal  PainSc:                  Catheryn Bacon Sharna Gabrys

## 2022-08-27 NOTE — Procedures (Signed)
Extubation Procedure Note  Patient Details:   Name: Brittney Herrera DOB: [birth year] MRN: [id number]   Airway Documentation:    Vent end date: 08/27/22 Vent end time: 1445   Evaluation  O2 sats: stable throughout Complications: No apparent complications Patient did tolerate procedure well. Bilateral Breath Sounds: Clear, Diminished   Yes  Patient extubated per order to 4L  with no apparent complications. Cuff leak was absent- RT and RN notified CCM who ordered Decadron and told RT/RN to proceed with extubation. Patient is alert and oriented, has strong cough, and is able to weakly speak. Vitals are stable and sats are 95%. BBS clear with no stridor noted. RT will continue to monitor.   Braxon Suder Lajuana Ripple 08/27/2022, 2:50 PM

## 2022-08-28 DIAGNOSIS — K668 Other specified disorders of peritoneum: Secondary | ICD-10-CM | POA: Diagnosis not present

## 2022-08-28 LAB — BASIC METABOLIC PANEL
Anion gap: 8 (ref 5–15)
BUN: 17 mg/dL (ref 6–20)
CO2: 28 mmol/L (ref 22–32)
Calcium: 7.7 mg/dL — ABNORMAL LOW (ref 8.9–10.3)
Chloride: 104 mmol/L (ref 98–111)
Creatinine, Ser: 0.73 mg/dL (ref 0.44–1.00)
GFR, Estimated: >60 mL/min (ref >60)
Glucose, Bld: 132 mg/dL — ABNORMAL HIGH (ref 70–99)
Potassium: 3.7 mmol/L (ref 3.5–5.1)
Sodium: 140 mmol/L (ref 135–145)

## 2022-08-28 LAB — CBC
HCT: 29.6 % — ABNORMAL LOW (ref 36.0–46.0)
Hemoglobin: 9.3 g/dL — ABNORMAL LOW (ref 12.0–15.0)
MCH: 32.1 pg (ref 26.0–34.0)
MCHC: 31.4 g/dL (ref 30.0–36.0)
MCV: 102.1 fL — ABNORMAL HIGH (ref 80.0–100.0)
Platelets: 283 10*3/uL (ref 150–400)
RBC: 2.9 MIL/uL — ABNORMAL LOW (ref 3.87–5.11)
RDW: 15.9 % — ABNORMAL HIGH (ref 11.5–15.5)
WBC: 14.9 10*3/uL — ABNORMAL HIGH (ref 4.0–10.5)
nRBC: 0 % (ref 0.0–0.2)

## 2022-08-28 LAB — GLUCOSE, CAPILLARY
Glucose-Capillary: 136 mg/dL — ABNORMAL HIGH (ref 70–99)
Glucose-Capillary: 132 mg/dL — ABNORMAL HIGH (ref 70–99)
Glucose-Capillary: 117 mg/dL — ABNORMAL HIGH (ref 70–99)
Glucose-Capillary: 124 mg/dL — ABNORMAL HIGH (ref 70–99)

## 2022-08-28 LAB — PHOSPHORUS: Phosphorus: 3.6 mg/dL (ref 2.5–4.6)

## 2022-08-28 MED ORDER — POTASSIUM CHLORIDE 10 MEQ/50ML IV SOLN
10.0000 meq | INTRAVENOUS | Status: AC
  Administered 2022-08-28 (×3): 10 meq via INTRAVENOUS
  Filled 2022-08-28 (×3): qty 50

## 2022-08-28 MED ORDER — TRAVASOL 10 % IV SOLN
INTRAVENOUS | Status: AC
  Filled 2022-08-28: qty 993.6

## 2022-08-28 NOTE — Progress Notes (Signed)
NAME:  Brittney Herrera, MRN:  [id number], DOB:  1975/08/18, LOS: 4 ADMISSION DATE:  08/24/2022, CONSULTATION DATE:  08/24/22 REFERRING MD:  CCS CHIEF COMPLAINT:  Shock   History of Present Illness:  Brittney Herrera is 47 year old woman who presented today for abdominal pain found to have free air in the retroperitoneum of the pelvis extending into the mediastinum based on CT imaging. She was taken to the OR for exploratory laparotomy with findings notable for peritonitis, retroperitoneal abscess with a section of small bowel with serosal injury requiring resection. Her appendix was also resected due to dense adhesions to the right adnexa. The small bowel became distended throughout the procedure and she was left in discontinuity. She developed increasing vasopressor needs as well. Her abdomen was left open.  A 19 french drain was placed down in the retroperitoneal abscess space. A abdominal wound vac was put in place. The patient remained intubated and on neosynephrine. She was transferred to the ICU.   PCCM consulted for medical management.   Pertinent  Medical History   Past Medical History:  Diagnosis Date   Arthritis    Bicornuate uterus    Hemorrhoids 11/18/2020   History of abnormal cervical Pap smear    Hypertriglyceridemia 11/18/2020   Hypothyroidism    Rheumatoid aortitis    Thyroid disease    Umbilical hernia 11/28/2020   Significant Hospital Events: Including procedures, antibiotic start and stop dates in addition to other pertinent events   7/2 admitted, OR for ex-lap, area of small bowel resection - left in discontinuity with open abdomen 7/3 in ICU, pressor requirements improving 7/4 returned to OR for washout, 3x3 LUQ abscess drained, end colostomy, small bowel re-anastomosis, abdomen closed 7/5 extubated  Interim History / Subjective:   No acute events overnight Extubated yesterday Pain appears to be appropriately managed Mother at bedside Wound dressing change by  Surgery this AM  Objective   Blood pressure 120/83, pulse 74, temperature 98.5 F (36.9 C), resp. rate (!) 9, height 5\' 3"  (1.6 m), weight 96.1 kg, SpO2 93 %.    Vent Mode: PSV;CPAP FiO2 (%):  [36 %-40 %] 36 % Set Rate:  [30 bmp] 30 bmp Vt Set:  [420 mL] 420 mL PEEP:  [5 cmH20] 5 cmH20 Pressure Support:  [5 cmH20] 5 cmH20 Plateau Pressure:  [19 cmH20] 19 cmH20   Intake/Output Summary (Last 24 hours) at 08/28/2022 7829 Last data filed at 08/28/2022 0700 Gross per 24 hour  Intake 2286.65 ml  Output 1315 ml  Net 971.65 ml   Filed Weights   08/24/22 1120 08/27/22 0500  Weight: 85 kg 96.1 kg    Examination:  General: middle aged female, no acute distress, laying in bed HENT: [place]/AT, ETT, MMM Lungs: diminished breath sounds breath sounds. No wheeze Cardiovascular: RRR, no MRG Abdomen: LUQ ostomy - pink appearing, minimal liquid output, mildly distended, midline incision with dressing in place  Extremities: trace edema in legs Neuro: awake, alert, moving all extremities GU: Foley  Resolved Hospital Problem list   Shock Hyponatremia, mild Hypokalemia Hypomagnesemia  Assessment & Plan:   Sepsis due to acute abdomen - weaned off pressors  - Continue zosyn for intra-abdominal coverage - follow up cultures  Pneumoperitoneum s/p ex lap with portion of small bowel resection 7/2 Retroperineal Pelvic Abscess LUQ abscess drainage, End Colostomy and small bowel re-anastomosis 7/4 - Management per general surgery - Pain control per general surgery  Acute hypoxemic respiratory failure due to septic shock - Extubated 7/5 -  Goal SpO2 92% or higer, wean O2 as able  Acute Kidney Injury - in setting of sepsis - Cr improved - monitor UOP  Moderate Protein Calorie Malnutrition Hypertriglyceridemia - continue TPN - elevated TG in setting of propofol  Anemia - monitor  - transfuse for hemoglobin less than 7g/dL  Hypothyroid: - synthroid to IV  PCCM will sign off.   Best  Practice (right click and "Reselect all SmartList Selections" daily)   Per primary  Labs   CBC: Recent Labs  Lab 08/24/22 1129 08/24/22 2137 08/25/22 0416 08/25/22 1011 08/26/22 0506 08/26/22 1408 08/27/22 0503 08/27/22 0504 08/28/22 0536  WBC 8.2 5.9 14.7*  --  7.9  --   --  10.0 14.9*  NEUTROABS 7.3 5.1 12.4*  --   --   --   --   --   --   HGB 15.3* 13.2 13.4   < > 10.2* 9.9* 9.5* 9.9* 9.3*  HCT 44.9 42.7 42.5   < > 31.1* 29.0* 28.0* 30.3* 29.6*  MCV 94.5 105.2* 100.7*  --  101.6*  --   --  99.3 102.1*  PLT 300 317 397  --  266  --   --  280 283   < > = values in this interval not displayed.    Basic Metabolic Panel: Recent Labs  Lab 08/24/22 2137 08/25/22 0416 08/25/22 1011 08/26/22 0506 08/26/22 1408 08/27/22 0503 08/27/22 0504 08/28/22 0536  NA 130* 130*   < > 133* 136 138 133* 140  K 3.7 3.8   < > 3.3* 3.4* 3.3* 3.4* 3.7  CL 99 99  --  101  --   --  102 104  CO2 20* 19*  --  22  --   --  24 28  GLUCOSE 159* 178*  --  118*  --   --  147* 132*  BUN 17 18  --  22*  --   --  18 17  CREATININE 0.93 1.66*  --  1.58*  --   --  1.11* 0.73  CALCIUM 7.3* 7.3*  --  6.9*  --   --  7.1* 7.7*  MG 1.3* 2.3  --  2.7*  --   --  2.7*  --   PHOS  --  5.3*  --  5.0*  --   --  2.6 3.6   < > = values in this interval not displayed.   GFR: Estimated Creatinine Clearance: 99 mL/min (by C-G formula based on SCr of 0.73 mg/dL). Recent Labs  Lab 08/24/22 2127 08/24/22 2137 08/25/22 0413 08/25/22 0416 08/25/22 0712 08/25/22 1007 08/26/22 0506 08/27/22 0504 08/28/22 0536  WBC  --    < >  --  14.7*  --   --  7.9 10.0 14.9*  LATICACIDVEN 5.0*  --  3.1*  --  3.0* 2.6*  --   --   --    < > = values in this interval not displayed.    Liver Function Tests: Recent Labs  Lab 08/24/22 1129 08/25/22 0416 08/27/22 0504  AST 31 25 25   ALT 31 20 20   ALKPHOS 129* 46 43  BILITOT 2.0* 2.1* 1.7*  PROT 7.4 5.1* 4.6*  ALBUMIN 2.9* 2.0* 1.8*   Recent Labs  Lab 08/24/22 1129  08/25/22 0416  LIPASE 15 15   No results for input(s): "AMMONIA" in the last 168 hours.  ABG    Component Value Date/Time   PHART 7.376 08/27/2022 0503   PCO2ART 39.4 08/27/2022  0503   PO2ART 135 (H) 08/27/2022 0503   HCO3 23.0 08/27/2022 0503   TCO2 24 08/27/2022 0503   ACIDBASEDEF 2.0 08/27/2022 0503   O2SAT 99 08/27/2022 0503     Coagulation Profile: No results for input(s): "INR", "PROTIME" in the last 168 hours.  Cardiac Enzymes: No results for input(s): "CKTOTAL", "CKMB", "CKMBINDEX", "TROPONINI" in the last 168 hours.  HbA1C: No results found for: "HGBA1C"  CBG: Recent Labs  Lab 08/27/22 0554 08/27/22 0746 08/27/22 1228 08/27/22 2323 08/28/22 0515  GLUCAP 151* 135* 113* 144* 136*    Critical care time: n/a     Melody Comas, MD [facility] Office: [phone]   See Amion for personal pager PCCM on call pager [phone] until 7pm. Please call Elink 7p-7a. [phone]

## 2022-08-28 NOTE — Progress Notes (Signed)
Trauma/Critical Care Follow Up Note  Subjective:    Overnight Issues:   Objective:  Vital signs for last 24 hours: Temp:  [97.9 F (36.6 C)-99.5 F (37.5 C)] 98.5 F (36.9 C) (07/06 0817) Pulse Rate:  [73-108] 74 (07/06 0700) Resp:  [9-30] 9 (07/06 0700) BP: (97-141)/(62-99) 120/83 (07/06 0700) SpO2:  [89 %-98 %] 93 % (07/06 0700) Arterial Line BP: (110-176)/(60-100) 152/86 (07/05 1700) FiO2 (%):  [36 %-40 %] 36 % (07/05 1453)  Hemodynamic parameters for last 24 hours:    Intake/Output from previous day: 07/05 0701 - 07/06 0700 In: 2429.5 [I.V.:1727.6; NG/GT:95; IV Piggyback:606.9] Out: 1315 [Urine:595; Emesis/NG output:550; Drains:170]  Intake/Output this shift: No intake/output data recorded.  Vent settings for last 24 hours: Vent Mode: PSV;CPAP FiO2 (%):  [36 %-40 %] 36 % Set Rate:  [30 bmp] 30 bmp Vt Set:  [420 mL] 420 mL PEEP:  [5 cmH20] 5 cmH20 Pressure Support:  [5 cmH20] 5 cmH20 Plateau Pressure:  [19 cmH20] 19 cmH20  Physical Exam:  Gen: comfortable, no distress Neuro: follows commands, alert, communicative HEENT: PERRL Neck: supple CV: RRR Pulm: unlabored breathing on New  Abd: soft, NT, midline wound with granulation tissue  GU: urine clear and yellow, +spontaneous voids Extr: wwp, no edema  Results for orders placed or performed during the hospital encounter of 08/24/22 (from the past 24 hour(s))  Glucose, capillary     Status: Abnormal   Collection Time: 08/27/22 12:28 PM  Result Value Ref Range   Glucose-Capillary 113 (H) 70 - 99 mg/dL  Glucose, capillary     Status: Abnormal   Collection Time: 08/27/22 11:23 PM  Result Value Ref Range   Glucose-Capillary 144 (H) 70 - 99 mg/dL  Glucose, capillary     Status: Abnormal   Collection Time: 08/28/22  5:15 AM  Result Value Ref Range   Glucose-Capillary 136 (H) 70 - 99 mg/dL  CBC     Status: Abnormal   Collection Time: 08/28/22  5:36 AM  Result Value Ref Range   WBC 14.9 (H) 4.0 - 10.5 K/uL    RBC 2.90 (L) 3.87 - 5.11 MIL/uL   Hemoglobin 9.3 (L) 12.0 - 15.0 g/dL   HCT 16.1 (L) 09.6 - 04.5 %   MCV 102.1 (H) 80.0 - 100.0 fL   MCH 32.1 26.0 - 34.0 pg   MCHC 31.4 30.0 - 36.0 g/dL   RDW 40.9 (H) 81.1 - 91.4 %   Platelets 283 150 - 400 K/uL   nRBC 0.0 0.0 - 0.2 %  Basic metabolic panel     Status: Abnormal   Collection Time: 08/28/22  5:36 AM  Result Value Ref Range   Sodium 140 135 - 145 mmol/L   Potassium 3.7 3.5 - 5.1 mmol/L   Chloride 104 98 - 111 mmol/L   CO2 28 22 - 32 mmol/L   Glucose, Bld 132 (H) 70 - 99 mg/dL   BUN 17 6 - 20 mg/dL   Creatinine, Ser 7.82 0.44 - 1.00 mg/dL   Calcium 7.7 (L) 8.9 - 10.3 mg/dL   GFR, Estimated >95 >62 mL/min   Anion gap 8 5 - 15  Phosphorus     Status: None   Collection Time: 08/28/22  5:36 AM  Result Value Ref Range   Phosphorus 3.6 2.5 - 4.6 mg/dL    Assessment & Plan: The plan of care was discussed with the bedside nurse for the day, who is in agreement with this plan and no additional concerns were  raised.   Present on Admission:  Pneumoperitoneum    LOS: 4 days   Additional comments:I reviewed the patient's new clinical lab test results.   and I reviewed the patients new imaging test results.    Pneumoperitoneum, retroperitoneal air, mediastinal air likely secondary to perforated rectosigmoid colon with history of pelvic mass  7/2: ex lap extensive LOA, SBR in discontinuity, placement of pelvic drain, placement of ABThera vacuum dressing, Dr. Corliss Skains   7/4: ex lap, hartmann's procedure,  washout, small bowel anastomosis (re-establish continuity), drainage LUQ IAA, abdominal closure, Dr. Cliffton Asters -  POD#4, POD#2  - continue NG to [facility] and await bowel function - WOC following for new colostomy and new vac to midline 7/8 - NPWT ordered to midline M/W/F -cont abx therapy -cont JP drain -daily labs - TPN anticipate ileus - psych c/s for acute stress reaction   FEN - NPO/NGT/IVFs/pressors/bowel discontinuity, small volume gas in  bag, okay for sips/chips, but leave NG in place; replete K VTE - lovenox ID - zosyn   AKI - resolved Septic shock - secondary to above, appreciate CCM assistance Post op respiratory failure - Weaned to extubation today, appreciate CCM assistance - adjust pain medications Hypokalemia - continue fluids, monitor Hyponatremia - improved Hypothyroidism - IV synthroid daily Anxiety  RA   Diamantina Monks, MD Trauma & General Surgery Please use [email or web address] to contact on call provider  08/28/2022  *Care during the described time interval was provided by me. I have reviewed this patient's available data, including medical history, events of note, physical examination and test results as part of my evaluation.

## 2022-08-28 NOTE — Consult Note (Signed)
Patient seen earlier today in the presence of her mother and husband. States she is not strong enough for comprehensive psychiatric evaluation today and requested to be evaluated tomorrow-08/29/22  Plan-Will see patient on 08/29/22  Thedore Mins, MD

## 2022-08-28 NOTE — Evaluation (Signed)
Physical Therapy Evaluation Patient Details Name: Brittney Herrera MRN: [id number] DOB: 04/24/1975 Today's Date: 08/28/2022  History of Present Illness  47 y/o F presented 08/24/22 with acute onset of pelvic cramping and pain. Found to have free air in her pelvis and mediastinum likely secondary to perforated colon. 7/02 exp lap finding large retroperitoneal abscess and underwent small bowel resection, placed pelvic drain, and VAC dressing. Remained sedated on vent post-op. Return to OR 08/26/22 for small bowel resection, drainage intra-abdominal abscess, colostomy. Extubated 7/05;  PMH anxiety, hypothyroidism, RA (not on steroids or biologics), who has had a known pelvic mass that is follow by Dr. Pricilla Holm with [facility].  She underwent attempted resection in 2022 but given this was so adhered to multiple surrounding structures, it was unable to be removed.  Clinical Impression   Pt admitted secondary to problem above with deficits below. PTA patient was independent with mobility. She lives in a two story house with no steps to enter and bed/bath on first floor. Her mother is here from out-of-town and plans to stay as long as pt needs her.  Pt currently requires min-mod assist for basic mobility. Patient somewhat lethargic with frequent eye closing due to pain meds prior to session. Patient is very anxious re: pain and mobility, therefore continue to recommend pre-medication until she is less anxious.  Anticipate patient will benefit from PT to address problems listed below.Will continue to follow acutely to maximize functional mobility independence and safety.           Assistance Recommended at Discharge Set up Supervision/Assistance  If plan is discharge home, recommend the following:  Can travel by private vehicle  Two people to help with walking and/or transfers;A lot of help with bathing/dressing/bathroom;Assistance with cooking/housework;Direct supervision/assist for medications management;Direct  supervision/assist for financial management;Assist for transportation;Help with stairs or ramp for entrance        Equipment Recommendations Rolling walker (2 wheels)  Recommendations for Other Services       Functional Status Assessment Patient has had a recent decline in their functional status and demonstrates the ability to make significant improvements in function in a reasonable and predictable amount of time.     Precautions / Restrictions Precautions Precautions: Fall;Other (comment) Precaution Comments: new colostomy, drain Left abd, NG tube, foley, O2, IVs      Mobility  Bed Mobility Overal bed mobility: Needs Assistance Bed Mobility: Rolling, Sidelying to Sit, Sit to Sidelying Rolling: Min assist Sidelying to sit: Mod assist, HOB elevated     Sit to sidelying: Min assist General bed mobility comments: cues for technique to protect abd; incr time due to pt fearful of anticipated pain    Transfers Overall transfer level: Needs assistance Equipment used: 2 person hand held assist Transfers: Sit to/from Stand Sit to Stand: Min assist           General transfer comment: mother assisted on other side    Ambulation/Gait             Pre-gait activities: side-step along EOB x 3 steps each foot    Stairs            Wheelchair Mobility     Tilt Bed    Modified Rankin (Stroke Patients Only)       Balance Overall balance assessment: Mild deficits observed, not formally tested (closing eyes in standing due to pain meds)  Pertinent Vitals/Pain Pain Assessment Pain Assessment: Faces Faces Pain Scale: Hurts whole lot Pain Location: abd Pain Descriptors / Indicators: Discomfort, Guarding Pain Intervention(s): Limited activity within patient's tolerance, Monitored during session, Premedicated before session, Repositioned    Home Living Family/patient expects to be discharged to::  Private residence Living Arrangements: Children;Parent (mother in town and plans to stay as long as needed) Available Help at Discharge: Family;Available 24 hours/day Type of Home: House Home Access: Level entry       Home Layout: Two level;Able to live on main level with bedroom/bathroom        Prior Function Prior Level of Function : Independent/Modified Independent                     Hand Dominance   Dominant Hand: Right    Extremity/Trunk Assessment   Upper Extremity Assessment Upper Extremity Assessment: Defer to OT evaluation    Lower Extremity Assessment Lower Extremity Assessment: Overall WFL for tasks assessed    Cervical / Trunk Assessment Cervical / Trunk Assessment: Other exceptions Cervical / Trunk Exceptions: abdominal surgery with colostomy, VAC, and JP drain  Communication   Communication: No difficulties  Cognition Arousal/Alertness: Lethargic, Suspect due to medications (freq eye closing and required repetition of cues) Behavior During Therapy: Anxious Overall Cognitive Status: Difficult to assess                                          General Comments General comments (skin integrity, edema, etc.): Mother present and very encouraging re: need to mobilize. Patient immediately tearful in anticipation of pain and frustrated that she just got pain meds and was comfortable.    Exercises     Assessment/Plan    PT Assessment Patient needs continued PT services  PT Problem List Decreased activity tolerance;Decreased balance;Decreased mobility;Decreased knowledge of use of DME;Decreased knowledge of precautions;Cardiopulmonary status limiting activity;Pain;Obesity;Decreased skin integrity       PT Treatment Interventions DME instruction;Gait training;Functional mobility training;Therapeutic activities;Therapeutic exercise;Balance training;Patient/family education    PT Goals (Current goals can be found in the Care Plan  section)  Acute Rehab PT Goals Patient Stated Goal: return home PT Goal Formulation: With patient Time For Goal Achievement: 09/11/22 Potential to Achieve Goals: Good    Frequency Min 3X/week     Co-evaluation               AM-PAC PT "6 Clicks" Mobility  Outcome Measure Help needed turning from your back to your side while in a flat bed without using bedrails?: A Little Help needed moving from lying on your back to sitting on the side of a flat bed without using bedrails?: A Lot Help needed moving to and from a bed to a chair (including a wheelchair)?: Total Help needed standing up from a chair using your arms (e.g., wheelchair or bedside chair)?: Total Help needed to walk in hospital room?: Total Help needed climbing 3-5 steps with a railing? : Total 6 Click Score: 9    End of Session Equipment Utilized During Treatment: Oxygen Activity Tolerance: Patient limited by lethargy Patient left: in bed;with call bell/phone within reach;with family/visitor present Nurse Communication: Mobility status PT Visit Diagnosis: Other abnormalities of gait and mobility (R26.89);Pain Pain - part of body:  (abd)    Time: 3086-5784 PT Time Calculation (min) (ACUTE ONLY): 38 min   Charges:   PT Evaluation $PT  Eval Low Complexity: 1 Low PT Treatments $Therapeutic Activity: 23-37 mins PT General Charges $$ ACUTE PT VISIT: 1 Visit          Jerolyn Center, PT Acute Rehabilitation Services  Office [phone]   Zena Amos 08/28/2022, 2:58 PM

## 2022-08-28 NOTE — Progress Notes (Signed)
eLink Physician-Brief Progress Note Patient Name: Brittney Herrera DOB: April 15, 1975 MRN: [id number]   Date of Service  08/28/2022  HPI/Events of Note  Patient in ICU with respiratory failure on vent.  Extubated yesterday.  Had daily ABGs ordered while on vent.  RT wants to know whether or not to draw another ABG this morning.  eICU Interventions  Discontinue daily ABG order.     Intervention Category Minor Interventions: Other:  Carilyn Goodpasture 08/28/2022, 3:40 AM

## 2022-08-28 NOTE — Progress Notes (Signed)
PHARMACY - TOTAL PARENTERAL NUTRITION CONSULT NOTE   Indication:  bowel discontinuity  Patient Measurements: Height: 5\' 3"  (160 cm) Weight: 96.1 kg (211 lb 13.8 oz) IBW/kg (Calculated) : 52.4 TPN AdjBW (KG): 60.6 Body mass index is 37.53 kg/m. Usual Weight: 85 kg  Assessment:   47 y.o. F with history of a known pelvic mass that is follow by Dr. Pricilla Holm with [facility].  She underwent attempted resection in 2022 but given this was so adhered to multiple surrounding structures, it was unable to be removed. Biopsies were obtained at this time and this was not found to be malignant. Her last MRI revealed this was smaller in size. She reports being in her normal state of health until Saturday when she developed an acute onset of pelvic cramping and pain. Admitted for septic shock, started on Zosyn. Leukocytosis now resolved, improved hemodynamics. Renal function improving. Anticipated prolonged NPO status per Surgery. Pharmacy consulted to initiate TPN.   Glucose / Insulin: no hx DM, CBGs<180, 2u SSI/24h  Electrolytes: K 3.7, Na 140, phos 3.6, Mg 2.7, CoCa 8.9 Renal: Scr 0.73 (bsl <1), BUN 17 Hepatic: LFTs wnl, tbili 1.7, Alb 1.8  Hx hypertriglyceridemia TG up 377 > 442 > 493  Intake / Output; MIVF: UOP 0.3 mL/kg/hr, emesis/NG output 550 mL, drains 170 mL  GI Imaging: 7/02 CT abd: Pneumomediastinum extending into the lower neck. There is pneumoperitoneum in the upper abdomen suggesting possible bowel perforation. Fatty liver. GI Surgeries / Procedures:  7/02 OR for ex-lap, area of small bowel resection - left in discontinuity with open abdomen 7/04 return to OR for washout +/- re-anastomosis   Central access: 7/03 TPN start date: 7/04 PM  Nutritional Goals: Goal TPN rate is 90 mL/hr (provides 99 g of protein and 1280 kcals per day + 660 kcals from propofol per day)  RD Assessment: Estimated Needs Total Energy Estimated Needs: 1900-2100 Total Protein Estimated Needs: 105-125g Total Fluid  Estimated Needs: >/=1.9L  Current Nutrition:  NPO  Plan:  Pt was extubated - off propofol now Advance TPN to 90 mL/hr at 1800 (to meet 70% total nutritional needs) -continue no lipids in TPN d/t hypertriglyceridemia Electrolytes in TPN: Na 53 mEq/L, K 35 mEq/L, Ca 76mEq/L, Mg 0 mEq/L, Phos 7 mmol/L. Cl:Ac 1:1 Add standard MVI and trace elements to TPN Initiate Sensitive q6h SSI and adjust as needed  Monitor TPN labs on Mon/Thurs, daily as needed Daily TG due to history of hypertriglyceridemia   Thank you for allowing pharmacy to be a part of this patient's care.  Calton Dach, PharmD Clinical Pharmacist 08/28/2022 7:32 AM

## 2022-08-29 LAB — TRIGLYCERIDES: Triglycerides: 355 mg/dL — ABNORMAL HIGH (ref <150)

## 2022-08-29 LAB — GLUCOSE, CAPILLARY
Glucose-Capillary: 134 mg/dL — ABNORMAL HIGH (ref 70–99)
Glucose-Capillary: 144 mg/dL — ABNORMAL HIGH (ref 70–99)
Glucose-Capillary: 140 mg/dL — ABNORMAL HIGH (ref 70–99)
Glucose-Capillary: 129 mg/dL — ABNORMAL HIGH (ref 70–99)
Glucose-Capillary: 144 mg/dL — ABNORMAL HIGH (ref 70–99)

## 2022-08-29 LAB — CBC
HCT: 28.5 % — ABNORMAL LOW (ref 36.0–46.0)
Hemoglobin: 9 g/dL — ABNORMAL LOW (ref 12.0–15.0)
MCH: 31.6 pg (ref 26.0–34.0)
MCHC: 31.6 g/dL (ref 30.0–36.0)
MCV: 100 fL (ref 80.0–100.0)
Platelets: 356 10*3/uL (ref 150–400)
RBC: 2.85 MIL/uL — ABNORMAL LOW (ref 3.87–5.11)
RDW: 16.3 % — ABNORMAL HIGH (ref 11.5–15.5)
WBC: 16.5 10*3/uL — ABNORMAL HIGH (ref 4.0–10.5)
nRBC: 0.4 % — ABNORMAL HIGH (ref 0.0–0.2)

## 2022-08-29 LAB — PHOSPHORUS: Phosphorus: 2 mg/dL — ABNORMAL LOW (ref 2.5–4.6)

## 2022-08-29 LAB — BASIC METABOLIC PANEL
Anion gap: 9 (ref 5–15)
BUN: 15 mg/dL (ref 6–20)
CO2: 28 mmol/L (ref 22–32)
Calcium: 7.8 mg/dL — ABNORMAL LOW (ref 8.9–10.3)
Chloride: 102 mmol/L (ref 98–111)
Creatinine, Ser: 0.81 mg/dL (ref 0.44–1.00)
GFR, Estimated: >60 mL/min (ref >60)
Glucose, Bld: 138 mg/dL — ABNORMAL HIGH (ref 70–99)
Potassium: 3.4 mmol/L — ABNORMAL LOW (ref 3.5–5.1)
Sodium: 139 mmol/L (ref 135–145)

## 2022-08-29 LAB — CULTURE, BLOOD (ROUTINE X 2)
Culture: NO GROWTH
Special Requests: ADEQUATE

## 2022-08-29 MED ORDER — POTASSIUM PHOSPHATES 15 MMOLE/5ML IV SOLN
30.0000 mmol | Freq: Once | INTRAVENOUS | Status: AC
  Administered 2022-08-29: 30 mmol via INTRAVENOUS
  Filled 2022-08-29: qty 10

## 2022-08-29 MED ORDER — METHOCARBAMOL 1000 MG/10ML IJ SOLN
1000.0000 mg | Freq: Three times a day (TID) | INTRAVENOUS | Status: DC
  Administered 2022-08-29 – 2022-08-30 (×4): 1000 mg via INTRAVENOUS
  Filled 2022-08-29: qty 1000
  Filled 2022-08-29 (×3): qty 10
  Filled 2022-08-29: qty 1000
  Filled 2022-08-29 (×2): qty 10

## 2022-08-29 MED ORDER — TRAVASOL 10 % IV SOLN
INTRAVENOUS | Status: AC
  Filled 2022-08-29: qty 993.6

## 2022-08-29 NOTE — Plan of Care (Signed)

## 2022-08-29 NOTE — Progress Notes (Signed)
PHARMACY - TOTAL PARENTERAL NUTRITION CONSULT NOTE   Indication:  bowel discontinuity  Patient Measurements: Height: 5\' 3"  (160 cm) Weight: 96.1 kg (211 lb 13.8 oz) IBW/kg (Calculated) : 52.4 TPN AdjBW (KG): 60.6 Body mass index is 37.53 kg/m. Usual Weight: 85 kg  Assessment:   47 y.o. F with history of a known pelvic mass that is follow by Dr. Pricilla Holm with [facility].  She underwent attempted resection in 2022 but given this was so adhered to multiple surrounding structures, it was unable to be removed. Biopsies were obtained at this time and this was not found to be malignant. Her last MRI revealed this was smaller in size. She reports being in her normal state of health until Saturday when she developed an acute onset of pelvic cramping and pain. Admitted for septic shock, started on Zosyn. Leukocytosis now resolved, improved hemodynamics. Renal function improving. Anticipated prolonged NPO status per Surgery. Pharmacy consulted to initiate TPN.   Glucose / Insulin: no hx DM, CBGs<180, 2u SSI/24h  Electrolytes: K 3.4, Na 139, phos 2.0, Mg 2.7, CoCa 8.9 Renal: Scr 0.81 (bsl <1), BUN 15 Hepatic: LFTs wnl, tbili 1.7, Alb 1.8  Hx hypertriglyceridemia TG up 377 > 442 > 493>355 Intake / Output; MIVF: UOP 0.2 mL/kg/hr, drains 60 mL  GI Imaging: 7/02 CT abd: Pneumomediastinum extending into the lower neck. There is pneumoperitoneum in the upper abdomen suggesting possible bowel perforation. Fatty liver. GI Surgeries / Procedures:  7/02 OR for ex-lap, area of small bowel resection - left in discontinuity with open abdomen 7/04 return to OR for washout +/- re-anastomosis   Central access: 7/03 TPN start date: 7/04 PM  Nutritional Goals: Goal TPN rate is 90 mL/hr (provides 99 g of protein and 1280 kcals per day + 660 kcals from propofol per day)  RD Assessment: Estimated Needs Total Energy Estimated Needs: 1900-2100 Total Protein Estimated Needs: 105-125g Total Fluid Estimated Needs:  >/=1.9L  Current Nutrition:  NPO - ok for sips/chips 7/6  Plan:  Continue TPN at 90 mL/hr at 1800 (to meet 70% total nutritional needs) -continue no lipids in TPN d/t hypertriglyceridemia Electrolytes in TPN: Na 53 mEq/L, K 44 mEq/L, Ca 4mEq/L, Mg 2 mEq/L, Phos 10 mmol/L. Cl:Ac 1:1 -Kphos x1 this AM Continue standard MVI and trace elements to TPN Initiate Sensitive q6h SSI and adjust as needed  Monitor TPN labs on Mon/Thurs, daily as needed F/u ileostomy output and transition back to PO nutrition as able  Thank you for allowing pharmacy to be a part of this patient's care.  Calton Dach, PharmD Clinical Pharmacist 08/29/2022 7:21 AM

## 2022-08-29 NOTE — Progress Notes (Signed)
OT Cancellation Note  Patient Details Name: Brittney Herrera MRN: [id number] DOB: 1975/06/24   Cancelled Treatment:    Reason Eval/Treat Not Completed: Pain limiting ability to participate (Pt declining participation in skilled OT evaluation at this time secondary to pain. Pt also requesting pain medication. RN notified. OT to reattempt skilled OT eval as appropriate/available.)  [name] "[name], OTR/L, MA Acute Rehab [phone]   [name] 08/29/2022, 3:02 PM

## 2022-08-29 NOTE — Consult Note (Signed)
Patient seen face to face in her hospital bedroom with her husband by her bedside. Patient reports being tired from taking post-op pain medications and requested for the psych evaluation to be deferred to a later date when she is strong enough to express her feeling more clearly. Plan: Psych consult service with attempt to see patient on Monday-08/30/22  Thedore Mins, MD

## 2022-08-29 NOTE — Progress Notes (Signed)
Progress Note  3 Days Post-Op  Subjective: Pt with pain control issues overnight and having a hard time getting comfortable in the bed. Small volume gas in ostomy bag. Husband at bedside.   Objective: Vital signs in last 24 hours: Temp:  [97.5 F (36.4 C)-98.7 F (37.1 C)] 97.6 F (36.4 C) (07/07 0835) Pulse Rate:  [68-95] 83 (07/07 0835) Resp:  [11-24] 14 (07/07 0835) BP: (122-165)/(85-99) 144/93 (07/07 0835) SpO2:  [89 %-96 %] 93 % (07/07 0835) Last BM Date :  (pta)  Intake/Output from previous day: 07/06 0701 - 07/07 0700 In: 726 [I.V.:499.7; IV Piggyback:226.3] Out: 510 [Urine:450; Drains:60] Intake/Output this shift: No intake/output data recorded.  PE: General: WD, obese female who is laying in bed in NAD Heart: regular, rate, and rhythm.   Lungs: CTAB, no wheezes, rhonchi, or rales noted.  Respiratory effort nonlabored Abd: soft, generalized ttp, midline wound clean, drain with SS fluid, stoma viable with small volume gas in bag, NGT with bilious drainage Psych: A&Ox3 with an anxious affect.    Lab Results:  Recent Labs    08/28/22 0536 08/29/22 0432  WBC 14.9* 16.5*  HGB 9.3* 9.0*  HCT 29.6* 28.5*  PLT 283 356   BMET Recent Labs    08/28/22 0536 08/29/22 0432  NA 140 139  K 3.7 3.4*  CL 104 102  CO2 28 28  GLUCOSE 132* 138*  BUN 17 15  CREATININE 0.73 0.81  CALCIUM 7.7* 7.8*   PT/INR No results for input(s): "LABPROT", "INR" in the last 72 hours. CMP     Component Value Date/Time   NA 139 08/29/2022 0432   K 3.4 (L) 08/29/2022 0432   CL 102 08/29/2022 0432   CO2 28 08/29/2022 0432   GLUCOSE 138 (H) 08/29/2022 0432   BUN 15 08/29/2022 0432   CREATININE 0.81 08/29/2022 0432   CALCIUM 7.8 (L) 08/29/2022 0432   PROT 4.6 (L) 08/27/2022 0504   ALBUMIN 1.8 (L) 08/27/2022 0504   AST 25 08/27/2022 0504   ALT 20 08/27/2022 0504   ALKPHOS 43 08/27/2022 0504   BILITOT 1.7 (H) 08/27/2022 0504   GFRNONAA >60 08/29/2022 0432   Lipase      Component Value Date/Time   LIPASE 15 08/25/2022 0416       Studies/Results: No results found.  Anti-infectives: Anti-infectives (From admission, onward)    Start     Dose/Rate Route Frequency Ordered Stop   08/24/22 2200  piperacillin-tazobactam (ZOSYN) IVPB 3.375 g        3.375 g 12.5 mL/hr over 240 Minutes Intravenous Every 8 hours 08/24/22 1635 08/31/22 2159   08/24/22 1430  piperacillin-tazobactam (ZOSYN) IVPB 3.375 g        3.375 g 100 mL/hr over 30 Minutes Intravenous  Once 08/24/22 1423 08/24/22 1608        Assessment/Plan  Pneumoperitoneum, retroperitoneal air, mediastinal air likely secondary to perforated rectosigmoid colon with history of pelvic mass  7/2: ex lap extensive LOA, SBR in discontinuity, placement of pelvic drain, placement of ABThera vacuum dressing, Dr. Corliss Skains   7/4: ex lap, hartmann's procedure,  washout, small bowel anastomosis (re-establish continuity), drainage LUQ IAA, abdominal closure, Dr. Cliffton Asters -  POD#5, POD#3 - continue NG to LIWS and await bowel function - possibly clamp tomorrow if more bowel function  - WOC following for new colostomy and new vac to midline 7/8 - NPWT ordered to midline M/W/F -cont abx therapy - cont JP drain - daily labs - TPN anticipate ileus -  psych c/s for acute stress reaction   FEN - NPO/NGT/IVFs/pressors/bowel discontinuity, small volume gas in bag, okay for sips/chips, but leave NG in place; replete K VTE - lovenox ID - zosyn   AKI - resolved Septic shock - secondary to above, appreciate CCM assistance Post op respiratory failure - Weaned to extubation today, appreciate CCM assistance - adjust pain medications Hypokalemia - continue fluids, monitor Hyponatremia - improved Hypothyroidism - IV synthroid daily Anxiety  RA    LOS: 5 days    Juliet Rude, [facility] Surgery 08/29/2022, 12:10 PM Please see Amion for pager number during day hours 7:00am-4:30pm

## 2022-08-29 NOTE — Progress Notes (Signed)
PA requested for the pain med to be given early d/t changing patient's dressing now.

## 2022-08-30 LAB — GLUCOSE, CAPILLARY
Glucose-Capillary: 122 mg/dL — ABNORMAL HIGH (ref 70–99)
Glucose-Capillary: 139 mg/dL — ABNORMAL HIGH (ref 70–99)

## 2022-08-30 LAB — COMPREHENSIVE METABOLIC PANEL
ALT: 30 U/L (ref 0–44)
AST: 25 U/L (ref 15–41)
Albumin: 1.5 g/dL — ABNORMAL LOW (ref 3.5–5.0)
Alkaline Phosphatase: 90 U/L (ref 38–126)
Anion gap: 12 (ref 5–15)
BUN: 11 mg/dL (ref 6–20)
CO2: 27 mmol/L (ref 22–32)
Calcium: 7.8 mg/dL — ABNORMAL LOW (ref 8.9–10.3)
Chloride: 98 mmol/L (ref 98–111)
Creatinine, Ser: 0.68 mg/dL (ref 0.44–1.00)
GFR, Estimated: >60 mL/min (ref >60)
Glucose, Bld: 129 mg/dL — ABNORMAL HIGH (ref 70–99)
Potassium: 3.4 mmol/L — ABNORMAL LOW (ref 3.5–5.1)
Sodium: 137 mmol/L (ref 135–145)
Total Bilirubin: 2.1 mg/dL — ABNORMAL HIGH (ref 0.3–1.2)
Total Protein: 5.3 g/dL — ABNORMAL LOW (ref 6.5–8.1)

## 2022-08-30 LAB — TYPE AND SCREEN: Antibody Screen: NEGATIVE

## 2022-08-30 LAB — CBC
HCT: 27 % — ABNORMAL LOW (ref 36.0–46.0)
Hemoglobin: 8.9 g/dL — ABNORMAL LOW (ref 12.0–15.0)
MCH: 32.2 pg (ref 26.0–34.0)
MCHC: 33 g/dL (ref 30.0–36.0)
MCV: 97.8 fL (ref 80.0–100.0)
Platelets: 424 10*3/uL — ABNORMAL HIGH (ref 150–400)
RBC: 2.76 MIL/uL — ABNORMAL LOW (ref 3.87–5.11)
RDW: 16.4 % — ABNORMAL HIGH (ref 11.5–15.5)
WBC: 15.5 10*3/uL — ABNORMAL HIGH (ref 4.0–10.5)
nRBC: 0.4 % — ABNORMAL HIGH (ref 0.0–0.2)

## 2022-08-30 LAB — BPAM RBC
Blood Product Expiration Date: 202407242359
Blood Product Expiration Date: 202407242359
Unit Type and Rh: 6200

## 2022-08-30 LAB — PHOSPHORUS: Phosphorus: 3.6 mg/dL (ref 2.5–4.6)

## 2022-08-30 LAB — MAGNESIUM: Magnesium: 1.6 mg/dL — ABNORMAL LOW (ref 1.7–2.4)

## 2022-08-30 LAB — SURGICAL PATHOLOGY

## 2022-08-30 LAB — TRIGLYCERIDES: Triglycerides: 312 mg/dL — ABNORMAL HIGH (ref <150)

## 2022-08-30 MED ORDER — TRAVASOL 10 % IV SOLN
INTRAVENOUS | Status: AC
  Filled 2022-08-30: qty 1252.8

## 2022-08-30 MED ORDER — MAGNESIUM SULFATE 2 GM/50ML IV SOLN
2.0000 g | Freq: Once | INTRAVENOUS | Status: AC
  Administered 2022-08-30: 2 g via INTRAVENOUS
  Filled 2022-08-30: qty 50

## 2022-08-30 MED ORDER — THIAMINE HCL 100 MG/ML IJ SOLN
100.0000 mg | Freq: Every day | INTRAMUSCULAR | Status: DC
  Administered 2022-08-30 – 2022-09-01 (×3): 100 mg via INTRAVENOUS
  Filled 2022-08-30 (×3): qty 2

## 2022-08-30 MED ORDER — OXYCODONE HCL 5 MG PO TABS: 5.0000 mg | ORAL_TABLET | ORAL | Status: DC | PRN

## 2022-08-30 MED ORDER — METHOCARBAMOL 1000 MG/10ML IJ SOLN
1000.0000 mg | Freq: Four times a day (QID) | INTRAVENOUS | Status: DC
  Administered 2022-08-30 – 2022-09-06 (×26): 1000 mg via INTRAVENOUS
  Filled 2022-08-30 (×2): qty 1000
  Filled 2022-08-30 (×2): qty 10
  Filled 2022-08-30: qty 1000
  Filled 2022-08-30 (×2): qty 10
  Filled 2022-08-30 (×3): qty 1000
  Filled 2022-08-30 (×3): qty 10
  Filled 2022-08-30 (×7): qty 1000
  Filled 2022-08-30: qty 10
  Filled 2022-08-30: qty 1000
  Filled 2022-08-30: qty 10
  Filled 2022-08-30 (×2): qty 1000
  Filled 2022-08-30 (×2): qty 10
  Filled 2022-08-30 (×4): qty 1000
  Filled 2022-08-30 (×3): qty 10

## 2022-08-30 MED ORDER — POTASSIUM CHLORIDE 10 MEQ/100ML IV SOLN
10.0000 meq | INTRAVENOUS | Status: AC
  Administered 2022-08-30 (×6): 10 meq via INTRAVENOUS
  Filled 2022-08-30 (×6): qty 100

## 2022-08-30 MED ORDER — INSULIN ASPART 100 UNIT/ML IJ SOLN
0.0000 [IU] | Freq: Four times a day (QID) | INTRAMUSCULAR | Status: DC
  Administered 2022-08-31 (×2): 2 [IU] via SUBCUTANEOUS

## 2022-08-30 NOTE — Consult Note (Signed)
WOC Nurse ostomy consult note Stoma type/location: LUQ colostomy Stomal assessment/size: 1 1/2" pale pink, os points down towards 6 o'clock,slightly flush at the bottom, budded on top half.  Peristomal assessment: intact midline incision in close proximity, applying NPWT today Treatment options for stomal/peristomal skin:  barrier ring  Output none at this time Ostomy pouching: 2pc. 2 3/4" pouch with barrier ring  Education provided: Spouse at bedside, pouch change performed.  Discussed emptying when 1/3 full once bowel function returns.  Barrier ring.  Cutting barrier and application of pouch. Demonstrated open/close .  We discuss showering, options such as filtered pouch.  She is tearful but participative.   Enrolled patient in [employer] DC program: Yes will today.  WOC Nurse Consult Note: Reason for Consult: midline surgical incision, asked to apply NPWT (VAC) therapy.  Patient is premedicated prior to procedure Wound type:midline surgical Pressure Injury POA: NA Measurement: 29 cm x 10 cm x 4.5 cm  Wound bed: pale pink, adipose, some cauterized (black) areas. Drainage (amount, consistency, odor) minimal serosanguinous Periwound: LUQ colostomy, pouch and NPWT dressing do not overlap Dressing procedure/placement/frequency: CLeanse midline wound with NS and pat dry. Applied 2 pieces black foam to fill deep, irregularly shaped wound.  Drape applied and seal achieved at 125 mmHg. Change MOn/Wed/Fri.  Spouse will be present Wednesday AM for ongoing ostomy teaching.  Written materials provided.  Will follow.  Mike Gip MSN, RN, FNP-BC CWON Wound, Ostomy, Continence Nurse Outpatient [facility] [phone] Pager [phone]

## 2022-08-30 NOTE — Progress Notes (Signed)
PHARMACY - TOTAL PARENTERAL NUTRITION CONSULT NOTE   Indication: Prolonged NPO status   Patient Measurements: Height: 5\' 3"  (160 cm) Weight: 96.1 kg (211 lb 13.8 oz) IBW/kg (Calculated) : 52.4 TPN AdjBW (KG): 60.6 Body mass index is 37.53 kg/m. Usual Weight: 85 kg  Assessment:   47 y.o. F with history of a known pelvic mass that is follow by Dr. Pricilla Holm with [facility].  She underwent attempted resection in 2022 but given this was so adhered to multiple surrounding structures, it was unable to be removed. Biopsies were obtained at this time and this was not found to be malignant. Her last MRI revealed this was smaller in size. She reports being in her normal state of health until Saturday when she developed an acute onset of pelvic cramping and pain. Admitted for septic shock, started on Zosyn. Leukocytosis now resolved, improved hemodynamics. Renal function improving. Anticipated prolonged NPO status per Surgery. Pharmacy consulted to initiate TPN.   Glucose / Insulin: no hx DM, CBGs <180, used 4u SSI/24h  Electrolytes: K 3.4 & phos 2.0  (Received Kphos 30 mmol, has 45 mEq of K), Mg 1.6, CoCa 8.9, others wnl Renal: Scr 0.68 (bsl <1), BUN wnl Hepatic: LFTs wnl, tbili 2.1, Alb 1.5, Hx hypertriglyceridemia TG up 377 > 442 > 493>355 >312 Intake / Output; MIVF: UOP 1.1 mL/kg/hr, NGT , drains 10 mL. Net + 6.2 L  GI Imaging: 7/2 CT abd: Pneumomediastinum extending into the lower neck. There is pneumoperitoneum in the upper abdomen suggesting possible bowel perforation. Fatty liver. GI Surgeries / Procedures:  7/2 OR for ex-lap, area of small bowel resection - left in discontinuity with open abdomen 7/4  OR for washout, hartmann's procedure, SB re-anastomosis, abdominal closure   Central access: 7/3 PICC  TPN start date: 7/4    Nutritional Goals: Goal TPN rate without lipids is 90 mL/hr (provides 125g protein and 1897 kcals)  RD Assessment: Estimated Needs Total Energy Estimated Needs:  1900-2100 Total Protein Estimated Needs: 105-125g Total Fluid Estimated Needs: >/=1.9L  Current Nutrition:  NPO - ok for sips/chips 7/6 + TPN  Plan:  Adjust TPN to goal 90 mL/hr at 1800 without lipids, provides 125g protein and 1897 kcals, meeting 100% estimated needs Electrolytes in TPN: increase Na 75 mEq/L, increase K 55 mEq/L, Ca 5 mEq/L, increase Mg 5 mEq/L, Phos 10 mmol/L. Cl:Ac 1:1 Give Kcl 60 mEq IV  Give 2g mg IV  Continue standard MVI and trace elements to TPN Continue Sensitive q6h SSI and adjust as needed - note increased dextrose in TPN  Monitor TPN labs on Mon/Thurs, daily as needed  F/u weight - RN to attempt  Thank you for allowing pharmacy to be a part of this patient's care.  Alphia Moh, PharmD, BCPS, BCCP Clinical Pharmacist  Please check AMION for all [facility] phone numbers After 10:00 PM, call Main Pharmacy [phone]

## 2022-08-30 NOTE — Progress Notes (Signed)
Nutrition Follow-up  DOCUMENTATION CODES:   Obesity unspecified  INTERVENTION:  Continue TPN per pharmacy. Currently receiving TPN without lipids. Consider provision of SMOF lipid in TPN if pt expected to remain on TPN >1 week. Rapid infusion of lipids can lead to development of hypertriglyceridemia, but pt with history of hypertriglyceridemia at baseline. Excess provision of dextrose to meet kcal needs in absence of lipid provision can also contribute to hypertriglyceridemia.  Monitor magnesium, potassium, and phosphorus daily for at least 3 days, MD to replete as needed, as pt is at risk for refeeding syndrome.   Provide thiamine 100 mg IV daily x 5 days due to risk for refeeding syndrome.  Recommend measuring daily weights on TPN.  NUTRITION DIAGNOSIS:   Inadequate oral intake related to acute illness as evidenced by NPO status.  Ongoing - addressing with TPN.  GOAL:    (Meet 100% of nutrition needs via Parenteral Nutrition Support)  Met with TPN.  MONITOR:   Vent status, Labs, Weight trends, I & O's  REASON FOR ASSESSMENT:   Ventilator    ASSESSMENT:   Pt admitted with pneumoperitoneum. PMH significant for pelvic mass, s/p attempted resection (2022), and open incisional hernia repair (2023).  7/2: s/p ex lap with extensive LOA, SBR in discontinuity, placement of pelvic rain, placement of ABThera vacuum dressing 7/4: s/p ex lap, hartmann's procedure, washout, small bowel anastomosis (re-establish continuity), drainage LUQ, abdominal closure  7/5: extubated 7/7: diet order updated to allow 4 oz apple juice BID to sip per surgery 7/8: wound VAC placed to midline surgical incision  Met with pt and her husband at bedside. Pt reports plan to continue NG tube to LIS due to output. There was approximately 600 mL in cannister at time of RD assessment. Pt denies nausea, emesis, or abdominal pain. Reports some gas in colostomy but no true output documented yet. Pt denies food  allergies. She reports she does limit soy intake in her diet due to concern for impact on her thyroid. She reports typically following pescatarian/vegetarian diet and that her intake was variable PTA. Denies any difficulty with chewing/swallowing at baseline. Concern for risk for refeeding syndrome. Pt will be advancing to goal TPN regimen tonight. Pt would benefit from provision of IV thiamine.  Pt declines having any unintentional weight loss PTA. However, she reports she would not like to share her UBW at time of RD assessment. Per review of chart pt was 80.2-86.5 kg in 2023. Admission wt was documented to be 85 kg and then subsequent weight on 7/5 96.1 kg. Pt with mild-moderate pitting edema documented. Recommend measuring daily weights on TPN.  14 Fr. NG tube remains in place to LIS; terminates in stomach per abdominal x-ray 7/2  IV Access: triple lumen PICC placed 7/5  TPN order to start evening of 7/8 (advancing to goal today): Access: central Dosing weight: 96.1 kg Amino acids: 125.28 grams Dextrose: 410.4 grams GIR: 2.97 mg/kg/min Lipids: none - being held Volume: 2160 mL Rate: 90 mL/hour Additives: adult MVI 10 mL, trace elements 1 mL, chromium chloride 10 micrograms Provides: 1896.48 kcal, 125 grams protein Macronutrient distribution: 74% of kcal from dextrose, 26% of kcal from amino acids  Medications reviewed and include: Colace 100 mg BID per tube, gabapentin, Novolog 0-9 units Q6 hours, levothyroxine, Zosyn, potassium chloride 10 mEq IV every 1 hour x 6 today  Labs reviewed: CBG 122-144, Potassium 3.4, Magnesium 1.6, Phosphorus WNL today. Phosphorus yesterday 2.0. Triglycerides: 337 7/3, 442 7/4, 493 7/5, 355 7/7, 312  7/8 Pt with history of hypertriglyceridemia at baseline  UOP: 2550 mL (1.1 mL/kg/hr) in previous 24 hours NG tube output: 1550 mL in previous 24 hours Colostomy output: none documented in previous 24 hours Right abdomen JP drain: 10 mL output in previous 24  hours  I/O: +6161.4 mL since admission  Discussed with pharmacy via secure chat. Lipids being held in setting of hypertriglyceridemia.  Discussed with surgery PA via secure chat. Okay to start thiamine 100 mg daily IV x 5 days.  Diet Order:   Diet Order             Diet NPO time specified Except for: Ice Chips, Other (See Comments)  Diet effective now                  EDUCATION NEEDS:   No education needs have been identified at this time  Skin:  Skin Assessment: Skin Integrity Issues: Skin Integrity Issues:: Incisions Wound Vac: wound VAC placed to midline surgical incision Incisions: midline surgical incision  Last BM:  no documented output via colostomy at this time  Height:   Ht Readings from Last 1 Encounters:  08/24/22 5\' 3"  (1.6 m)   Weight:   Wt Readings from Last 1 Encounters:  08/27/22 96.1 kg   Ideal Body Weight:  52.3 kg  BMI:  Body mass index is 37.53 kg/m.  Estimated Nutritional Needs:   Kcal:  1900-2100  Protein:  105-125g  Fluid:  >/=1.9L  Letta Median, MS, RD, LDN, CNSC Pager number available on Amion

## 2022-08-30 NOTE — Evaluation (Signed)
Occupational Therapy Evaluation Patient Details Name: Brittney Herrera MRN: [id number] DOB: Feb 28, 1975 Today's Date: 08/30/2022   History of Present Illness 47 y/o F presented 08/24/22 with acute onset of pelvic cramping and pain. Found to have free air in her pelvis and mediastinum likely secondary to perforated colon. 7/02 exp lap finding large retroperitoneal abscess and underwent small bowel resection, placed pelvic drain, and VAC dressing. Remained sedated on vent post-op. Return to OR 08/26/22 for small bowel resection, drainage intra-abdominal abscess, colostomy. Extubated 7/05;  PMH anxiety, hypothyroidism, RA (not on steroids or biologics), who has had a known pelvic mass that is follow by Dr. Pricilla Holm with [facility].  She underwent attempted resection in 2022 but given this was so adhered to multiple surrounding structures, it was unable to be removed.   Clinical Impression   Pt presents with decline in function and safety with ADLs and ADL mobility with impaired strength and endurance. 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 Pt currently limited by pain and requires mod A with rolling in bed but able to use rails to scot self upward in bed with no physical assist, set up/Sup with simple face and hand hygiene at bed level, max A with UB ADLs and total A with LB ADLs, toileting tasks; pt politely declined OOB activity due to pain. Pt would benefit from acute OT services to address impairments to maximize level of function and safety     Recommendations for follow up therapy are one component of a multi-disciplinary discharge planning process, led by the attending physician.  Recommendations may be updated based on patient status, additional functional criteria and insurance authorization.   Assistance Recommended at Discharge Set up Supervision/Assistance  Patient can return home with  the following A lot of help with bathing/dressing/bathroom;Assistance with cooking/housework;Assist for transportation;Help with stairs or ramp for entrance    Functional Status Assessment  Patient has had a recent decline in their functional status and demonstrates the ability to make significant improvements in function in a reasonable and predictable amount of time.  Equipment Recommendations  None recommended by OT    Recommendations for Other Services       Precautions / Restrictions Precautions Precautions: Fall;Other (comment) Precaution Comments: new colostomy, drain Left abd, NG tube, foley, O2, IVs Restrictions Weight Bearing Restrictions: No      Mobility Bed Mobility Overal bed mobility: Needs Assistance Bed Mobility: Rolling Rolling: Mod assist         General bed mobility comments: cues for technique to protect abdomen; increased time due to pt fearful of anticipated pain. Pt able to use rails above head to position herself upwards in bed    Transfers                   General transfer comment: pt declined due to pain      Balance                                           ADL either performed or assessed with clinical judgement   ADL Overall ADL's : Needs assistance/impaired     Grooming: Wash/dry hands;Wash/dry face;Set up;Supervision/safety;Bed level   Upper Body Bathing: Maximal assistance Upper Body Bathing Details (indicate cue type and reason): simulated Lower Body Bathing: Total assistance  Upper Body Dressing : Maximal assistance   Lower Body Dressing: Total assistance       Toileting- Clothing Manipulation and Hygiene: Total assistance;Bed level         General ADL Comments: pt declined sitting EOB or attempting transfers this session     Vision Baseline Vision/History: 1 Wears glasses Ability to See in Adequate Light: 0 Adequate Patient Visual Report: No change from baseline       Perception      Praxis      Pertinent Vitals/Pain Pain Assessment Pain Assessment: Faces Faces Pain Scale: Hurts even more Pain Location: abdomen Pain Descriptors / Indicators: Discomfort, Guarding Pain Intervention(s): Limited activity within patient's tolerance, Monitored during session, Repositioned, Patient requesting pain meds-RN notified     Hand Dominance Right   Extremity/Trunk Assessment Upper Extremity Assessment Upper Extremity Assessment: Generalized weakness   Lower Extremity Assessment Lower Extremity Assessment: Defer to PT evaluation   Cervical / Trunk Assessment Cervical / Trunk Assessment: Other exceptions Cervical / Trunk Exceptions: abdominal surgery with colostomy, VAC, and JP drain   Communication Communication Communication: No difficulties   Cognition Arousal/Alertness: Awake/alert Behavior During Therapy: Restless, Anxious Overall Cognitive Status: Within Functional Limits for tasks assessed                                       General Comments       Exercises     Shoulder Instructions      Home Living Family/patient expects to be discharged to:: Private residence Living Arrangements: Children;Parent Available Help at Discharge: Family;Available 24 hours/day Type of Home: House Home Access: Level entry     Home Layout: Two level;Able to live on main level with bedroom/bathroom     Bathroom Shower/Tub: Tub/shower unit;Walk-in shower   Bathroom Toilet: Standard     Home Equipment: None          Prior Functioning/Environment Prior Level of Function : Independent/Modified Independent               ADLs Comments: Pt was Ind Nurse, children's, IADLs, 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        OT Problem List: Decreased strength;Pain;Decreased activity tolerance;Decreased knowledge of use of DME or AE      OT Treatment/Interventions: Self-care/ADL training;DME and/or AE  instruction;Therapeutic activities;Patient/family education    OT Goals(Current goals can be found in the care plan section) Acute Rehab OT Goals Patient Stated Goal: feel better, go home OT Goal Formulation: With patient/family Time For Goal Achievement: 09/13/22 Potential to Achieve Goals: Good ADL Goals Pt Will Perform Grooming: with supervision;with set-up;sitting Pt Will Perform Upper Body Bathing: with mod assist;with min assist;sitting Pt Will Perform Lower Body Bathing: with max assist;with mod assist;sitting/lateral leans;with adaptive equipment Pt Will Perform Upper Body Dressing: with mod assist;with min assist;sitting Pt Will Perform Lower Body Dressing: with max assist;with mod assist;with adaptive equipment Pt Will Transfer to Toilet: with mod assist;with min assist Pt Will Perform Toileting - Clothing Manipulation and hygiene: with mod assist  OT Frequency: Min 2X/week    Co-evaluation              AM-PAC OT "6 Clicks" Daily Activity     Outcome Measure Help from another person eating meals?: Total Help from another person taking care of personal grooming?: A Little Help from another person toileting, which includes using  toliet, bedpan, or urinal?: Total Help from another person bathing (including washing, rinsing, drying)?: A Lot Help from another person to put on and taking off regular upper body clothing?: A Lot Help from another person to put on and taking off regular lower body clothing?: Total 6 Click Score: 10   End of Session    Activity Tolerance: No increased pain;Patient limited by fatigue Patient left: in bed;with call bell/phone within reach;with family/visitor present;with nursing/sitter in room  OT Visit Diagnosis: Other abnormalities of gait and mobility (R26.89);Pain;Muscle weakness (generalized) (M62.81) Pain - part of body:  (abdomen)                Time: 3235-5732 OT Time Calculation (min): 19 min Charges:  OT General Charges $OT Visit:  1 Visit OT Evaluation $OT Eval Low Complexity: 1 Low    Galen Manila 08/30/2022, 1:37 PM

## 2022-08-30 NOTE — Progress Notes (Signed)
Progress Note  3 Days Post-Op  Subjective: Cc abdominal discomfort, worse with movement. Temporarily relieved by IV pain meds. Has not been OOB since Saturday. Denies nausea. Reports some gas via colostomy. Expresses concern about if ostomy were to be irreversible.   NG > 1500 mL/24h  Objective: Vital signs in last 24 hours: Temp:  [97.5 F (36.4 C)-98.7 F (37.1 C)] 97.6 F (36.4 C) (07/07 0835) Pulse Rate:  [68-95] 83 (07/07 0835) Resp:  [11-24] 14 (07/07 0835) BP: (122-165)/(85-99) 144/93 (07/07 0835) SpO2:  [89 %-96 %] 93 % (07/07 0835) Last BM Date :  (pta)  Intake/Output from previous day: 07/06 0701 - 07/07 0700 In: 726 [I.V.:499.7; IV Piggyback:226.3] Out: 510 [Urine:450; Drains:60] Intake/Output this shift: No intake/output data recorded.  PE: General: WD, obese female who is laying in bed in NAD Heart: regular, rate, and rhythm.   Lungs: CTAB, no wheezes, rhonchi, or rales noted.  Respiratory effort nonlabored Abd: soft, generalized ttp, midline wound clean, stoma viable with small volume gas in bag,   NGT - 1,550 mL/24h  Ostomy - no gas/ stool in pouch during my exam  JP - 10 mL/24h SS Psych: A&Ox3 with an anxious affect.    Lab Results:  Recent Labs    08/28/22 0536 08/29/22 0432  WBC 14.9* 16.5*  HGB 9.3* 9.0*  HCT 29.6* 28.5*  PLT 283 356   BMET Recent Labs    08/28/22 0536 08/29/22 0432  NA 140 139  K 3.7 3.4*  CL 104 102  CO2 28 28  GLUCOSE 132* 138*  BUN 17 15  CREATININE 0.73 0.81  CALCIUM 7.7* 7.8*   PT/INR No results for input(s): "LABPROT", "INR" in the last 72 hours. CMP     Component Value Date/Time   NA 139 08/29/2022 0432   K 3.4 (L) 08/29/2022 0432   CL 102 08/29/2022 0432   CO2 28 08/29/2022 0432   GLUCOSE 138 (H) 08/29/2022 0432   BUN 15 08/29/2022 0432   CREATININE 0.81 08/29/2022 0432   CALCIUM 7.8 (L) 08/29/2022 0432   PROT 4.6 (L) 08/27/2022 0504   ALBUMIN 1.8 (L) 08/27/2022 0504   AST 25 08/27/2022 0504    ALT 20 08/27/2022 0504   ALKPHOS 43 08/27/2022 0504   BILITOT 1.7 (H) 08/27/2022 0504   GFRNONAA >60 08/29/2022 0432   Lipase     Component Value Date/Time   LIPASE 15 08/25/2022 0416       Studies/Results: No results found.  Anti-infectives: Anti-infectives (From admission, onward)    Start     Dose/Rate Route Frequency Ordered Stop   08/24/22 2200  piperacillin-tazobactam (ZOSYN) IVPB 3.375 g        3.375 g 12.5 mL/hr over 240 Minutes Intravenous Every 8 hours 08/24/22 1635 08/31/22 2159   08/24/22 1430  piperacillin-tazobactam (ZOSYN) IVPB 3.375 g        3.375 g 100 mL/hr over 30 Minutes Intravenous  Once 08/24/22 1423 08/24/22 1608        Assessment/Plan  Pneumoperitoneum, retroperitoneal air, mediastinal air likely secondary to perforated rectosigmoid colon with history of pelvic mass  7/2: ex lap extensive LOA, SBR in discontinuity, placement of pelvic drain, placement of ABThera vacuum dressing, Dr. Corliss Skains   7/4: ex lap, hartmann's procedure,  washout, small bowel anastomosis (re-establish continuity), drainage LUQ IAA, abdominal closure, Dr. Cliffton Asters -  POD#6, POD#4 - WBC 15.5 from 16.5  - continue NG to LIWS and await bowel function - WOC following for new colostomy  and new vac to midline 7/8 - NPWT ordered to midline M/W/F -cont abx therapy - cont JP drain - daily labs - TPN anticipate ileus - psych c/s for acute stress reaction   FEN - NPO/NGT LIWS, TPN VTE - lovenox ID - zosyn foley - D/C today, TOV  AKI - resolved Septic shock - secondary to above, appreciate CCM assistance Post op respiratory failure - Weaned to extubation today, appreciate CCM assistance - adjust pain medications Hypokalemia - continue fluids, monitor Hyponatremia - improved Hypothyroidism - IV synthroid daily Anxiety  RA    LOS: 5 days    Hosie Spangle, [facility] Surgery 08/29/2022, 12:10 PM Please see Amion for pager number during day hours  7:00am-4:30pm

## 2022-08-30 NOTE — Consult Note (Signed)
Brief Psychiatry Consult Note  Psychiatry was consulted by trauma team for "acute stress reaction" in this patient with emergent ex-lap due to abscess and anticiapted prolonged NPO. She has now refused psychiatric evaluation 3 times, citing both fatigue and fear of paying a consultation fee. On exam today, she was appropriately future oriented (asking for follow up resources, looking forward to leaving the hospital) and denied SI, HI, and AH/VH; husband at bedside had no immediate safety concerns. Provided brief psychoeducation on delirium vs PTSD ad pt has had worsening nightmares since she left the ICU.   - pt declined full consult - resources placed in chart.   We will sign off at this time. This has been communicated to the primary team. If issues arise in the future, don't hesitate to reconsult the Psychiatry Inpatient Consult Service.   Hermina Barnard A Danae Oland

## 2022-08-30 NOTE — Progress Notes (Signed)
Physical Therapy Treatment Patient Details Name: Brittney Herrera MRN: [id number] DOB: 1975-03-09 Today's Date: 08/30/2022   History of Present Illness 47 y/o F presented 08/24/22 with acute onset of pelvic cramping and pain. Found to have free air in her pelvis and mediastinum likely secondary to perforated colon. 7/02 exp lap finding large retroperitoneal abscess and underwent small bowel resection, placed pelvic drain, and VAC dressing. Remained sedated on vent post-op. Return to OR 08/26/22 for small bowel resection, drainage intra-abdominal abscess, colostomy. Extubated 7/05;  PMH anxiety, hypothyroidism, RA (not on steroids or biologics), who has had a known pelvic mass that is follow by Dr. Pricilla Holm with [facility].  She underwent attempted resection in 2022 but given this was so adhered to multiple surrounding structures, it was unable to be removed.    PT Comments  Pt received in supine, agreeable to therapy session with encouragement, with good participation and improved tolerance for transfer training this date. Pt needing up to +2 modA for safety with bed mobility due to abdominal discomfort/lines and up to +2 minA for sit<>stand and lateral steps at bedside to simulate pivot transfer with RW. Pt expressed improvement in pain after mobilizing and PTA encouraged pt to get OOB to chair later in evening or next date to continue progressing/maintaining strength and for endurance/pulmonary clearance. Pt continues to benefit from PT services to progress toward functional mobility goals.      Assistance Recommended at Discharge Set up Supervision/Assistance  If plan is discharge home, recommend the following:  Can travel by private vehicle    Two people to help with walking and/or transfers;A lot of help with bathing/dressing/bathroom;Assistance with cooking/housework;Direct supervision/assist for medications management;Direct supervision/assist for financial management;Assist for transportation;Help  with stairs or ramp for entrance      Equipment Recommendations  Rolling walker (2 wheels)    Recommendations for Other Services       Precautions / Restrictions Precautions Precautions: Fall;Other (comment) Precaution Comments: new colostomy, JP drain R abd, NG tube, foley, O2, PICC RUE Restrictions Weight Bearing Restrictions: No     Mobility  Bed Mobility Overal bed mobility: Needs Assistance Bed Mobility: Rolling, Sidelying to Sit, Sit to Sidelying Rolling: Min assist, +2 for safety/equipment Sidelying to sit: Mod assist, HOB elevated, +2 for safety/equipment     Sit to sidelying: Mod assist, +2 for safety/equipment General bed mobility comments: cues for technique to protect abd; incr time due to pt fearful of anticipated pain and due to multiple lines, NT assisting for safety due to multiple lines and pt anxiety.    Transfers Overall transfer level: Needs assistance Equipment used: Rolling walker (2 wheels) Transfers: Sit to/from Stand, Bed to chair/wheelchair/BSC Sit to Stand: Min assist, +2 safety/equipment   Step pivot transfers: Min assist, +2 safety/equipment       General transfer comment: +2 for line mgmt, pt stepping ~24ft toward her R along EOB to simulate step pivot transfer but defer OOB to chair as pt about to be transported to new unit; Pt agreeable to sit up in chair later in day if time allows with staffing. Pt reports feeling much less pain with standing/sitting upright. Fair RW mgmt, cues for step sequencing/safety due to pt anxiety    Ambulation/Gait                   Stairs             Wheelchair Mobility     Tilt Bed    Modified Rankin (Stroke Patients Only)  Balance Overall balance assessment: Mild deficits observed, not formally tested                                          Cognition Arousal/Alertness: Awake/alert Behavior During Therapy: Restless, Anxious Overall Cognitive Status:  Within Functional Limits for tasks assessed                                 General Comments: Good effort after initial encouragement. Pt surprised at how much better she felt when mobilizing/standing. Anticipate pt not yet back to baseline.        Exercises Other Exercises Other Exercises: reviewed supine/seated BLE AROM: ankle pumps, SAQ, LAQ x10 reps ea Other Exercises: standing hip flexion x10 reps ea (limited ROM on LLE 2/2 pain)    General Comments General comments (skin integrity, edema, etc.): SpO2 and BP WFL on RA; cues for pursed-lip breathing and manual splinting due to pain      Pertinent Vitals/Pain Pain Assessment Pain Assessment: Faces Faces Pain Scale: Hurts even more Pain Location: lower back and abdomen, depending on posture; LBP improves after sitting/standing Pain Descriptors / Indicators: Discomfort, Guarding, Grimacing, Moaning Pain Intervention(s): Limited activity within patient's tolerance, Monitored during session, Premedicated before session, Repositioned, Other (comment) (reviewed manual splinting PRN)     PT Goals (current goals can now be found in the care plan section) Acute Rehab PT Goals Patient Stated Goal: return home PT Goal Formulation: With patient Time For Goal Achievement: 09/11/22 Progress towards PT goals: Progressing toward goals    Frequency    Min 3X/week      PT Plan Current plan remains appropriate       AM-PAC PT "6 Clicks" Mobility   Outcome Measure  Help needed turning from your back to your side while in a flat bed without using bedrails?: A Little Help needed moving from lying on your back to sitting on the side of a flat bed without using bedrails?: A Lot Help needed moving to and from a bed to a chair (including a wheelchair)?: A Lot Help needed standing up from a chair using your arms (e.g., wheelchair or bedside chair)?: A Lot Help needed to walk in hospital room?: Total Help needed climbing 3-5  steps with a railing? : Total 6 Click Score: 11    End of Session Equipment Utilized During Treatment: Other (comment) (defer gait belt given ostomy and multiple lines; +2 safety standing) Activity Tolerance: Patient tolerated treatment well Patient left: in bed;with call bell/phone within reach;with bed alarm set;with family/visitor present;Other (comment) (heels floated, HOB >30* and locked for pt safety to prevent pt lowering HOB below 30* with NGT to suction) Nurse Communication: Mobility status;Patient requests pain meds PT Visit Diagnosis: Other abnormalities of gait and mobility (R26.89);Pain Pain - part of body:  (lower back and abdomen)     Time: 7824-2353 PT Time Calculation (min) (ACUTE ONLY): 38 min  Charges:    $Therapeutic Exercise: 8-22 mins $Therapeutic Activity: 23-37 mins PT General Charges $$ ACUTE PT VISIT: 1 Visit                     Jenice Leiner P., PTA Acute Rehabilitation Services Secure Chat Preferred 9a-5:30pm Office: [phone]    Dorathy Kinsman [facility] 08/30/2022, 7:18 PM

## 2022-08-30 NOTE — Plan of Care (Signed)

## 2022-08-30 NOTE — Discharge Instructions (Addendum)
For psychiatric followup  The best way I have found to locate a good therapist is to go to [email or web address], put in your insurance and the type of therapy you are seeking, and look for someone who seems like a good fit who is taking patients. You can also try the locations below  For psychiatric followup, please contact one of the following facilities to schedule an appointment:  Please contact one of the following facilities to start medication management and therapy services:   [facility] [address]  [place], [place] [address] [phone]   [facility]  [address] [place], [place] [address] [phone]  [facility] [place]  [address] Vella Raring [place], [place] [address] [phone]  [facility]  [address]  [place], [place] [address] [phone]  [facility]  [address] [place], [place] [address] [phone]  [facility]  [address],  [place], [place] [address] [phone]

## 2022-08-31 LAB — GLUCOSE, CAPILLARY
Glucose-Capillary: 154 mg/dL — ABNORMAL HIGH (ref 70–99)
Glucose-Capillary: 155 mg/dL — ABNORMAL HIGH (ref 70–99)
Glucose-Capillary: 165 mg/dL — ABNORMAL HIGH (ref 70–99)
Glucose-Capillary: 179 mg/dL — ABNORMAL HIGH (ref 70–99)

## 2022-08-31 LAB — BASIC METABOLIC PANEL
Anion gap: 14 (ref 5–15)
BUN: 10 mg/dL (ref 6–20)
CO2: 25 mmol/L (ref 22–32)
Calcium: 8 mg/dL — ABNORMAL LOW (ref 8.9–10.3)
Chloride: 94 mmol/L — ABNORMAL LOW (ref 98–111)
Creatinine, Ser: 0.66 mg/dL (ref 0.44–1.00)
GFR, Estimated: >60 mL/min (ref >60)
Glucose, Bld: 159 mg/dL — ABNORMAL HIGH (ref 70–99)
Potassium: 4.4 mmol/L (ref 3.5–5.1)
Sodium: 133 mmol/L — ABNORMAL LOW (ref 135–145)

## 2022-08-31 LAB — CREATININE, SERUM
Creatinine, Ser: 0.64 mg/dL (ref 0.44–1.00)
GFR, Estimated: >60 mL/min (ref >60)

## 2022-08-31 LAB — MAGNESIUM: Magnesium: 1.8 mg/dL (ref 1.7–2.4)

## 2022-08-31 LAB — PHOSPHORUS: Phosphorus: 3.2 mg/dL (ref 2.5–4.6)

## 2022-08-31 LAB — SURGICAL PATHOLOGY

## 2022-08-31 MED ORDER — KETOROLAC TROMETHAMINE 15 MG/ML IJ SOLN
15.0000 mg | Freq: Four times a day (QID) | INTRAMUSCULAR | Status: DC
  Administered 2022-08-31 – 2022-09-01 (×6): 15 mg via INTRAVENOUS
  Filled 2022-08-31 (×6): qty 1

## 2022-08-31 MED ORDER — ACETAMINOPHEN 10 MG/ML IV SOLN
1000.0000 mg | Freq: Four times a day (QID) | INTRAVENOUS | Status: AC
  Administered 2022-08-31 – 2022-09-01 (×4): 1000 mg via INTRAVENOUS
  Filled 2022-08-31 (×4): qty 100

## 2022-08-31 MED ORDER — MAGNESIUM SULFATE 2 GM/50ML IV SOLN
2.0000 g | Freq: Once | INTRAVENOUS | Status: AC
  Administered 2022-08-31: 2 g via INTRAVENOUS
  Filled 2022-08-31: qty 50

## 2022-08-31 MED ORDER — LIDOCAINE 5 % EX PTCH
1.0000 | MEDICATED_PATCH | CUTANEOUS | Status: DC
  Administered 2022-08-31 – 2022-09-08 (×9): 1 via TRANSDERMAL
  Filled 2022-08-31 (×10): qty 1

## 2022-08-31 MED ORDER — HYDROMORPHONE HCL 1 MG/ML IJ SOLN
1.0000 mg | INTRAMUSCULAR | Status: DC | PRN
  Administered 2022-08-31 – 2022-09-02 (×16): 1 mg via INTRAVENOUS
  Filled 2022-08-31 (×15): qty 1

## 2022-08-31 MED ORDER — TRAVASOL 10 % IV SOLN
INTRAVENOUS | Status: AC
  Filled 2022-08-31: qty 1244.4

## 2022-08-31 NOTE — Progress Notes (Signed)
Progress Note  5 Days Post-Op  Subjective: abdominal discomfort, worse with movement. Temporarily relieved by IV pain meds. Got up to sit on edge of bed this morning.  NGT still with high output    Objective: Vital signs in last 24 hours: Temp:  [97.8 F (36.6 C)-98.9 F (37.2 C)] 97.9 F (36.6 C) (07/09 0803) Pulse Rate:  [82-89] 82 (07/09 0803) Resp:  [19] 19 (07/09 0803) BP: (137-155)/(88-95) 155/94 (07/09 0803) SpO2:  [95 %-99 %] 99 % (07/09 0803) Weight:  [97 kg] 97 kg (07/09 0500) Last BM Date : 08/31/22 (ostomy)  Intake/Output from previous day: 07/08 0701 - 07/09 0700 In: 508.4 [IV Piggyback:508.4] Out: -  Intake/Output this shift: Total I/O In: -  Out: 150 [Urine:100; Stool:50]  PE: General: WD, obese female who is laying in bed in NAD Abd: soft, generalized ttp, midline wound with VAC in place, stoma viable with stool in bag. JP with minimal serosang output   Lab Results:  Recent Labs    08/29/22 0432 08/30/22 0428  WBC 16.5* 15.5*  HGB 9.0* 8.9*  HCT 28.5* 27.0*  PLT 356 424*   BMET Recent Labs    08/30/22 0428 08/31/22 0304  NA 137 133*  K 3.4* 4.4  CL 98 94*  CO2 27 25  GLUCOSE 129* 159*  BUN 11 10  CREATININE 0.68 0.66  0.64  CALCIUM 7.8* 8.0*   PT/INR No results for input(s): "LABPROT", "INR" in the last 72 hours. CMP     Component Value Date/Time   NA 133 (L) 08/31/2022 0304   K 4.4 08/31/2022 0304   CL 94 (L) 08/31/2022 0304   CO2 25 08/31/2022 0304   GLUCOSE 159 (H) 08/31/2022 0304   BUN 10 08/31/2022 0304   CREATININE 0.64 08/31/2022 0304   CREATININE 0.66 08/31/2022 0304   CALCIUM 8.0 (L) 08/31/2022 0304   PROT 5.3 (L) 08/30/2022 0428   ALBUMIN 1.5 (L) 08/30/2022 0428   AST 25 08/30/2022 0428   ALT 30 08/30/2022 0428   ALKPHOS 90 08/30/2022 0428   BILITOT 2.1 (H) 08/30/2022 0428   GFRNONAA >60 08/31/2022 0304   GFRNONAA >60 08/31/2022 0304   Lipase     Component Value Date/Time   LIPASE 15 08/25/2022 0416        Studies/Results: No results found.  Anti-infectives: Anti-infectives (From admission, onward)    Start     Dose/Rate Route Frequency Ordered Stop   08/24/22 2200  piperacillin-tazobactam (ZOSYN) IVPB 3.375 g        3.375 g 12.5 mL/hr over 240 Minutes Intravenous Every 8 hours 08/24/22 1635 08/31/22 2159   08/24/22 1430  piperacillin-tazobactam (ZOSYN) IVPB 3.375 g        3.375 g 100 mL/hr over 30 Minutes Intravenous  Once 08/24/22 1423 08/24/22 1608        Assessment/Plan  Pneumoperitoneum, retroperitoneal air, mediastinal air likely secondary to perforated rectosigmoid colon with history of pelvic mass  7/2: ex lap extensive LOA, SBR in discontinuity, placement of pelvic drain, placement of ABThera vacuum dressing, Dr. Corliss Skains   7/4: ex lap, hartmann's procedure,  washout, small bowel anastomosis (re-establish continuity), drainage LUQ IAA, abdominal closure, Dr. Cliffton Asters -  POD#7, POD#5 - WBC 15.5 yesterday, recheck in am - continue NG to LIWS and await bowel function - WOC following for new colostomy and new vac to midline 7/8 - NPWT ordered to midline M/W/F -cont abx therapy - cont JP drain - daily labs - TPN for ileus -  patient refused psych consult -adjust pain meds as able   FEN - NPO/NGT LIWS, TPN VTE - lovenox ID - zosyn foley - voiding  AKI - resolved Septic shock - resolved Post op respiratory failure - doing well on RA Hypokalemia - monitor Hyponatremia - improved Hypothyroidism - IV synthroid daily Anxiety  RA    LOS: 7 days    Letha Cape, [facility] 08/31/2022, 11:21 AM Please see Amion for pager number during day hours 7:00am-4:30pm

## 2022-08-31 NOTE — Progress Notes (Signed)
PHARMACY - TOTAL PARENTERAL NUTRITION CONSULT NOTE   Indication: Prolonged NPO status   Patient Measurements: Height: 5\' 3"  (160 cm) Weight: 97 kg (213 lb 13.5 oz) IBW/kg (Calculated) : 52.4 TPN AdjBW (KG): 60.6 Body mass index is 37.88 kg/m. Usual Weight: 85 kg  Assessment:   47 y.o. F with history of a known pelvic mass that is follow by Dr. Pricilla Holm with [facility].  She underwent attempted resection in 2022 but given this was so adhered to multiple surrounding structures, it was unable to be removed. Biopsies were obtained at this time and this was not found to be malignant. Her last MRI revealed this was smaller in size. She reports being in her normal state of health until Saturday when she developed an acute onset of pelvic cramping and pain. Admitted for septic shock, started on Zosyn. Leukocytosis now resolved, improved hemodynamics. Renal function improving. Anticipated prolonged NPO status per Surgery. Pharmacy consulted to initiate TPN.   Glucose / Insulin: no hx DM, CBGs <180, used 4u SSI/24h  Electrolytes: Na 133, Cl 94, K 4.4 (received 60 mEq IV), Mg 1.8 (received 2g IV), CoCa 9.1, others wnl Renal: Scr 0.64 (bsl <1), BUN wnl Hepatic: LFTs wnl, tbili 2.1, Alb 1.5, Hx hypertriglyceridemia TG 312 Intake / Output; MIVF: UOP charted, NGT per RN, drains 50 mL. Net + 6.5 L  GI Imaging: 7/2 CT abd: Pneumomediastinum extending into the lower neck. There is pneumoperitoneum in the upper abdomen suggesting possible bowel perforation. Fatty liver. GI Surgeries / Procedures:  7/2 OR for ex-lap, area of small bowel resection - left in discontinuity with open abdomen 7/4  OR for washout, hartmann's procedure, SB re-anastomosis, abdominal closure   Central access: 7/3 PICC  TPN start date: 7/4    Nutritional Goals: Goal TPN rate without lipids is 90 mL/hr (provides 125g protein and 1897 kcals)  RD Assessment: Estimated Needs Total Energy Estimated Needs: 1900-2100 Total  Protein Estimated Needs: 105-125g Total Fluid Estimated Needs: >/=1.9L  Current Nutrition:  NPO - ok for sips/chips 7/6 + TPN  Plan:  Add 10% lipids to TPN to goal 85 mL/hr at 1800, provides 124g protein and 1926 kcals, meeting 100% estimated needs Electrolytes in TPN: increase Na 125 mEq/L, decrease K 30 mEq/L, Ca 5 mEq/L, increase Mg 10 mEq/L, increase Phos 14 mmol/L. Change Cl:Ac 2:1 Give 2g mg IV  Continue standard MVI and trace elements to TPN Stop Sensitive q6h SSI and BG checks Thiamine x5d per RD  Monitor TPN labs on Mon/Thurs, daily as needed  F/u triglycerides and increase TPN as able   Thank you for allowing pharmacy to be a part of this patient's care.  Alphia Moh, PharmD, BCPS, BCCP Clinical Pharmacist  Please check AMION for all [facility] Pharmacy phone numbers After 10:00 PM, call Main Pharmacy [phone]

## 2022-08-31 NOTE — TOC Initial Note (Addendum)
Transition of Herrera (TOC) - Initial/Assessment Note  Discussed home needs with [facility] PA Spoke to patient and husband at bedside. Confirmed face sheet information.   Patient currently with PICC, TPN, JP, ostomy and VAC.   Explained prior to discharge home VAC will be delivered to patient's Herrera room along with dressing supplies for Brittney Herrera. Herrera nurse will disconnect Herrera Brittney Herrera and connect home VAC on day of discharge. Home VAC is much smaller then Herrera Brittney Herrera and comes with shoulder bag to carry it around in.   PA has signed home Brittney Herrera prescription. NCM sent home Brittney Herrera prescription to Brittney Herrera with Brittney Herrera. Once insurance approves home VAC NCM will bring to patient's room.   HHRN will change dressing three times a week at home. Home PT is usually around 1 to 2 times a week for about a hour at a time.   PT recommending walker for home. NCM will order walker closer to discharge and it will be delivered to Herrera room prior to discharge   Wound Herrera nurse will enroll patient in program for ostomy supplies for home. On day of discharge Herrera nurse will provide patient will some ostomy supplies also.   Herrera nurse will provide patient and husband education on JP .   WOC and nurse will also provide education on ostomy .   Patient and husband both voiced understanding.   Brittney Herrera will need home health orders  Patient Details  Name: Brittney Herrera MRN: [id number] Date of Birth: 01-16-1976  Transition of Her[facility]) CM/SW Contact:    Brittney Plan, RN Phone Number: 08/31/2022, 11:48 AM  Clinical Narrative:                   Expected Discharge Herrera: Home w Home Health Services Barriers to Discharge: Continued Medical Work up   Patient Goals and CMS Choice Patient states their goals for this hospitalization and ongoing recovery are:: to return to home CMS Medicare.gov Compare Post Acute Herrera list provided to:: Patient Choice offered to / list presented to : Patient [facility] ownership interest in [facility].provided to:: Patient    Expected Discharge Herrera and Services   Discharge Planning Services: CM Consult Post Acute Herrera Choice: Home Health, Durable Medical Equipment Living arrangements for the past 2 months: Single Family Home                 DME Arranged: Walker rolling         HH Arranged: RN, PT HH Agency: Brittney Herrera Date [facility] Agency Contacted: 08/31/22 Time HH Agency Contacted: 1146 Representative spoke with at [facility] Agency: [name]  Prior Living Arrangements/Services Living arrangements for the past 2 months: Single Family Home Lives with:: Spouse Patient language and need for interpreter reviewed:: Yes Do you feel safe going back to the place where you live?: Yes      Need for Family Participation in Patient Herrera: Yes (Comment) Herrera giver support system in place?: Yes (comment)   Criminal Activity/Legal Involvement Pertinent to Current Situation/Hospitalization: No - Comment as needed  Activities of Daily Living Home Assistive Devices/Equipment: Eyeglasses ADL Screening (condition at time of admission) Patient's cognitive ability adequate to safely complete daily activities?: Yes Is the patient deaf or have difficulty hearing?: No Does the patient have difficulty seeing, even when wearing glasses/contacts?: No Does the patient have difficulty concentrating, remembering, or making decisions?: No Patient able to express need for assistance with ADLs?: Yes Does the patient have difficulty dressing or  bathing?: No Independently performs ADLs?: Yes (appropriate for developmental age) Does the patient have difficulty walking or climbing stairs?: No Weakness of Legs: None Weakness of Arms/Hands: None  Permission Sought/Granted   Permission granted to share information with : Yes, Verbal Permission Granted  Share Information with NAME: husband Brittney Herrera  Permission granted to share info w AGENCY: [facility]         Emotional Assessment Appearance:: Appears stated age Attitude/Demeanor/Rapport: Engaged Affect (typically observed): Accepting Orientation: : Oriented to Self, Oriented to Place, Oriented to  Time, Oriented to Situation Alcohol / Substance Use: Not Applicable Psych Involvement: No (comment)  Admission diagnosis:  Bowel perforation (Brittney Herrera) [K63.1] Pneumoperitoneum [K66.8] Patient Active Problem List   Diagnosis Date Noted   Septic shock (Brittney Herrera) 08/25/2022   Bowel perforation (Brittney Herrera) 08/25/2022   Pneumoperitoneum 08/24/2022   Incisional hernia 01/05/2022   Scalp cyst 11/02/2021   Wound dehiscence, surgical 02/02/2021   Ileus, postoperative (Brittney Herrera) 01/27/2021   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 - [address] [place] [address] Phone: [phone] Fax: [phone]     Social Determinants of Health (SDOH) Social History: SDOH Screenings   Food Insecurity: No Food Insecurity (08/25/2022)  Housing: Low Risk  (08/25/2022)  Transportation Needs: No Transportation Needs (08/25/2022)  Utilities: Not At Risk (08/25/2022)  Tobacco Use: Medium Risk (08/27/2022)   SDOH Interventions:     Readmission Risk Interventions     No data to display

## 2022-08-31 NOTE — Progress Notes (Signed)
Physical Therapy Treatment Patient Details Name: Brittney Herrera MRN: [id number] DOB: 1975/08/08 Today's Date: 08/31/2022   History of Present Illness 47 y/o F presented 08/24/22 with acute onset of pelvic cramping and pain. Found to have free air in her pelvis and mediastinum likely secondary to perforated colon. 7/02 exp lap finding large retroperitoneal abscess and underwent small bowel resection, placed pelvic drain, and VAC dressing. Remained sedated on vent post-op. Return to OR 08/26/22 for small bowel resection, drainage intra-abdominal abscess, colostomy. Extubated 7/05;  PMH anxiety, hypothyroidism, RA (not on steroids or biologics), who has had a known pelvic mass that is follow by Dr. Pricilla Holm with [facility].  She underwent attempted resection in 2022 but given this was so adhered to multiple surrounding structures, it was unable to be removed.    PT Comments  Pt was seen for attempt to get OOB but had just returned from [facility] with nursing assist, asked not to get up again.  Pt did agree to review her exercises, and was able to be assisted to perform a routine for preparing to get OOB to [facility].  Her plan is to progress to home, and encouraged her to do more to prepare.  Pt is aware that rehab team is encouraging more frequency of sessions.  Follow acutely for goals of acute PT.      Assistance Recommended at Discharge Set up Supervision/Assistance  If plan is discharge home, recommend the following:  Can travel by private vehicle    Two people to help with walking and/or transfers;A lot of help with bathing/dressing/bathroom;Assistance with cooking/housework;Direct supervision/assist for medications management;Direct supervision/assist for financial management;Assist for transportation;Help with stairs or ramp for entrance      Equipment Recommendations  Rolling walker (2 wheels)    Recommendations for Other Services       Precautions / Restrictions Precautions Precautions: Fall;Other  (comment) Precaution Comments: new colostomy, JP drain R abd, NG tube, foley, O2, PICC RUE Restrictions Weight Bearing Restrictions: No     Mobility  Bed Mobility Overal bed mobility: Needs Assistance Bed Mobility: Rolling Rolling: Min assist, +2 for physical assistance, +2 for safety/equipment         General bed mobility comments: minimal help to move mainly over pain    Transfers                   General transfer comment: declined    Ambulation/Gait               General Gait Details: deferred   Stairs             Wheelchair Mobility     Tilt Bed    Modified Rankin (Stroke Patients Only)       Balance Overall balance assessment: Needs assistance                                          Cognition Arousal/Alertness: Awake/alert Behavior During Therapy: Anxious Overall Cognitive Status: Within Functional Limits for tasks assessed                                 General Comments: declines to get up because she has just moved with nursing        Exercises General Exercises - Lower Extremity Ankle Circles/Pumps: AAROM, 5 reps Quad Sets: AROM, 10 reps  Gluteal Sets: AROM, 10 reps Long Arc Quad: AROM, 10 reps Heel Slides: AROM, AAROM, 10 reps Hip ABduction/ADduction: AROM, AAROM, 15 reps Hip Flexion/Marching: AROM, AAROM, 10 reps Other Exercises Other Exercises: reviewed bed ex to prep to stand    General Comments General comments (skin integrity, edema, etc.): pt is on room air and did not have drops in sats wtih bed ex      Pertinent Vitals/Pain Pain Assessment Pain Assessment: Faces Faces Pain Scale: Hurts little more Pain Location: abdomen and back Pain Descriptors / Indicators: Guarding, Aching, Discomfort Pain Intervention(s): Limited activity within patient's tolerance, Monitored during session, Premedicated before session, Repositioned    Home Living                           Prior Function            PT Goals (current goals can now be found in the care plan section) Acute Rehab PT Goals Patient Stated Goal: return home    Frequency    Min 3X/week      PT Plan Current plan remains appropriate    Co-evaluation              AM-PAC PT "6 Clicks" Mobility   Outcome Measure  Help needed turning from your back to your side while in a flat bed without using bedrails?: A Little Help needed moving from lying on your back to sitting on the side of a flat bed without using bedrails?: Total Help needed moving to and from a bed to a chair (including a wheelchair)?: Total Help needed standing up from a chair using your arms (e.g., wheelchair or bedside chair)?: A Lot Help needed to walk in hospital room?: A Lot Help needed climbing 3-5 steps with a railing? : Total 6 Click Score: 10    End of Session   Activity Tolerance: Patient tolerated treatment well Patient left: in bed;with call bell/phone within reach;with bed alarm set;with family/visitor present;Other (comment) Nurse Communication: Mobility status PT Visit Diagnosis: Other abnormalities of gait and mobility (R26.89);Pain Pain - Right/Left:  (abdomen) Pain - part of body:  (abdomen)     Time: 1610-9604 PT Time Calculation (min) (ACUTE ONLY): 27 min  Charges:    $Therapeutic Exercise: 8-22 mins $Therapeutic Activity: 8-22 mins PT General Charges $$ ACUTE PT VISIT: 1 Visit           Ivar Drape 08/31/2022, 5:23 PM  Samul Dada, PT PhD Acute Rehab Dept. Number: [facility] [id number] and [facility] [phone]

## 2022-09-01 ENCOUNTER — Inpatient Hospital Stay (HOSPITAL_COMMUNITY): Payer: BC Managed Care – PPO

## 2022-09-01 LAB — CBC
HCT: 25.9 % — ABNORMAL LOW (ref 36.0–46.0)
Hemoglobin: 7.6 g/dL — ABNORMAL LOW (ref 12.0–15.0)
MCH: 32.8 pg (ref 26.0–34.0)
MCHC: 29.3 g/dL — ABNORMAL LOW (ref 30.0–36.0)
MCV: 111.6 fL — ABNORMAL HIGH (ref 80.0–100.0)
Platelets: 640 10*3/uL — ABNORMAL HIGH (ref 150–400)
RBC: 2.32 MIL/uL — ABNORMAL LOW (ref 3.87–5.11)
RDW: 18.4 % — ABNORMAL HIGH (ref 11.5–15.5)
WBC: 16.8 10*3/uL — ABNORMAL HIGH (ref 4.0–10.5)
nRBC: 0.1 % (ref 0.0–0.2)

## 2022-09-01 LAB — GLUCOSE, CAPILLARY: Glucose-Capillary: 149 mg/dL — ABNORMAL HIGH (ref 70–99)

## 2022-09-01 LAB — BASIC METABOLIC PANEL WITH GFR
Anion gap: 8 (ref 5–15)
BUN: 13 mg/dL (ref 6–20)
CO2: 23 mmol/L (ref 22–32)
Calcium: 7.8 mg/dL — ABNORMAL LOW (ref 8.9–10.3)
Chloride: 101 mmol/L (ref 98–111)
Creatinine, Ser: 0.41 mg/dL — ABNORMAL LOW (ref 0.44–1.00)
GFR, Estimated: >60 mL/min
Glucose, Bld: 171 mg/dL — ABNORMAL HIGH (ref 70–99)
Potassium: 4 mmol/L (ref 3.5–5.1)
Sodium: 132 mmol/L — ABNORMAL LOW (ref 135–145)

## 2022-09-01 LAB — MAGNESIUM: Magnesium: 2 mg/dL (ref 1.7–2.4)

## 2022-09-01 LAB — TRIGLYCERIDES: Triglycerides: 242 mg/dL — ABNORMAL HIGH

## 2022-09-01 LAB — PHOSPHORUS: Phosphorus: 3.9 mg/dL (ref 2.5–4.6)

## 2022-09-01 MED ORDER — TRAVASOL 10 % IV SOLN
INTRAVENOUS | Status: AC
  Filled 2022-09-01: qty 1244.4

## 2022-09-01 MED ORDER — PIPERACILLIN-TAZOBACTAM 3.375 G IVPB
3.3750 g | Freq: Three times a day (TID) | INTRAVENOUS | Status: DC
  Administered 2022-09-01 – 2022-09-06 (×14): 3.375 g via INTRAVENOUS
  Filled 2022-09-01 (×14): qty 50

## 2022-09-01 NOTE — Consult Note (Signed)
WOC Nurse wound follow up Patient premedicated prior to start of dressing change.  Used adhesive remover to loosen drape and all adhesive.   Wound type:midline abdominal wound  VAC in place  Measurement: 28 cm  x 10 cm x 4.5 cm  Wound EAV:WUJWJ red, adipose  Drainage (amount, consistency, odor) minimal serosanguinous  mild bleeding with dressing change Periwound:LUQ colostomy, 2 adhesives do not overlap Dressing procedure/placement/frequency:removed 2 pieces black foam and replaced with 2 pieces black foam.  Barrier ring at distal end to promote seal.  Covered with drape and seal achieved at 125 mmHg.  Change Mon.Wed.Fri.   Patient is tearful during procedure.   WOC Nurse ostomy follow up Stoma type/location: LUQ colostomy Stomal assessment/size: 1 1/2" pink and moist Peristomal assessment: midline incision present with NPWT (VAC) dressing Treatment options for stomal/peristomal skin: barrier ring and 2 piece pouch Output minimal soft brown stool in pouch Ostomy pouching: 2pc. With barrier ring  Education provided: spouse at bedside  pouch change performed.  Measured stoma with measuring guide. Patient and spouse reviewing written materials.  He wishes to observe pouch change once more and states he will perform next time.  Enrolled patient in [place] Secure Start Discharge program: Yes 08/30/22 Will follow.  Mike Gip MSN, RN, FNP-BC CWON Wound, Ostomy, Continence Nurse Outpatient [facility] [phone] Pager [phone]

## 2022-09-01 NOTE — Progress Notes (Signed)
OT Cancellation Note  Patient Details Name: Brittney Herrera MRN: [id number] DOB: [birth year]   Cancelled Treatment:    Reason Eval/Treat Not Completed: Pain limiting ability to participate. Pt just recently had dressing changed, RN and family request OT return later. OT will follow up as avialable  Galen Manila 09/01/2022, 11:57 AM

## 2022-09-01 NOTE — Progress Notes (Signed)
PHARMACY - TOTAL PARENTERAL NUTRITION CONSULT NOTE   Indication: Prolonged NPO status   Patient Measurements: Height: 5\' 3"  (160 cm) Weight: 97 kg (213 lb 13.5 oz) IBW/kg (Calculated) : 52.4 TPN AdjBW (KG): 60.6 Body mass index is 37.88 kg/m. Usual Weight: 85 kg  Assessment:   47 y.o. F with history of a known pelvic mass that is follow by Dr. Pricilla Holm with [facility].  She underwent attempted resection in 2022 but given this was so adhered to multiple surrounding structures, it was unable to be removed. Biopsies were obtained at this time and this was not found to be malignant. Her last MRI revealed this was smaller in size. She reports being in her normal state of health until Saturday when she developed an acute onset of pelvic cramping and pain. Admitted for septic shock, started on Zosyn. Leukocytosis now resolved, improved hemodynamics. Renal function improving. Anticipated prolonged NPO status per Surgery. Pharmacy consulted to initiate TPN.   Glucose / Insulin: no hx DM, CBGs <180, off SSI Electrolytes: Na 132, Mg 2 (received 2g IV), CoCa 8.9, others wnl Renal: Scr 0.4 (bsl <1), BUN wnl Hepatic: LFTs wnl, tbili 2.1, Alb 1.5, Hx hypertriglyceridemia TG 242 down Intake / Output; MIVF: UOP 0.7 ml/kg/hr, NGT , drains 5 mL, colostomy 50ml, Net + 7.9 L  GI Imaging: 7/2 CT abd: Pneumomediastinum extending into the lower neck. There is pneumoperitoneum in the upper abdomen suggesting possible bowel perforation. Fatty liver. GI Surgeries / Procedures:  7/2 OR for ex-lap, area of small bowel resection - left in discontinuity with open abdomen 7/4  OR for washout, hartmann's procedure, SB re-anastomosis, abdominal closure   Central access: 7/3 PICC  TPN start date: 7/4    Nutritional Goals: Goal TPN rate without lipids is 90 mL/hr (provides 125g protein and 1897 kcals)  RD Assessment: Estimated Needs Total Energy Estimated Needs: 1900-2100 Total Protein Estimated Needs:  105-125g Total Fluid Estimated Needs: >/=1.9L  Current Nutrition:  NPO w/ sips/ ice chips 7/6 + TPN  Plan:  Increase lipids in TPN at goal 85 mL/hr at 1800, provides 124g protein and 1950 kcals, meeting 100% estimated needs Electrolytes in TPN: increase Na 148 mEq/L (can't do 150 and Cl:Ac 1:1), increase K 38 mEq/L, Ca 5 mEq/L, Mg 10 mEq/L, Phos 14 mmol/L. Change Cl:Ac 1:1 Continue standard MVI and trace elements to TPN Stop thiamine (s/p 3 days) Monitor TPN labs on Mon/Thurs, daily as needed  F/u triglycerides and increase lipids in TPN as able   Thank you for allowing pharmacy to be a part of this patient's care.  Alphia Moh, PharmD, BCPS, BCCP Clinical Pharmacist  Please check AMION for all [facility] Pharmacy phone numbers After 10:00 PM, call Main Pharmacy [phone]

## 2022-09-01 NOTE — Progress Notes (Signed)
6 Days Post-Op   Subjective/Chief Complaint: Complains of pain  Denies nausea   Objective: Vital signs in last 24 hours: Temp:  [97.6 F (36.4 C)-98.3 F (36.8 C)] 98.1 F (36.7 C) (07/10 0552) Pulse Rate:  [77-81] 81 (07/10 0552) Resp:  [16-17] 16 (07/10 0552) BP: (125-148)/(82-85) 125/85 (07/10 0552) SpO2:  [98 %-99 %] 99 % (07/10 0552) Weight:  [97 kg] 97 kg (07/10 0500) Last BM Date : 08/31/22  Intake/Output from previous day: 07/09 0701 - 07/10 0700 In: 4168.9 [I.V.:2979.5; IV Piggyback:1189.4] Out: 2905 [Urine:1550; Emesis/NG output:1300; Drains:5; Stool:50] Intake/Output this shift: No intake/output data recorded.  Exam: Awake and alert Abdomen soft, VAC in place, ostomy pink with stool in the bag Drain serosang  Lab Results:  Recent Labs    08/30/22 0428  WBC 15.5*  HGB 8.9*  HCT 27.0*  PLT 424*   BMET Recent Labs    08/31/22 0304 09/01/22 0425  NA 133* 132*  K 4.4 4.0  CL 94* 101  CO2 25 23  GLUCOSE 159* 171*  BUN 10 13  CREATININE 0.66  0.64 0.41*  CALCIUM 8.0* 7.8*   PT/INR No results for input(s): "LABPROT", "INR" in the last 72 hours. ABG No results for input(s): "PHART", "HCO3" in the last 72 hours.  Invalid input(s): "PCO2", "PO2"  Studies/Results: No results found.  Anti-infectives: Anti-infectives (From admission, onward)    Start     Dose/Rate Route Frequency Ordered Stop   08/24/22 2200  piperacillin-tazobactam (ZOSYN) IVPB 3.375 g        3.375 g 12.5 mL/hr over 240 Minutes Intravenous Every 8 hours 08/24/22 1635 08/31/22 1950   08/24/22 1430  piperacillin-tazobactam (ZOSYN) IVPB 3.375 g        3.375 g 100 mL/hr over 30 Minutes Intravenous  Once 08/24/22 1423 08/24/22 1608       Assessment/Plan:  Pneumoperitoneum, retroperitoneal air, mediastinal air likely secondary to perforated rectosigmoid colon with history of pelvic mass  7/2: ex lap extensive LOA, SBR in discontinuity, placement of pelvic drain, placement of  ABThera vacuum dressing, Dr. Corliss Skains   7/4: ex lap, hartmann's procedure,  washout, small bowel anastomosis (re-establish continuity), drainage LUQ IAA, abdominal closure, Dr. Cliffton Asters POD#8/#5  Awaiting CBC NG output still high.  Ostomy is productive.  Checking abdominal xray for bowel gas pattern. Continue antibiotics.  May need repeat CT if WBC continues to increase Continue TPN Continue all other care   Abigail Miyamoto 09/01/2022

## 2022-09-02 ENCOUNTER — Inpatient Hospital Stay (HOSPITAL_COMMUNITY): Payer: BC Managed Care – PPO

## 2022-09-02 LAB — COMPREHENSIVE METABOLIC PANEL
ALT: 24 U/L (ref 0–44)
AST: 17 U/L (ref 15–41)
Albumin: 1.7 g/dL — ABNORMAL LOW (ref 3.5–5.0)
Alkaline Phosphatase: 109 U/L (ref 38–126)
Anion gap: 7 (ref 5–15)
BUN: 12 mg/dL (ref 6–20)
CO2: 23 mmol/L (ref 22–32)
Calcium: 7.9 mg/dL — ABNORMAL LOW (ref 8.9–10.3)
Chloride: 102 mmol/L (ref 98–111)
Creatinine, Ser: 0.66 mg/dL (ref 0.44–1.00)
GFR, Estimated: >60 mL/min (ref >60)
Glucose, Bld: 181 mg/dL — ABNORMAL HIGH (ref 70–99)
Potassium: 4.5 mmol/L (ref 3.5–5.1)
Sodium: 132 mmol/L — ABNORMAL LOW (ref 135–145)
Total Bilirubin: 2 mg/dL — ABNORMAL HIGH (ref 0.3–1.2)
Total Protein: 6.5 g/dL (ref 6.5–8.1)

## 2022-09-02 LAB — CBC
HCT: 26.4 % — ABNORMAL LOW (ref 36.0–46.0)
Hemoglobin: 8.3 g/dL — ABNORMAL LOW (ref 12.0–15.0)
MCH: 32 pg (ref 26.0–34.0)
MCHC: 31.4 g/dL (ref 30.0–36.0)
MCV: 101.9 fL — ABNORMAL HIGH (ref 80.0–100.0)
Platelets: 842 10*3/uL — ABNORMAL HIGH (ref 150–400)
RBC: 2.59 MIL/uL — ABNORMAL LOW (ref 3.87–5.11)
RDW: 17.9 % — ABNORMAL HIGH (ref 11.5–15.5)
WBC: 19.3 10*3/uL — ABNORMAL HIGH (ref 4.0–10.5)
nRBC: 0 % (ref 0.0–0.2)

## 2022-09-02 LAB — TRIGLYCERIDES: Triglycerides: 262 mg/dL — ABNORMAL HIGH (ref <150)

## 2022-09-02 LAB — PHOSPHORUS: Phosphorus: 3.3 mg/dL (ref 2.5–4.6)

## 2022-09-02 LAB — MAGNESIUM: Magnesium: 1.9 mg/dL (ref 1.7–2.4)

## 2022-09-02 MED ORDER — ACETAMINOPHEN 10 MG/ML IV SOLN
1000.0000 mg | Freq: Four times a day (QID) | INTRAVENOUS | Status: AC
  Administered 2022-09-02 – 2022-09-03 (×4): 1000 mg via INTRAVENOUS
  Filled 2022-09-02 (×4): qty 100

## 2022-09-02 MED ORDER — IOHEXOL 9 MG/ML PO SOLN
1000.0000 mL | ORAL | Status: AC
  Administered 2022-09-02 (×2): 500 mL via ORAL

## 2022-09-02 MED ORDER — PHENOL 1.4 % MT LIQD
1.0000 | OROMUCOSAL | Status: DC | PRN
  Administered 2022-09-02: 1 via OROMUCOSAL
  Filled 2022-09-02: qty 177

## 2022-09-02 MED ORDER — IOHEXOL 350 MG/ML SOLN
75.0000 mL | Freq: Once | INTRAVENOUS | Status: AC | PRN
  Administered 2022-09-02: 75 mL via INTRAVENOUS

## 2022-09-02 MED ORDER — ORAL CARE MOUTH RINSE: 15.0000 mL | OROMUCOSAL | Status: DC | PRN

## 2022-09-02 MED ORDER — TRAVASOL 10 % IV SOLN
INTRAVENOUS | Status: AC
  Filled 2022-09-02: qty 1244.4

## 2022-09-02 MED ORDER — IOHEXOL 9 MG/ML PO SOLN: 500.0000 mL | ORAL | Status: DC

## 2022-09-02 MED ORDER — HYDROMORPHONE HCL 1 MG/ML IJ SOLN
1.0000 mg | INTRAMUSCULAR | Status: DC | PRN
  Administered 2022-09-02 – 2022-09-03 (×7): 2 mg via INTRAVENOUS
  Administered 2022-09-03: 1 mg via INTRAVENOUS
  Administered 2022-09-03 – 2022-09-05 (×13): 2 mg via INTRAVENOUS
  Administered 2022-09-05: 1 mg via INTRAVENOUS
  Administered 2022-09-05 (×4): 2 mg via INTRAVENOUS
  Administered 2022-09-06: 1 mg via INTRAVENOUS
  Administered 2022-09-06 (×4): 2 mg via INTRAVENOUS
  Administered 2022-09-06: 1 mg via INTRAVENOUS
  Administered 2022-09-07: 2 mg via INTRAVENOUS
  Filled 2022-09-02 (×7): qty 2
  Filled 2022-09-02: qty 1
  Filled 2022-09-02 (×16): qty 2
  Filled 2022-09-02: qty 1
  Filled 2022-09-02 (×8): qty 2
  Filled 2022-09-02: qty 1

## 2022-09-02 NOTE — Progress Notes (Signed)
PHARMACY - TOTAL PARENTERAL NUTRITION CONSULT NOTE   Indication: Prolonged NPO status   Patient Measurements: Height: 5\' 3"  (160 cm) Weight: 97 kg (213 lb 13.5 oz) IBW/kg (Calculated) : 52.4 TPN AdjBW (KG): 60.6 Body mass index is 37.88 kg/m. Usual Weight: 85 kg  Assessment:   47 y.o. F with history of a known pelvic mass that is follow by Dr. Pricilla Holm with [facility].  She underwent attempted resection in 2022 but given this was so adhered to multiple surrounding structures, it was unable to be removed. Biopsies were obtained at this time and this was not found to be malignant. Her last MRI revealed this was smaller in size. She reports being in her normal state of health until Saturday when she developed an acute onset of pelvic cramping and pain. Admitted for septic shock, started on Zosyn. Leukocytosis now resolved, improved hemodynamics. Renal function improving. Anticipated prolonged NPO status per Surgery. Pharmacy consulted to initiate TPN.   Glucose / Insulin: no hx DM, CBGs <180, off SSI Electrolytes: Na 132, K 4.5 up, Mg 1.9, CoCa 9.7, others wnl Renal: Scr 0.4 (bsl <1), BUN wnl Hepatic: LFTs wnl, tbili 2, Alb 1.7, Hx hypertriglyceridemia TG 262  Intake / Output; MIVF: UOP 0.6 ml/kg/hr, NGT , drains 5 mL, colostomy 50ml, Net + 7.8L  GI Imaging: 7/2 CT abd: Pneumomediastinum extending into the lower neck. There is pneumoperitoneum in the upper abdomen suggesting possible bowel perforation. Fatty liver. GI Surgeries / Procedures:  7/2 OR for ex-lap, area of small bowel resection - left in discontinuity with open abdomen 7/4  OR for washout, hartmann's procedure, SB re-anastomosis, abdominal closure   Central access: 7/3 PICC  TPN start date: 7/4    Nutritional Goals: Goal TPN rate without lipids is 90 mL/hr (provides 125g protein and 1897 kcals)  RD Assessment: Estimated Needs Total Energy Estimated Needs: 1900-2100 Total Protein Estimated Needs: 105-125g Total Fluid  Estimated Needs: >/=1.9L  Current Nutrition:  NPO w/ sips/ ice chips 7/6 + TPN  Plan:  Increase lipids again in TPN at goal 85 mL/hr at 1800, provides 124g protein and 1911 kcals, meeting 100% estimated needs Electrolytes in TPN: Na 148 mEq/L (can't do 150 and Cl:Ac 1:1), decrease K 25 mEq/L, Ca 5 mEq/L, increase Mg 14 mEq/L, Phos 14 mmol/L. Cl:Ac 1:1 Continue standard MVI and trace elements to TPN Monitor TPN labs on Mon/Thurs, daily as needed  F/u triglycerides and increase lipids in TPN as able   Thank you for allowing pharmacy to be a part of this patient's care.  Alphia Moh, PharmD, BCPS, BCCP Clinical Pharmacist  Please check AMION for all [facility] Pharmacy phone numbers After 10:00 PM, call Main Pharmacy [phone]

## 2022-09-02 NOTE — Progress Notes (Signed)
Occupational Therapy Treatment Patient Details Name: Brittney Herrera MRN: [id number] DOB: Jun 26, 1975 Today's Date: 09/02/2022   History of present illness 47 y/o F presented 08/24/22 with acute onset of pelvic cramping and pain. Found to have free air in her pelvis and mediastinum likely secondary to perforated colon. 7/02 exp lap finding large retroperitoneal abscess and underwent small bowel resection, placed pelvic drain, and VAC dressing. Remained sedated on vent post-op. Return to OR 08/26/22 for small bowel resection, drainage intra-abdominal abscess, colostomy. Extubated 7/05;  PMH anxiety, hypothyroidism, RA (not on steroids or biologics), who has had a known pelvic mass that is follow by Dr. Pricilla Holm with [facility].  She underwent attempted resection in 2022 but given this was so adhered to multiple surrounding structures, it was unable to be removed.   OT comments  Pt making good progress with functional goals. Pt pre medicated and agreeable to OOB activity. Pt sat EOB min A, sit - stand min A +2 and to transfer to [facility]. Pt stood at toilet to manage clothing, for anterior hygiene and to pull up underpants. Pt walked with PT using RW and seated in recliner at end of session and instructed to sit up x 1 hour. OT will continue to follow acutely to maximize level of function and safety   Recommendations for follow up therapy are one component of a multi-disciplinary discharge planning process, led by the attending physician.  Recommendations may be updated based on patient status, additional functional criteria and insurance authorization.    Assistance Recommended at Discharge Set up Supervision/Assistance  Patient can return home with the following  A lot of help with bathing/dressing/bathroom;Assistance with cooking/housework;Assist for transportation;Help with stairs or ramp for entrance   Equipment Recommendations  None recommended by OT    Recommendations for Other Services       Precautions / Restrictions Precautions Precautions: Fall;Other (comment) Precaution Comments: new colostomy, JP drain R abd, NG tube, foley, O2, PICC RUE Restrictions Weight Bearing Restrictions: No       Mobility Bed Mobility Overal bed mobility: Needs Assistance Bed Mobility: Rolling Rolling: Min guard Sidelying to sit: HOB elevated, Min assist       General bed mobility comments: pt recalled need to roll to her side prior to sit; used rail and HOB elevated; used therapist's hand to pull up on (pt pulled on therapist, not the reverse)    Transfers Overall transfer level: Needs assistance Equipment used: Rolling walker (2 wheels), None Transfers: Sit to/from Stand, Bed to chair/wheelchair/BSC Sit to Stand: Min assist, +2 safety/equipment, Min guard     Step pivot transfers: Min assist, +2 safety/equipment     General transfer comment: transfer bed to [facility] with no device, holding onto therapist's arm min assist; stood from [facility] x 2 with minguard to RW     Balance Overall balance assessment: Needs assistance Sitting-balance support: No upper extremity supported, Feet supported Sitting balance-Leahy Scale: Good     Standing balance support: No upper extremity supported, During functional activity Standing balance-Leahy Scale: Good                             ADL either performed or assessed with clinical judgement   ADL Overall ADL's : Needs assistance/impaired     Grooming: Wash/dry hands;Wash/dry face;Sitting;Set up                   Toilet Transfer: Minimal assistance;+2 for safety/equipment;Stand-pivot;BSC/3in1   Toileting- Clothing  Manipulation and Hygiene: Moderate assistance Toileting - Clothing Manipulation Details (indicate cue type and reason): pt stood at toilet to manage clothing, for anteriro hygiene and to pull up underpants     Functional mobility during ADLs: Minimal assistance;+2 for safety/equipment;Rolling walker (2 wheels)       Extremity/Trunk Assessment Upper Extremity Assessment Upper Extremity Assessment: Generalized weakness   Lower Extremity Assessment Lower Extremity Assessment: Defer to PT evaluation   Cervical / Trunk Assessment Cervical / Trunk Assessment: Other exceptions Cervical / Trunk Exceptions: abdominal surgery with colostomy, VAC, and JP drain    Vision Baseline Vision/History: 1 Wears glasses Ability to See in Adequate Light: 0 Adequate Patient Visual Report: No change from baseline     Perception     Praxis      Cognition Arousal/Alertness: Awake/alert Behavior During Therapy: Anxious Overall Cognitive Status: Within Functional Limits for tasks assessed                                 General Comments: pt agreeable to get OOB today        Exercises      Shoulder Instructions       General Comments      Pertinent Vitals/ Pain       Pain Assessment Pain Assessment: Faces Faces Pain Scale: Hurts even more Pain Location: abdomen and back Pain Descriptors / Indicators: Guarding, Aching, Discomfort Pain Intervention(s): Limited activity within patient's tolerance, Premedicated before session, Monitored during session, Repositioned  Home Living                                          Prior Functioning/Environment              Frequency  Min 2X/week        Progress Toward Goals  OT Goals(current goals can now be found in the care plan section)  Progress towards OT goals: Progressing toward goals     Plan Discharge plan remains appropriate    Co-evaluation      Reason for Co-Treatment: Complexity of the patient's impairments (multi-system involvement);Other (comment)   OT goals addressed during session: ADL's and self-care;Proper use of Adaptive equipment and DME      AM-PAC OT "6 Clicks" Daily Activity     Outcome Measure   Help from another person eating meals?: None Help from another person taking care  of personal grooming?: A Little Help from another person toileting, which includes using toliet, bedpan, or urinal?: A Lot Help from another person bathing (including washing, rinsing, drying)?: A Lot Help from another person to put on and taking off regular upper body clothing?: A Lot Help from another person to put on and taking off regular lower body clothing?: Total 6 Click Score: 14    End of Session Equipment Utilized During Treatment: Gait belt;Rolling walker (2 wheels);Other (comment) (BSC)  OT Visit Diagnosis: Other abnormalities of gait and mobility (R26.89);Pain;Muscle weakness (generalized) (M62.81) Pain - part of body:  (abdomen)   Activity Tolerance No increased pain;Patient limited by fatigue   Patient Left with call bell/phone within reach;with family/visitor present;in chair   Nurse Communication          Time: 1478-2956 OT Time Calculation (min): 41 min  Charges: OT General Charges $OT Visit: 1 Visit OT Treatments $Self Care/Home Management : 8-22  mins    Margaretmary Eddy [facility] 09/02/2022, 2:33 PM

## 2022-09-02 NOTE — Progress Notes (Signed)
7 Days Post-Op   Subjective/Chief Complaint: Continues to have difficulty with pain control. NG output high. CBC pending this morning. Reports some flatus from ostomy this morning.   Objective: Vital signs in last 24 hours: Temp:  [97.7 F (36.5 C)-99.2 F (37.3 C)] 98 F (36.7 C) (07/11 0748) Pulse Rate:  [86-89] 89 (07/11 0748) Resp:  [16-17] 17 (07/11 0748) BP: (132-151)/(88-97) 141/90 (07/11 0748) SpO2:  [98 %-100 %] 98 % (07/11 0748) Weight:  [97 kg] 97 kg (07/11 0500) Last BM Date : 09/01/22  Intake/Output from previous day: 07/10 0701 - 07/11 0700 In: 2617.3 [I.V.:2075.5; IV Piggyback:541.9] Out: 2755 [Urine:1300; Emesis/NG output:1400; Drains:5; Stool:50] Intake/Output this shift: Total I/O In: 10 [I.V.:10] Out: -   Exam: Awake and alert, NAD HEENT: NG in place with bilious drainage Abdomen soft, VAC in place at midline, ostomy pink with scant stool in bag. JP with purulent drainage.   Lab Results:  Recent Labs    09/01/22 1549  WBC 16.8*  HGB 7.6*  HCT 25.9*  PLT 640*   BMET Recent Labs    09/01/22 0425 09/02/22 0321  NA 132* 132*  K 4.0 4.5  CL 101 102  CO2 23 23  GLUCOSE 171* 181*  BUN 13 12  CREATININE 0.41* 0.66  CALCIUM 7.8* 7.9*   PT/INR No results for input(s): "LABPROT", "INR" in the last 72 hours. ABG No results for input(s): "PHART", "HCO3" in the last 72 hours.  Invalid input(s): "PCO2", "PO2"  Studies/Results: DG Abd 1 View  Result Date: 09/01/2022 CLINICAL DATA:  Ileus EXAM: ABDOMEN - 1 VIEW COMPARISON:  CT abdomen and pelvis with contrast August 24, 2022, abdominal radiograph August 24, 2022 FINDINGS: NG tube is coiled in the stomach. Drainage catheter coiled in the pelvis. Paucity of bowel gas limits evaluation. There is gas and stool seen within the colon in a nonobstructive pattern. Phleboliths seen in the pelvis. The visualized bibasilar lungs clear. No acute osseous abnormality. IMPRESSION: Paucity of bowel gas limits  evaluation. However, there is gas and stool seen in the colon in a nonobstructive pattern. Electronically Signed   By: Jacob Moores M.D.   On: 09/01/2022 08:32    Anti-infectives: Anti-infectives (From admission, onward)    Start     Dose/Rate Route Frequency Ordered Stop   09/01/22 1400  piperacillin-tazobactam (ZOSYN) IVPB 3.375 g        3.375 g 12.5 mL/hr over 240 Minutes Intravenous Every 8 hours 09/01/22 1308 09/08/22 1359   08/24/22 2200  piperacillin-tazobactam (ZOSYN) IVPB 3.375 g        3.375 g 12.5 mL/hr over 240 Minutes Intravenous Every 8 hours 08/24/22 1635 09/01/22 0826   08/24/22 1430  piperacillin-tazobactam (ZOSYN) IVPB 3.375 g        3.375 g 100 mL/hr over 30 Minutes Intravenous  Once 08/24/22 1423 08/24/22 1608       Assessment/Plan: Pneumoperitoneum, retroperitoneal air, mediastinal air likely secondary to perforated rectosigmoid colon with history of pelvic mass  7/2: ex lap extensive LOA, SBR in discontinuity, placement of pelvic drain, placement of ABThera vacuum dressing, Dr. Corliss Skains   7/4: ex lap, hartmann's procedure,  washout, small bowel anastomosis (re-establish continuity), drainage LUQ IAA, abdominal closure, Dr. Cliffton Asters POD#9/#6  - CBC pending, if WBC continues to trend up, plan for CT scan today - Continue NG to LIS, ostomy minimally productive with high NG output. - Continue TPN - Multimodal pain control: continue toradol and robaxin, add IV tylenol, increase prn dilaudid - VTE:  lovenox, SCDs - Dispo: inpatient, med-surg floor   Fritzi Mandes 09/02/2022

## 2022-09-02 NOTE — Progress Notes (Signed)
Physical Therapy Treatment Patient Details Name: Brittney Herrera MRN: [id number] DOB: 07-19-1975 Today's Date: 09/02/2022   History of Present Illness 47 y/o F presented 08/24/22 with acute onset of pelvic cramping and pain. Found to have free air in her pelvis and mediastinum likely secondary to perforated colon. 7/02 exp lap finding large retroperitoneal abscess and underwent small bowel resection, placed pelvic drain, and VAC dressing. Remained sedated on vent post-op. Return to OR 08/26/22 for small bowel resection, drainage intra-abdominal abscess, colostomy. Extubated 7/05;  PMH anxiety, hypothyroidism, RA (not on steroids or biologics), who has had a known pelvic mass that is follow by Dr. Pricilla Holm with [facility].  She underwent attempted resection in 2022 but given this was so adhered to multiple surrounding structures, it was unable to be removed.    PT Comments  Patient requires encouragement and pre-medication for pain, and was able to progress to ambulating with RW with minguard assist x 50 ft (with chair follow). Performed some supine and standing exercises prior to ambulation to "wake up" her legs. Returned to chair with husband present and all lines intact.      Assistance Recommended at Discharge Set up Supervision/Assistance  If plan is discharge home, recommend the following:  Can travel by private vehicle    A lot of help with bathing/dressing/bathroom;Assistance with cooking/housework;Direct supervision/assist for medications management;Direct supervision/assist for financial management;Assist for transportation;Help with stairs or ramp for entrance;A little help with walking and/or transfers      Equipment Recommendations  Rolling walker (2 wheels)    Recommendations for Other Services       Precautions / Restrictions Precautions Precautions: Fall;Other (comment) Precaution Comments: new colostomy, JP drain R abd, NG tube, foley, O2, PICC RUE Restrictions Weight  Bearing Restrictions: No     Mobility  Bed Mobility Overal bed mobility: Needs Assistance Bed Mobility: Rolling Rolling: Min guard Sidelying to sit: HOB elevated, Min assist       General bed mobility comments: pt recalled need to roll to her side prior to sit; used rail and HOB elevated; used therapist's hand to pull up on (pt pulled on therapist, not the reverse)    Transfers Overall transfer level: Needs assistance Equipment used: Rolling walker (2 wheels), None Transfers: Sit to/from Stand, Bed to chair/wheelchair/BSC Sit to Stand: Min assist, +2 safety/equipment, Min guard   Step pivot transfers: Min assist, +2 safety/equipment       General transfer comment: transfer bed to [facility] with no device, holding onto therapist's arm min assist; stood from [facility] x 2 with minguard to RW    Ambulation/Gait Ambulation/Gait assistance: Min guard, +2 safety/equipment Gait Distance (Feet): 50 Feet Assistive device: Rolling walker (2 wheels) Gait Pattern/deviations: Step-through pattern, Decreased stride length, Trunk flexed       General Gait Details: pt primarily flexed in upper trunk/neck; good proximity to RW; chair follow   Stairs             Wheelchair Mobility     Tilt Bed    Modified Rankin (Stroke Patients Only)       Balance Overall balance assessment: Needs assistance Sitting-balance support: No upper extremity supported, Feet supported Sitting balance-Leahy Scale: Good     Standing balance support: No upper extremity supported, During functional activity Standing balance-Leahy Scale: Good                              Cognition Arousal/Alertness: Awake/alert Behavior During Therapy: Anxious  Overall Cognitive Status: Within Functional Limits for tasks assessed                                          Exercises General Exercises - Lower Extremity Ankle Circles/Pumps: AROM, 5 reps Hip Flexion/Marching: AROM, 10 reps,  Standing (with RW) Mini-Sqauts: AROM, Both, 5 reps, Standing Other Exercises Other Exercises: standing ex's prior to ambulation to "wake-up" her legs    General Comments General comments (skin integrity, edema, etc.): Husband present and encouraging pt to participate      Pertinent Vitals/Pain Pain Assessment Pain Assessment: Faces Faces Pain Scale: Hurts even more Pain Location: abdomen and back Pain Descriptors / Indicators: Guarding, Aching, Discomfort Pain Intervention(s): Limited activity within patient's tolerance, Monitored during session, Premedicated before session, Repositioned    Home Living                          Prior Function            PT Goals (current goals can now be found in the care plan section) Acute Rehab PT Goals Patient Stated Goal: return home PT Goal Formulation: With patient Time For Goal Achievement: 09/11/22 Potential to Achieve Goals: Good Progress towards PT goals: Progressing toward goals    Frequency    Min 4X/week      PT Plan Current plan remains appropriate;Frequency needs to be updated    Co-evaluation PT/OT/SLP Co-Evaluation/Treatment: Yes Reason for Co-Treatment: Complexity of the patient's impairments (multi-system involvement);Other (comment) (pt's pain tolerance) PT goals addressed during session: Mobility/safety with mobility;Proper use of DME;Strengthening/ROM        AM-PAC PT "6 Clicks" Mobility   Outcome Measure  Help needed turning from your back to your side while in a flat bed without using bedrails?: A Little Help needed moving from lying on your back to sitting on the side of a flat bed without using bedrails?: A Lot Help needed moving to and from a bed to a chair (including a wheelchair)?: A Little Help needed standing up from a chair using your arms (e.g., wheelchair or bedside chair)?: A Little Help needed to walk in hospital room?: Total Help needed climbing 3-5 steps with a railing? :  Total 6 Click Score: 13    End of Session Equipment Utilized During Treatment: Other (comment) (defer gait belt given ostomy and multiple lines; +2 safety standing) Activity Tolerance: Patient tolerated treatment well Patient left: with call bell/phone within reach;with family/visitor present;in chair Nurse Communication: Mobility status PT Visit Diagnosis: Other abnormalities of gait and mobility (R26.89);Pain Pain - Right/Left:  (abdomen) Pain - part of body:  (abdomen)     Time: 6962-9528 PT Time Calculation (min) (ACUTE ONLY): 41 min  Charges:    $Gait Training: 8-22 mins $Therapeutic Exercise: 8-22 mins PT General Charges $$ ACUTE PT VISIT: 1 Visit                      Jerolyn Center, PT Acute Rehabilitation Services  Office [phone]    Zena Amos 09/02/2022, 10:36 AM

## 2022-09-02 NOTE — Plan of Care (Signed)
  Problem: Activity: Goal: Risk for activity intolerance will decrease Outcome: Progressing   Problem: Nutrition: Goal: Adequate nutrition will be maintained Outcome: Progressing   Problem: Coping: Goal: Level of anxiety will decrease Outcome: Progressing   Problem: Elimination: Goal: Will not experience complications related to bowel motility Outcome: Progressing   Problem: Pain Managment: Goal: General experience of comfort will improve Outcome: Progressing   

## 2022-09-03 ENCOUNTER — Inpatient Hospital Stay (HOSPITAL_COMMUNITY): Payer: BC Managed Care – PPO

## 2022-09-03 LAB — BASIC METABOLIC PANEL
Anion gap: 6 (ref 5–15)
BUN: 11 mg/dL (ref 6–20)
CO2: 25 mmol/L (ref 22–32)
Calcium: 7.9 mg/dL — ABNORMAL LOW (ref 8.9–10.3)
Chloride: 102 mmol/L (ref 98–111)
Creatinine, Ser: 0.56 mg/dL (ref 0.44–1.00)
GFR, Estimated: >60 mL/min (ref >60)
Glucose, Bld: 136 mg/dL — ABNORMAL HIGH (ref 70–99)
Potassium: 4 mmol/L (ref 3.5–5.1)
Sodium: 133 mmol/L — ABNORMAL LOW (ref 135–145)

## 2022-09-03 LAB — CBC
HCT: 24.3 % — ABNORMAL LOW (ref 36.0–46.0)
Hemoglobin: 7.6 g/dL — ABNORMAL LOW (ref 12.0–15.0)
MCH: 33 pg (ref 26.0–34.0)
MCHC: 31.3 g/dL (ref 30.0–36.0)
MCV: 105.7 fL — ABNORMAL HIGH (ref 80.0–100.0)
Platelets: 756 10*3/uL — ABNORMAL HIGH (ref 150–400)
RBC: 2.3 MIL/uL — ABNORMAL LOW (ref 3.87–5.11)
RDW: 17.9 % — ABNORMAL HIGH (ref 11.5–15.5)
WBC: 16.6 10*3/uL — ABNORMAL HIGH (ref 4.0–10.5)
nRBC: 0 % (ref 0.0–0.2)

## 2022-09-03 LAB — TRIGLYCERIDES: Triglycerides: 256 mg/dL — ABNORMAL HIGH (ref <150)

## 2022-09-03 LAB — PHOSPHORUS: Phosphorus: 3.8 mg/dL (ref 2.5–4.6)

## 2022-09-03 LAB — PROTIME-INR
INR: 1.2 (ref 0.8–1.2)
Prothrombin Time: 15 seconds (ref 11.4–15.2)

## 2022-09-03 LAB — MAGNESIUM: Magnesium: 2.1 mg/dL (ref 1.7–2.4)

## 2022-09-03 MED ORDER — FENTANYL CITRATE (PF) 100 MCG/2ML IJ SOLN
INTRAMUSCULAR | Status: AC
  Filled 2022-09-03: qty 2

## 2022-09-03 MED ORDER — ENOXAPARIN SODIUM 40 MG/0.4ML IJ SOSY
40.0000 mg | PREFILLED_SYRINGE | INTRAMUSCULAR | Status: DC
  Administered 2022-09-03 – 2022-09-07 (×5): 40 mg via SUBCUTANEOUS
  Filled 2022-09-03 (×4): qty 0.4

## 2022-09-03 MED ORDER — LIDOCAINE HCL (PF) 1 % IJ SOLN
20.0000 mL | Freq: Once | INTRAMUSCULAR | Status: DC
  Filled 2022-09-03: qty 20

## 2022-09-03 MED ORDER — MIDAZOLAM HCL 2 MG/2ML IJ SOLN
INTRAMUSCULAR | Status: AC | PRN
  Administered 2022-09-03 (×2): 1 mg via INTRAVENOUS

## 2022-09-03 MED ORDER — FENTANYL CITRATE (PF) 100 MCG/2ML IJ SOLN
INTRAMUSCULAR | Status: AC | PRN
  Administered 2022-09-03 (×4): 50 ug via INTRAVENOUS

## 2022-09-03 MED ORDER — MIDAZOLAM HCL 2 MG/2ML IJ SOLN
INTRAMUSCULAR | Status: AC
  Filled 2022-09-03: qty 2

## 2022-09-03 MED ORDER — TRAVASOL 10 % IV SOLN
INTRAVENOUS | Status: AC
  Filled 2022-09-03: qty 1244.4

## 2022-09-03 MED ORDER — STERILE WATER FOR INJECTION IJ SOLN
INTRAMUSCULAR | Status: AC
  Administered 2022-09-03: 10 mL
  Filled 2022-09-03: qty 10

## 2022-09-03 NOTE — Consult Note (Signed)
Chief Complaint: Abdominal abscesses  Referring Provider(s): [facility]  Supervising Physician: Richarda Overlie  Patient Status: [facility] - In-pt  History of Present Illness: Brittney Herrera is a 47 y.o. female with medical issues including anxiety, hypothyroidism, RA (not on steroids or biologics), and a pelvic mass..  Her mass was followed by Dr. Pricilla Holm with GYN/ONC.   She underwent attempted resection in 2022 but it was adhered to multiple surrounding structures and was unable to be removed.    She ended up returning several days later due to concern for a bowel injury which was not identified.    There was thoughts she may have a bladder fistula that was interrupted during her first surgery, but cystoscopy was negative.   Biopsies were done and it was benign.  She has been monitored closely by Dr. Pricilla Holm.  Her last MRI showed it was smaller in size.    In November of 2023, she underwent extensive open incisional hernia repair with Bard mesh with a posterior rectus sheath release and B transversus abdominis release on the right as well by Dr. Dossie Der.     Late June she developed an acute onset of pelvic cramping and pain.    She presented to the [facility] on 08/24/22.  CT showed free air in her pelvis, RTP, and in her mediastinum.    General surgery note reads = Pneumoperitoneum, retroperitoneal air, mediastinal air likely secondary to perforated rectosigmoid colon with history of pelvic mass   -7/2: ex lap extensive LOA, SBR in discontinuity, placement of pelvic drain, placement of ABThera vacuum dressing, Dr. Corliss Skains   -7/4: ex lap, hartmann's procedure,  washout, small bowel anastomosis.  CT abd done due to pain from today. WBC is 16.6, Hgb 7.6, Total bilirubin from 7.11.24 2.0.    CT scan today showed= Postoperative changes of recent partial colectomy with left lower quadrant colostomy and partial small bowel resection, with evidence of peritonitis and multiple  intra-abdominal abscesses, as detailed above.  We are asked to evaluate her for drainage of pelvic abscess.  Patient is Full Code  Past Medical History:  Diagnosis Date   Arthritis    Bicornuate uterus    Hemorrhoids 11/18/2020   History of abnormal cervical Pap smear    Hypertriglyceridemia 11/18/2020   Hypothyroidism    Rheumatoid aortitis    Thyroid disease    Umbilical hernia 11/28/2020    Past Surgical History:  Procedure Laterality Date   ABDOMINAL ADVANCEMENT FLAP Bilateral 01/05/2022   Procedure: ABDOMINAL ADVANCEMENT FLAP;  Surgeon: Quentin Ore, MD;  Location: [facility] OR;  Service: General;  Laterality: Bilateral;  Bilateral posterior rectus sheath and right sided transversus abdominis myofascial advancement flaps   ADENOIDECTOMY  02/22/1982   CESAREAN SECTION  04/07/2005   CESAREAN SECTION  09/24/2011   cyst N/A 12/15/2020   2 scalp cysts removed, benign   CYSTOSCOPY W/ RETROGRADES  01/22/2021   Procedure: CYSTOSCOPY WITH RETROGRADE PYELOGRAM;FISTULOGRAM;  Surgeon: Belva Agee, MD;  Location: [facility];  Service: Urology;;   DILATATION & CURETTAGE/HYSTEROSCOPY WITH MYOSURE N/A 01/20/2021   Procedure: DILATATION & CURETTAGE/HYSTEROSCOPY WITH MYOSURE;  Surgeon: Carver Fila, MD;  Location: [facility];  Service: Gynecology;  Laterality: N/A;   DILATION AND CURETTAGE, DIAGNOSTIC / THERAPEUTIC  12/23/2012   FLEXIBLE SIGMOIDOSCOPY N/A 01/22/2021   Procedure: DIAGNOSTIC FLEXIBLE SIGMOIDOSCOPY;  Surgeon: Andria Meuse, MD;  Location: [facility];  Service: General;  Laterality: N/A;   INCISIONAL HERNIA REPAIR N/A 01/05/2022  Procedure: OPEN INCISIONAL HERNIA REPAIR WITH BARD MESH;  Surgeon: Quentin Ore, MD;  Location: [facility];  Service: General;  Laterality: N/A;   INSERTION OF MESH  01/05/2022   Procedure: INSERTION OF MESH;  Surgeon: Quentin Ore, MD;  Location: [facility];  Service: General;;   LAPAROTOMY N/A 01/22/2021   Procedure: EXPLORATORY  LAPAROTOMY;  Surgeon: Carver Fila, MD;  Location: [facility];  Service: Gynecology;  Laterality: N/A;   LAPAROTOMY N/A 01/22/2021   Procedure: EXPLORATORY LAPAROTOMY;  Surgeon: Andria Meuse, MD;  Location: [facility];  Service: General;  Laterality: N/A;   LAPAROTOMY N/A 08/24/2022   Procedure: EXPLORATORY LAPAROTOMY, SMALL BOWEL RESECTION AND REMOVAL OF APPENDIX;  Surgeon: Manus Rudd, MD;  Location: [facility];  Service: General;  Laterality: N/A;   LAPAROTOMY N/A 08/26/2022   Procedure: EXPLORATORY LAPAROTOMY, WASHOUT, SMALL BOWEL ANASTOMOSIS, HARTMANN'S RESECTION, TAKE DOWN SPLENIC FLEXURE;  Surgeon: Andria Meuse, MD;  Location: [facility];  Service: General;  Laterality: N/A;   MYRINGOTOMY WITH TUBE PLACEMENT     TUBAL LIGATION Bilateral 12/23/2012   WISDOM TOOTH EXTRACTION  02/22/1993   XI ROBOTIC ASSISTED SALPINGECTOMY Right 01/20/2021   Procedure: XI ROBOTIC ASSISTED RIGHT  SALPINGECTOMY, LEFT OVARIAN BIOPSY;  Surgeon: Carver Fila, MD;  Location: [facility];  Service: Gynecology;  Laterality: Right;    Allergies: Prednisone  Medications: Prior to Admission medications   Medication Sig Start Date End Date Taking? Authorizing Provider  gabapentin (NEURONTIN) 300 MG capsule Take 300 mg by mouth daily as needed (rheumatoid arthritis).   Yes [provider]  ibuprofen (ADVIL) 800 MG tablet Take 800 mg by mouth every 8 (eight) hours as needed for moderate pain (rheumatoid arthritis flares).   Yes [provider]  levothyroxine (SYNTHROID) 150 MCG tablet Take 150 mcg by mouth daily before breakfast.   Yes [provider]  oxyCODONE-acetaminophen (PERCOCET/ROXICET) 5-325 MG tablet Take 1 tablet by mouth every 6 (six) hours as needed for severe pain.   Yes [provider]     Family History  Problem Relation Age of Onset   Diabetes Father    Leukemia Father    Heart disease Maternal Grandmother    Heart disease Maternal Grandfather    Heart  disease Paternal Grandmother    Skin cancer Other    Colon cancer Neg Hx    Breast cancer Neg Hx    Ovarian cancer Neg Hx    Endometrial cancer Neg Hx    Prostate cancer Neg Hx    Pancreatic cancer Neg Hx     Social History   Socioeconomic History   Marital status: Married    Spouse name: Not on file   Number of children: Not on file   Years of education: Not on file   Highest education level: Not on file  Occupational History   Not on file  Tobacco Use   Smoking status: Former    Current packs/day: 0.00    Average packs/day: 0.3 packs/day for 20.0 years (5.0 ttl pk-yrs)    Types: Cigarettes    Start date: 28    Quit date: 2013    Years since quitting: 11.5   Smokeless tobacco: Never  Vaping Use   Vaping status: Never Used  Substance and Sexual Activity   Alcohol use: Yes    Alcohol/week: 6.0 standard drinks of alcohol    Types: 6 Standard drinks or equivalent per week   Drug use: Never   Sexual activity: Yes  Birth control/protection: Surgical    Comment: TL  Other Topics Concern   Not on file  Social History Narrative   Not on file   Social Determinants of Health   Financial Resource Strain: Not on file  Food Insecurity: No Food Insecurity (08/25/2022)   Hunger Vital Sign    Worried About Running Out of Food in the Last Year: Never true    Ran Out of Food in the Last Year: Never true  Transportation Needs: No Transportation Needs (08/25/2022)   PRAPARE - Administrator, Civil Service (Medical): No    Lack of Transportation (Non-Medical): No  Physical Activity: Not on file  Stress: Not on file  Social Connections: Not on file     Review of Systems  Unable to perform ROS: Other  Sleepy due to recent Valium  Vital Signs: BP 106/69 (BP Location: Left Arm)   Pulse 86   Temp 97.7 F (36.5 C) (Oral)   Resp 16   Ht 5\' 3"  (1.6 m)   Wt 214 lb 4.6 oz (97.2 kg)   SpO2 97%   BMI 37.96 kg/m   Advance Care Plan: The advanced care  place/surrogate decision maker was discussed at the time of visit and the patient did not wish to discuss or was not able to name a surrogate decision maker or provide an advance care plan.  Physical Exam Vitals reviewed.  Constitutional:      Appearance: Normal appearance. She is obese.     Comments: Sleepy due to recent valium, arouses to voice.   HENT:     Head: Normocephalic and atraumatic.  Eyes:     Extraocular Movements: Extraocular movements intact.  Neck:     Comments: NGT to LWS Cardiovascular:     Rate and Rhythm: Normal rate and regular rhythm.  Pulmonary:     Effort: Pulmonary effort is normal. No respiratory distress.     Breath sounds: Normal breath sounds.  Abdominal:     Comments: Midline incision with wound vac in place. Surgical drain on the right. Colostomy LLQ.  Musculoskeletal:        General: Normal range of motion.     Cervical back: Normal range of motion.  Skin:    General: Skin is warm and dry.  Neurological:     General: No focal deficit present.     Mental Status: She is oriented to person, place, and time.  Psychiatric:        Mood and Affect: Mood normal.        Behavior: Behavior normal.     Imaging: CLINICAL DATA:  47 year old female with history of postoperative abdominal pain.   EXAM: CT ABDOMEN AND PELVIS WITH CONTRAST   TECHNIQUE: Multidetector CT imaging of the abdomen and pelvis was performed using the standard protocol following bolus administration of intravenous contrast.   RADIATION DOSE REDUCTION: This exam was performed according to the departmental dose-optimization program which includes automated exposure control, adjustment of the mA and/or kV according to patient size and/or use of iterative reconstruction technique.   CONTRAST:  75mL OMNIPAQUE IOHEXOL 350 MG/ML SOLN   COMPARISON:  CT of the abdomen and pelvis 08/24/2022.   FINDINGS: Lower chest: Trace left pleural effusion. Bibasilar areas of subsegmental  atelectasis are noted in the lungs. Central venous catheter tip terminating in the right atrium. Nasogastric tube extending into the stomach.   Hepatobiliary: Liver is enlarged measuring 24.3 cm in craniocaudal span. No suspicious cystic or solid hepatic lesions.  No intra or extrahepatic biliary ductal dilatation. Gallbladder is unremarkable in appearance.   Pancreas: No pancreatic mass. No pancreatic ductal dilatation. No pancreatic or peripancreatic fluid collections or inflammatory changes.   Spleen: Unremarkable.   Adrenals/Urinary Tract: Subcentimeter low-attenuation lesion in the lower pole of the left kidney, too small to definitively characterize, but statistically likely a cyst (no imaging follow-up recommended). 3 mm nonobstructive calculus in the upper pole collecting system of the left kidney. No aggressive appearing renal lesions. No hydroureteronephrosis. Small amount of gas non dependently in the lumen of the urinary bladder. Urinary bladder is otherwise normal in appearance. Bilateral adrenal glands are normal in appearance.   Stomach/Bowel: Nasogastric tube is coiled in the stomach with tip in the antrum. Stomach is otherwise unremarkable in appearance. 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 No pathologic dilatation of small bowel or colon. Status post appendectomy. Numerous colonic diverticula are noted, particularly in the region of the sigmoid colon, without definite focal surrounding inflammatory changes to clearly indicate an acute diverticulitis at this time.   Vascular/Lymphatic: No significant atherosclerotic disease, aneurysm or dissection noted in the abdominal or pelvic vasculature. Numerous prominent borderline enlarged and mildly enlarged abdominal and retroperitoneal lymph nodes are noted, measuring up to  1.2 cm in short axis in the left para-aortic nodal station (axial image 49 of series 3), likely reactive.   Reproductive: Uterus and ovaries are unremarkable in appearance.   Other: Healing midline abdominal wound. Surgical drain extending into the right-side of the low anatomic pelvis. Small volume of pneumoperitoneum. Widespread peritoneal enhancement with numerous focal rim enhancing collections of low-attenuation fluid scattered throughout the peritoneal cavity, compatible with peritonitis and multiple intra-abdominal abscesses. The largest of these is in the left pericolic gutter (axial image 54 of series 3 and coronal image 56 of series 6) measuring up to 9.9 x 4.8 x 5.4 cm. Another prominent collection is located immediately deep to the surgical wound in the midline (axial image 62 of series 3 and sagittal image 69 of series 7) measuring approximately 5.0 x 1.5 x 6.6 cm.   Musculoskeletal: There are no aggressive appearing lytic or blastic lesions noted in the visualized portions of the skeleton.   IMPRESSION: 1. Postoperative changes of recent partial colectomy with left lower quadrant colostomy and partial small bowel resection, with evidence of peritonitis and multiple intra-abdominal abscesses, as detailed above. 2. Multiple borderline enlarged and mildly enlarged abdominal and retroperitoneal lymph nodes, presumably reactive. 3. Hepatomegaly. 4. Trace left pleural effusion. 5. Dependent areas of subsegmental atelectasis in the lower lobes of the lungs bilaterally. 6. Additional incidental findings, as above.   These results will be called to the ordering clinician or representative by the Radiologist Assistant, and communication documented in the PACS or [employer].     Electronically Signed   By: Trudie Reed M.D.   On: 09/03/2022 08:45  Labs:  CBC: Recent Labs    08/30/22 0428 09/01/22 1549 09/02/22 1105 09/03/22 0227  WBC 15.5* 16.8* 19.3*  16.6*  HGB 8.9* 7.6* 8.3* 7.6*  HCT 27.0* 25.9* 26.4* 24.3*  PLT 424* 640* 842* 756*    COAGS: No results for input(s): "INR", "APTT" in the last 8760 hours.  BMP: Recent Labs    08/31/22 0304 09/01/22 0425 09/02/22 0321 09/03/22 0227  NA 133* 132* 132* 133*  K 4.4 4.0 4.5 4.0  CL 94* 101 102 102  CO2 25 23 23 25   GLUCOSE 159* 171* 181* 136*  BUN 10 13 12 11   CALCIUM 8.0* 7.8* 7.9* 7.9*  CREATININE 0.66  0.64 0.41* 0.66 0.56  GFRNONAA >60  >60 >60 >60 >60    LIVER FUNCTION TESTS: Recent Labs    08/25/22 0416 08/27/22 0504 08/30/22 0428 09/02/22 0321  BILITOT 2.1* 1.7* 2.1* 2.0*  AST 25 25 25 17   ALT 20 20 30 24   ALKPHOS 46 43 90 109  PROT 5.1* 4.6* 5.3* 6.5  ALBUMIN 2.0* 1.8* 1.5* 1.7*    TUMOR MARKERS: No results for input(s): "AFPTM", "CEA", "CA199", "CHROMGRNA" in the last 8760 hours.  Assessment and Plan:  Postoperative changes of recent partial colectomy with left lower quadrant colostomy and partial small bowel resection, with evidence of peritonitis and multiple intra-abdominal abscesses  Will proceed with image guided drain placement today by Dr. Lowella Dandy.  Risks and benefits discussed with the patient including bleeding, infection, damage to adjacent structures, bowel perforation/fistula connection, and sepsis.  All of the patient's questions were answered, patient is agreeable to proceed. Consent signed and in chart.   Thank you for allowing our service to participate in Brittney Herrera 's care.  Electronically Signed: Gwynneth Macleod, PA-C   09/03/2022, 11:34 AM      I spent a total of 40 Minutes  in face to face in clinical consultation, greater than 50% of which was counseling/coordinating care for drain placement.

## 2022-09-03 NOTE — Consult Note (Signed)
WOC Nurse wound follow up; this visit made with Bailey Mech, PA CCS  Wound type: midline surgical  Measurement: see 7/10 note  Wound bed: 90% beefy red, 10% yellow adipose  Drainage (amount, consistency, odor) moderate serosanguinous  Periwound: intact, ostomy to left  Dressing procedure/placement/frequency: Removed old NPWT dressing Cleansed wound with normal saline; cut a 2" barrier ring in half , flattened out and placed a small piece at umbilicus and rest at most distal portion of wound to assist with seal  Filled wound with 2 pieces black foam.  Sealed NPWT dressing at HG Patient received IV  pain medication per bedside nurse prior to dressing change Patient tolerated procedure fair, soaked entire foam dressing with NS prior to attempting to remove. Patient stated this did help.   Cannister containing approximately 350 mls serosanguinous effluent changed at this visit.    WOC nurse will continue to provide NPWT dressing changes due to the complexity of the dressing change. Next dressing change Monday 09/06/2022.  Ordered 2 large NPWT dressing kits for room as well as  2" barrier ring.   Ostomy pouch placed Wednesday 7/10 intact. Patient stated bedside staff had been emptying. I encouraged patient to begin emptying her own pouch. Patient says she can not see her ostomy to change pouch.  Husband not present at this visit.  Since pouch intact and husband not present will defer any ostomy teaching til Monday 7/15. I did encourage patient that she and husband will be responsible for caring for ostomy at home and we are here to assist them in becoming independent with care. Patient verbalized understanding.    WOC team will continue to follow for NPWT changes M-W-F as well as ostomy education and support.   Thank you,    Priscella Mann MSN, RN-BC, [employer] [phone]

## 2022-09-03 NOTE — Progress Notes (Addendum)
8 Days Post-Op   Subjective/Chief Complaint:  Overall doing better w/ adjustment in pain meds. NG remains high output, minimal ostomy function.   Objective: Vital signs in last 24 hours: Temp:  [97.7 F (36.5 C)-98 F (36.7 C)] 97.7 F (36.5 C) (07/12 0919) Pulse Rate:  [80-87] 86 (07/12 0919) Resp:  [16-17] 16 (07/12 0919) BP: (106-151)/(69-95) 106/69 (07/12 0919) SpO2:  [97 %-100 %] 97 % (07/12 0919) Weight:  [97.2 kg] 97.2 kg (07/12 0500) Last BM Date : 09/02/22  Intake/Output from previous day: 07/11 0701 - 07/12 0700 In: 1882.6 [I.V.:994.2; IV Piggyback:888.4] Out: 2190 [Urine:1000; Emesis/NG output:1050; Drains:40; Stool:100] Intake/Output this shift: No intake/output data recorded.  Exam: Awake and alert, NAD HEENT: NG in place with bilious drainage Abdomen soft, stomy pink with scant stool in bag. JP with purulent drainage.   Lab Results:  Recent Labs    09/02/22 1105 09/03/22 0227  WBC 19.3* 16.6*  HGB 8.3* 7.6*  HCT 26.4* 24.3*  PLT 842* 756*   BMET Recent Labs    09/02/22 0321 09/03/22 0227  NA 132* 133*  K 4.5 4.0  CL 102 102  CO2 23 25  GLUCOSE 181* 136*  BUN 12 11  CREATININE 0.66 0.56  CALCIUM 7.9* 7.9*   PT/INR No results for input(s): "LABPROT", "INR" in the last 72 hours. ABG No results for input(s): "PHART", "HCO3" in the last 72 hours.  Invalid input(s): "PCO2", "PO2"  Studies/Results: CT ABDOMEN PELVIS W CONTRAST  Result Date: 09/03/2022 CLINICAL DATA:  47 year old female with history of postoperative abdominal pain. EXAM: CT ABDOMEN AND PELVIS WITH CONTRAST TECHNIQUE: Multidetector CT imaging of the abdomen and pelvis was performed using the standard protocol following bolus administration of intravenous contrast. RADIATION DOSE REDUCTION: This exam was performed according to the departmental dose-optimization program which includes automated exposure control, adjustment of the mA and/or kV according to patient size and/or use  of iterative reconstruction technique. CONTRAST:  75mL OMNIPAQUE IOHEXOL 350 MG/ML SOLN COMPARISON:  CT of the abdomen and pelvis 08/24/2022. FINDINGS: Lower chest: Trace left pleural effusion. Bibasilar areas of subsegmental atelectasis are noted in the lungs. Central venous catheter tip terminating in the right atrium. Nasogastric tube extending into the stomach. Hepatobiliary: Liver is enlarged measuring 24.3 cm in craniocaudal span. No suspicious cystic or solid hepatic lesions. No intra or extrahepatic biliary ductal dilatation. Gallbladder is unremarkable in appearance. Pancreas: No pancreatic mass. No pancreatic ductal dilatation. No pancreatic or peripancreatic fluid collections or inflammatory changes. Spleen: Unremarkable. Adrenals/Urinary Tract: Subcentimeter low-attenuation lesion in the lower pole of the left kidney, too small to definitively characterize, but statistically likely a cyst (no imaging follow-up recommended). 3 mm nonobstructive calculus in the upper pole collecting system of the left kidney. No aggressive appearing renal lesions. No hydroureteronephrosis. Small amount of gas non dependently in the lumen of the urinary bladder. Urinary bladder is otherwise normal in appearance. Bilateral adrenal glands are normal in appearance. Stomach/Bowel: Nasogastric tube is coiled in the stomach with tip in the antrum. Stomach is otherwise unremarkable in appearance. 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 No pathologic dilatation of small bowel or colon. Status post appendectomy. Numerous colonic diverticula are noted, particularly in the region of the sigmoid colon, without definite focal surrounding inflammatory changes to clearly indicate an acute diverticulitis at this time. Vascular/Lymphatic: No significant atherosclerotic disease, aneurysm or dissection  noted in the abdominal  or pelvic vasculature. Numerous prominent borderline enlarged and mildly enlarged abdominal and retroperitoneal lymph nodes are noted, measuring up to 1.2 cm in short axis in the left para-aortic nodal station (axial image 49 of series 3), likely reactive. Reproductive: Uterus and ovaries are unremarkable in appearance. Other: Healing midline abdominal wound. Surgical drain extending into the right-side of the low anatomic pelvis. Small volume of pneumoperitoneum. Widespread peritoneal enhancement with numerous focal rim enhancing collections of low-attenuation fluid scattered throughout the peritoneal cavity, compatible with peritonitis and multiple intra-abdominal abscesses. The largest of these is in the left pericolic gutter (axial image 54 of series 3 and coronal image 56 of series 6) measuring up to 9.9 x 4.8 x 5.4 cm. Another prominent collection is located immediately deep to the surgical wound in the midline (axial image 62 of series 3 and sagittal image 69 of series 7) measuring approximately 5.0 x 1.5 x 6.6 cm. Musculoskeletal: There are no aggressive appearing lytic or blastic lesions noted in the visualized portions of the skeleton. IMPRESSION: 1. Postoperative changes of recent partial colectomy with left lower quadrant colostomy and partial small bowel resection, with evidence of peritonitis and multiple intra-abdominal abscesses, as detailed above. 2. Multiple borderline enlarged and mildly enlarged abdominal and retroperitoneal lymph nodes, presumably reactive. 3. Hepatomegaly. 4. Trace left pleural effusion. 5. Dependent areas of subsegmental atelectasis in the lower lobes of the lungs bilaterally. 6. Additional incidental findings, as above. These results will be called to the ordering clinician or representative by the Radiologist Assistant, and communication documented in the PACS or [employer]. Electronically Signed   By: Trudie Reed M.D.   On: 09/03/2022 08:45     Anti-infectives: Anti-infectives (From admission, onward)    Start     Dose/Rate Route Frequency Ordered Stop   09/01/22 1400  piperacillin-tazobactam (ZOSYN) IVPB 3.375 g        3.375 g 12.5 mL/hr over 240 Minutes Intravenous Every 8 hours 09/01/22 1308 09/08/22 1359   08/24/22 2200  piperacillin-tazobactam (ZOSYN) IVPB 3.375 g        3.375 g 12.5 mL/hr over 240 Minutes Intravenous Every 8 hours 08/24/22 1635 09/01/22 0826   08/24/22 1430  piperacillin-tazobactam (ZOSYN) IVPB 3.375 g        3.375 g 100 mL/hr over 30 Minutes Intravenous  Once 08/24/22 1423 08/24/22 1608       Assessment/Plan: Pneumoperitoneum, retroperitoneal air, mediastinal air likely secondary to perforated rectosigmoid colon with history of pelvic mass  7/2: ex lap extensive LOA, SBR in discontinuity, placement of pelvic drain, placement of ABThera vacuum dressing, Dr. Corliss Skains   7/4: ex lap, hartmann's procedure,  washout, small bowel anastomosis (re-establish continuity), drainage LUQ IAA, abdominal closure, Dr. Cliffton Asters POD#10/#7  - WBC 16 - CT w/ multiple fluid collectons, IR consult for drainage  - Continue NG to LIS, ostomy minimally productive with high NG output. - Continue TPN - VAC change M/W/F by RN staff.  - Multimodal pain control: continue robaxin, IV tylenol, prn dilaudid; toradol D/C-ed because patient refusing, says it causes significant agitation/burning discomfort all over. - VTE: lovenox held until IR eval, SCDs - Dispo: inpatient, med-surg floor   Adam Phenix 09/03/2022

## 2022-09-03 NOTE — Plan of Care (Signed)
  Problem: Education: Goal: Knowledge of General Education information will improve Description: Including pain rating scale, medication(s)/side effects and non-pharmacologic comfort measures Outcome: Progressing   Problem: Pain Managment: Goal: General experience of comfort will improve Outcome: Progressing   Problem: Skin Integrity: Goal: Risk for impaired skin integrity will decrease Outcome: Progressing   

## 2022-09-03 NOTE — Procedures (Signed)
Interventional Radiology Procedure:   Indications: Post operative intra-abdominal fluid collections  Procedure: CT guided placement of drain in left abdominal fluid collection and CT guided aspiration of anterior abdominal collection.  Findings: 10 Fr drain placed in left lateral abdominal collection and removed yellow serous fluid.  Could only aspirate a 1-2 ml of bloody fluid from anterior abdominal collection and did not place drain in this small collection.  Fluids sent for culture.   Complications: None     EBL: Minimal  Plan: Fluid sent for culture.  Follow output from left abdominal drain.  Eeshan Verbrugge R. Lowella Dandy, MD  Pager: [phone]

## 2022-09-03 NOTE — Progress Notes (Signed)
PT Cancellation Note  Patient Details Name: Brittney Herrera MRN: [id number] DOB: 18-Apr-1975   Cancelled Treatment:    Reason Eval/Treat Not Completed: Patient at procedure or test/unavailable (Pt off the floor at CT. Per RN pt has already been ambulating in hallway. Will follow up on Monday if still admitted.)   Gladys Damme 09/03/2022, 3:06 PM

## 2022-09-03 NOTE — Progress Notes (Signed)
PHARMACY - TOTAL PARENTERAL NUTRITION CONSULT NOTE   Indication: Prolonged NPO status   Patient Measurements: Height: 5\' 3"  (160 cm) Weight: 97.2 kg (214 lb 4.6 oz) IBW/kg (Calculated) : 52.4 TPN AdjBW (KG): 60.6 Body mass index is 37.96 kg/m. Usual Weight: 85 kg  Assessment:   47 y.o. F with history of a known pelvic mass that is follow by Dr. Pricilla Holm with [facility].  She underwent attempted resection in 2022 but given this was so adhered to multiple surrounding structures, it was unable to be removed. Biopsies were obtained at this time and this was not found to be malignant. Her last MRI revealed this was smaller in size. She reports being in her normal state of health until Saturday when she developed an acute onset of pelvic cramping and pain. Admitted for septic shock, started on Zosyn. Leukocytosis now resolved, improved hemodynamics. Renal function improving. Anticipated prolonged NPO status per Surgery. Pharmacy consulted to initiate TPN.   Glucose / Insulin: no hx DM, CBGs <180, off SSI Electrolytes: Na 133, K 4.0, Mg 2.1, CoCa 9.7, others wnl Renal: Scr 0.66 (bsl <1), BUN wnl Hepatic: LFTs wnl, tbili 2, Alb 1.7, Hx hypertriglyceridemia TG 256  Intake / Output; MIVF: UOP 0.4 ml/kg/hr, NGT , drains 40 mL, colostomy , Net + 7.4L  GI Imaging: 7/2 CT abd: Pneumomediastinum extending into the lower neck. There is pneumoperitoneum in the upper abdomen suggesting possible bowel perforation. Fatty liver. GI Surgeries / Procedures:  7/2 OR for ex-lap, area of small bowel resection - left in discontinuity with open abdomen 7/4  OR for washout, hartmann's procedure, SB re-anastomosis, abdominal closure   Central access: 7/3 PICC  TPN start date: 7/4    Nutritional Goals: Goal TPN rate without lipids is 90 mL/hr (provides 125g protein and 1897 kcals)  RD Assessment: Estimated Needs Total Energy Estimated Needs: 1900-2100 Total Protein Estimated Needs: 105-125g Total Fluid  Estimated Needs: >/=1.9L  Current Nutrition:  NPO w/ sips/ ice chips + 4oz juice BID to sip + TPN  Plan:  Continue TPN at goal 85 mL/hr at 1800, provides 124g protein and 1911 kcals, meeting 100% estimated needs Electrolytes in TPN: Na 148 mEq/L (can't do 150 and Cl:Ac 1:1), K 25 mEq/L, Ca 5 mEq/L, Mg 14 mEq/L, Phos 14 mmol/L. Cl:Ac 1:1 Continue standard MVI and trace elements to TPN Monitor TPN labs on Mon/Thurs, daily as needed  F/u triglycerides and increase lipids in TPN as able   Thank you for allowing pharmacy to be a part of this patient's care.  Calton Dach, PharmD Clinical Pharmacist 09/03/2022 7:55 AM

## 2022-09-04 LAB — CBC
HCT: 24.1 % — ABNORMAL LOW (ref 36.0–46.0)
Hemoglobin: 7.6 g/dL — ABNORMAL LOW (ref 12.0–15.0)
MCH: 32.2 pg (ref 26.0–34.0)
MCHC: 31.5 g/dL (ref 30.0–36.0)
MCV: 102.1 fL — ABNORMAL HIGH (ref 80.0–100.0)
Platelets: 836 10*3/uL — ABNORMAL HIGH (ref 150–400)
RBC: 2.36 MIL/uL — ABNORMAL LOW (ref 3.87–5.11)
RDW: 17.6 % — ABNORMAL HIGH (ref 11.5–15.5)
WBC: 16.7 10*3/uL — ABNORMAL HIGH (ref 4.0–10.5)
nRBC: 0 % (ref 0.0–0.2)

## 2022-09-04 LAB — BASIC METABOLIC PANEL
Anion gap: 7 (ref 5–15)
BUN: 12 mg/dL (ref 6–20)
CO2: 24 mmol/L (ref 22–32)
Calcium: 7.8 mg/dL — ABNORMAL LOW (ref 8.9–10.3)
Chloride: 100 mmol/L (ref 98–111)
Creatinine, Ser: 0.67 mg/dL (ref 0.44–1.00)
GFR, Estimated: >60 mL/min (ref >60)
Glucose, Bld: 124 mg/dL — ABNORMAL HIGH (ref 70–99)
Potassium: 3.9 mmol/L (ref 3.5–5.1)
Sodium: 131 mmol/L — ABNORMAL LOW (ref 135–145)

## 2022-09-04 MED ORDER — TRAVASOL 10 % IV SOLN
INTRAVENOUS | Status: AC
  Filled 2022-09-04: qty 1244.4

## 2022-09-04 NOTE — Plan of Care (Signed)
  Problem: Education: Goal: Knowledge of General Education information will improve Description: Including pain rating scale, medication(s)/side effects and non-pharmacologic comfort measures Outcome: Progressing   Problem: Health Behavior/Discharge Planning: Goal: Ability to manage health-related needs will improve Outcome: Progressing   Problem: Clinical Measurements: Goal: Ability to maintain clinical measurements within normal limits will improve Outcome: Progressing Goal: Will remain free from infection Outcome: Progressing Goal: Diagnostic test results will improve Outcome: Progressing Goal: Respiratory complications will improve Outcome: Progressing Goal: Cardiovascular complication will be avoided Outcome: Progressing   Problem: Activity: Goal: Risk for activity intolerance will decrease Outcome: Progressing   Problem: Nutrition: Goal: Adequate nutrition will be maintained Outcome: Progressing   Problem: Coping: Goal: Level of anxiety will decrease Outcome: Progressing   Problem: Elimination: Goal: Will not experience complications related to bowel motility Outcome: Progressing Goal: Will not experience complications related to urinary retention Outcome: Progressing   Problem: Pain Managment: Goal: General experience of comfort will improve Outcome: Progressing   Problem: Safety: Goal: Ability to remain free from injury will improve Outcome: Progressing   Problem: Skin Integrity: Goal: Risk for impaired skin integrity will decrease Outcome: Progressing   Problem: Education: Goal: Knowledge of ostomy care will improve Outcome: Progressing Goal: Understanding of discharge needs will improve Outcome: Progressing   Problem: Bowel/Gastric/Urinary: Goal: Gastrointestinal status for postoperative course will improve Outcome: Progressing   Problem: Coping: Goal: Coping ability will improve Outcome: Progressing   Problem: Fluid Volume: Goal: Ability to  achieve a balanced intake and output will improve Outcome: Progressing   Problem: Health Behavior/Discharge Planning: Goal: Ability to manage health-related needs will improve Outcome: Progressing   Problem: Nutrition: Goal: Will attain and maintain optimal nutritional status will improve Outcome: Progressing   Problem: Clinical Measurements: Goal: Postoperative complications will be avoided or minimized Outcome: Progressing   Problem: Skin Integrity: Goal: Will show signs of wound healing Outcome: Progressing Goal: Risk for impaired skin integrity will decrease Outcome: Progressing   

## 2022-09-04 NOTE — Progress Notes (Addendum)
Assessment & Plan: HD#12 Pneumoperitoneum, retroperitoneal air, mediastinal air likely secondary to perforated rectosigmoid colon with history of pelvic mass  7/2: ex lap extensive LOA, SBR in discontinuity, placement of pelvic drain, placement of ABThera vacuum dressing - Dr. Corliss Skains   7/4: ex lap, hartmann's procedure,  washout, small bowel anastomosis (re-establish continuity), drainage LUQ IAA, abdominal closure - Dr. Cliffton Asters   - WBC 16.7, stable - CT w/ multiple fluid collectons, IR drainage performed 7/12 on left - Continue NG to LIS, ostomy minimally productive with high NG output - Continue TPN - VAC change M/W/F by RN staff.  - Multimodal pain control: continue robaxin, IV tylenol, prn dilaudid  - VTE: lovenox, SCDs - Dispo: inpatient, med-surg floor        [name], MD [facility] A [facility] practice Office: [phone]        Chief Complaint: Probable perforated viscus  Subjective: Patient in bed, alert, wants NG tube removed.  Objective: Vital signs in last 24 hours: Temp:  [97.6 F (36.4 C)-98.2 F (36.8 C)] 98.2 F (36.8 C) (07/13 0752) Pulse Rate:  [86-94] 90 (07/13 0752) Resp:  [17-24] 19 (07/13 0752) BP: (119-146)/(74-97) 126/87 (07/13 0752) SpO2:  [95 %-100 %] 97 % (07/13 0752) Weight:  [97.3 kg] 97.3 kg (07/13 0500) Last BM Date : 09/03/22  Intake/Output from previous day: 07/12 0701 - 07/13 0700 In: 2275.1 [I.V.:1685.2; IV Piggyback:589.9] Out: 2618 [Urine:1400; Emesis/NG output:1100; Drains:118] Intake/Output this shift: No intake/output data recorded.  Physical Exam: HEENT - sclerae clear, mucous membranes moist Abdomen - soft, obese; VAC dressing to midline wound intact; right drain with thin brown output, left drain serous Neuro - alert & oriented, no focal deficits  Lab Results:  Recent Labs    09/03/22 0227 09/04/22 0300  WBC 16.6* 16.7*  HGB 7.6* 7.6*  HCT 24.3* 24.1*  PLT 756* 836*   BMET Recent Labs     09/03/22 0227 09/04/22 0300  NA 133* 131*  K 4.0 3.9  CL 102 100  CO2 25 24  GLUCOSE 136* 124*  BUN 11 12  CREATININE 0.56 0.67  CALCIUM 7.9* 7.8*   PT/INR Recent Labs    09/03/22 1257  LABPROT 15.0  INR 1.2   Comprehensive Metabolic Panel:    Component Value Date/Time   NA 131 (L) 09/04/2022 0300   NA 133 (L) 09/03/2022 0227   K 3.9 09/04/2022 0300   K 4.0 09/03/2022 0227   CL 100 09/04/2022 0300   CL 102 09/03/2022 0227   CO2 24 09/04/2022 0300   CO2 25 09/03/2022 0227   BUN 12 09/04/2022 0300   BUN 11 09/03/2022 0227   CREATININE 0.67 09/04/2022 0300   CREATININE 0.56 09/03/2022 0227   GLUCOSE 124 (H) 09/04/2022 0300   GLUCOSE 136 (H) 09/03/2022 0227   CALCIUM 7.8 (L) 09/04/2022 0300   CALCIUM 7.9 (L) 09/03/2022 0227   AST 17 09/02/2022 0321   AST 25 08/30/2022 0428   ALT 24 09/02/2022 0321   ALT 30 08/30/2022 0428   ALKPHOS 109 09/02/2022 0321   ALKPHOS 90 08/30/2022 0428   BILITOT 2.0 (H) 09/02/2022 0321   BILITOT 2.1 (H) 08/30/2022 0428   PROT 6.5 09/02/2022 0321   PROT 5.3 (L) 08/30/2022 0428   ALBUMIN 1.7 (L) 09/02/2022 0321   ALBUMIN 1.5 (L) 08/30/2022 0428    Studies/Results: CT GUIDED VISCERAL FLUID DRAIN BY PERC CATH  Result Date: 09/04/2022 INDICATION: 47 year old with postoperative abdominal fluid collections. EXAM: 1. CT-guided  placement of drain in left abdominal fluid collection 2. CT-guided aspiration of anterior abdominal fluid collection MEDICATIONS: Moderate sedation ANESTHESIA/SEDATION: Moderate (conscious) sedation was employed during this procedure. A total of Versed 2 mg and fentanyl 200 mcg was administered intravenously at the order of the provider performing the procedure. Total intra-service moderate sedation time: 38 minutes. Patient's level of consciousness and vital signs were monitored continuously by radiology nurse throughout the procedure under the supervision of the provider performing the procedure. COMPLICATIONS: None  immediate. PROCEDURE: Informed written consent was obtained from the patient after a thorough discussion of the procedural risks, benefits and alternatives. All questions were addressed. Maximal Sterile Barrier Technique was utilized including caps, mask, sterile gowns, sterile gloves, sterile drape, hand hygiene and skin antiseptic. A timeout was performed prior to the initiation of the procedure. Patient was placed supine on the CT scanner. CT images of the abdomen were obtained. The left side of the abdomen was prepped with chlorhexidine and sterile field was created. Skin was anesthetized using 1% lidocaine. Using CT guidance, an 18 gauge trocar needle was directed into the left abdominal fluid collection and yellow serous-looking fluid was aspirated. Superstiff Amplatz wire was advanced into the collection and the tract was dilated to accommodate a 10 [place] multipurpose drain. Additional yellow fluid was aspirated. Drain was attached to a suction bulb. Drain was sutured to skin. Attention was directed to the small anterior abdominal fluid collection. The right anterior abdomen was prepped with chlorhexidine. Sterile field was created. Skin was anesthetized using 1% lidocaine. Using CT guidance, 18 gauge trocar needle was directed into the anterior abdominal fluid collection. Initially, no fluid could be aspirated. Eventually a small amount of thick bloody fluid was aspirated. This collection did not appear to be amendable for drain placement. Needle was removed. RADIATION DOSE REDUCTION: This exam was performed according to the departmental dose-optimization program which includes automated exposure control, adjustment of the mA and/or kV according to patient size and/or use of iterative reconstruction technique. FINDINGS: 22 French drain was placed within the small left lateral abdominal fluid collection. Yellow fluid was aspirated from this collection. Small amount of thick bloody fluid was aspirated from  the anterior abdominal fluid collection. IMPRESSION: 1. CT-guided placement of a drain in the left lateral abdominal fluid collection. 2. CT-guided aspiration of a small amount of bloody fluid from the anterior abdominal collection. Electronically Signed   By: Richarda Overlie M.D.   On: 09/04/2022 08:09   CT ABDOMEN PELVIS W CONTRAST  Result Date: 09/03/2022 CLINICAL DATA:  47 year old female with history of postoperative abdominal pain. EXAM: CT ABDOMEN AND PELVIS WITH CONTRAST TECHNIQUE: Multidetector CT imaging of the abdomen and pelvis was performed using the standard protocol following bolus administration of intravenous contrast. RADIATION DOSE REDUCTION: This exam was performed according to the departmental dose-optimization program which includes automated exposure control, adjustment of the mA and/or kV according to patient size and/or use of iterative reconstruction technique. CONTRAST:  75mL OMNIPAQUE IOHEXOL 350 MG/ML SOLN COMPARISON:  CT of the abdomen and pelvis 08/24/2022. FINDINGS: Lower chest: Trace left pleural effusion. Bibasilar areas of subsegmental atelectasis are noted in the lungs. Central venous catheter tip terminating in the right atrium. Nasogastric tube extending into the stomach. Hepatobiliary: Liver is enlarged measuring 24.3 cm in craniocaudal span. No suspicious cystic or solid hepatic lesions. No intra or extrahepatic biliary ductal dilatation. Gallbladder is unremarkable in appearance. Pancreas: No pancreatic mass. No pancreatic ductal dilatation. No pancreatic or peripancreatic fluid collections or inflammatory  changes. Spleen: Unremarkable. Adrenals/Urinary Tract: Subcentimeter low-attenuation lesion in the lower pole of the left kidney, too small to definitively characterize, but statistically likely a cyst (no imaging follow-up recommended). 3 mm nonobstructive calculus in the upper pole collecting system of the left kidney. No aggressive appearing renal lesions. No  hydroureteronephrosis. Small amount of gas non dependently in the lumen of the urinary bladder. Urinary bladder is otherwise normal in appearance. Bilateral adrenal glands are normal in appearance. Stomach/Bowel: Nasogastric tube is coiled in the stomach with tip in the antrum. Stomach is otherwise unremarkable in appearance. 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 No pathologic dilatation of small bowel or colon. Status post appendectomy. Numerous colonic diverticula are noted, particularly in the region of the sigmoid colon, without definite focal surrounding inflammatory changes to clearly indicate an acute diverticulitis at this time. Vascular/Lymphatic: No significant atherosclerotic disease, aneurysm or dissection noted in the abdominal or pelvic vasculature. Numerous prominent borderline enlarged and mildly enlarged abdominal and retroperitoneal lymph nodes are noted, measuring up to 1.2 cm in short axis in the left para-aortic nodal station (axial image 49 of series 3), likely reactive. Reproductive: Uterus and ovaries are unremarkable in appearance. Other: Healing midline abdominal wound. Surgical drain extending into the right-side of the low anatomic pelvis. Small volume of pneumoperitoneum. Widespread peritoneal enhancement with numerous focal rim enhancing collections of low-attenuation fluid scattered throughout the peritoneal cavity, compatible with peritonitis and multiple intra-abdominal abscesses. The largest of these is in the left pericolic gutter (axial image 54 of series 3 and coronal image 56 of series 6) measuring up to 9.9 x 4.8 x 5.4 cm. Another prominent collection is located immediately deep to the surgical wound in the midline (axial image 62 of series 3 and sagittal image 69 of series 7) measuring approximately 5.0 x 1.5 x 6.6 cm. Musculoskeletal:  There are no aggressive appearing lytic or blastic lesions noted in the visualized portions of the skeleton. IMPRESSION: 1. Postoperative changes of recent partial colectomy with left lower quadrant colostomy and partial small bowel resection, with evidence of peritonitis and multiple intra-abdominal abscesses, as detailed above. 2. Multiple borderline enlarged and mildly enlarged abdominal and retroperitoneal lymph nodes, presumably reactive. 3. Hepatomegaly. 4. Trace left pleural effusion. 5. Dependent areas of subsegmental atelectasis in the lower lobes of the lungs bilaterally. 6. Additional incidental findings, as above. These results will be called to the ordering clinician or representative by the Radiologist Assistant, and communication documented in the PACS or [employer]. Electronically Signed   By: Trudie Reed M.D.   On: 09/03/2022 08:45      [name] 09/04/2022  Patient ID: Willaim Rayas, female   DOB: [birth year], 47 y.o.   MRN: [id number]

## 2022-09-04 NOTE — Progress Notes (Signed)
Patient walking the hallway. Some pain in LUQ where her Jp drain was placed. Family assisting with CNA.

## 2022-09-04 NOTE — Progress Notes (Signed)
Referring Physician(s): Dr. Corliss Skains, Dr. Cliffton Asters.   Supervising Physician: Irish Lack  Patient Status:  [facility] - In-pt  Chief Complaint: Recent partial colectomy with LLQ colostomy, partial small bowel resection. Multiple post-op intra-abdominal abscesses s/p left lateral abdominal drain placement in IR 09/03/22  Subjective: Patient awake and resting in bed. She is complaining of pain/discomfort under her left breast - states pain medication helps.   Allergies: Prednisone and Tramadol  Medications: Prior to Admission medications   Medication Sig Start Date End Date Taking? Authorizing Provider  gabapentin (NEURONTIN) 300 MG capsule Take 300 mg by mouth daily as needed (rheumatoid arthritis).   Yes [provider]  ibuprofen (ADVIL) 800 MG tablet Take 800 mg by mouth every 8 (eight) hours as needed for moderate pain (rheumatoid arthritis flares).   Yes [provider]  levothyroxine (SYNTHROID) 150 MCG tablet Take 150 mcg by mouth daily before breakfast.   Yes [provider]  oxyCODONE-acetaminophen (PERCOCET/ROXICET) 5-325 MG tablet Take 1 tablet by mouth every 6 (six) hours as needed for severe pain.   Yes [provider]     Vital Signs: BP 126/87 (BP Location: Left Arm)   Pulse 90   Temp 98.2 F (36.8 C) (Oral)   Resp 19   Ht 5\' 3"  (1.6 m)   Wt 214 lb 8.1 oz (97.3 kg)   SpO2 97%   BMI 38.00 kg/m   Physical Exam Constitutional:      General: She is not in acute distress.    Appearance: She is not ill-appearing.  HENT:     Nose:     Comments: NG tube Pulmonary:     Effort: Pulmonary effort is normal.  Abdominal:     Comments: LUQ drain to suction. Easily flushed. Approximately 25 ml of clear, light brown fluid in bulb. Dressing clean and dry.   Open midline wound with wound vac in place. Colostomy. Right abdominal surgical drain to suction.   Skin:    General: Skin is warm and dry.  Neurological:     Mental Status: She is  alert and oriented to person, place, and time.     Imaging: CT GUIDED VISCERAL FLUID DRAIN BY PERC CATH  Result Date: 09/04/2022 INDICATION: 47 year old with postoperative abdominal fluid collections. EXAM: 1. CT-guided placement of drain in left abdominal fluid collection 2. CT-guided aspiration of anterior abdominal fluid collection MEDICATIONS: Moderate sedation ANESTHESIA/SEDATION: Moderate (conscious) sedation was employed during this procedure. A total of Versed 2 mg and fentanyl 200 mcg was administered intravenously at the order of the provider performing the procedure. Total intra-service moderate sedation time: 38 minutes. Patient's level of consciousness and vital signs were monitored continuously by radiology nurse throughout the procedure under the supervision of the provider performing the procedure. COMPLICATIONS: None immediate. PROCEDURE: Informed written consent was obtained from the patient after a thorough discussion of the procedural risks, benefits and alternatives. All questions were addressed. Maximal Sterile Barrier Technique was utilized including caps, mask, sterile gowns, sterile gloves, sterile drape, hand hygiene and skin antiseptic. A timeout was performed prior to the initiation of the procedure. Patient was placed supine on the CT scanner. CT images of the abdomen were obtained. The left side of the abdomen was prepped with chlorhexidine and sterile field was created. Skin was anesthetized using 1% lidocaine. Using CT guidance, an 18 gauge trocar needle was directed into the left abdominal fluid collection and yellow serous-looking fluid was aspirated. Superstiff Amplatz wire was advanced into the collection and  the tract was dilated to accommodate a 10 [place] multipurpose drain. Additional yellow fluid was aspirated. Drain was attached to a suction bulb. Drain was sutured to skin. Attention was directed to the small anterior abdominal fluid collection. The right anterior  abdomen was prepped with chlorhexidine. Sterile field was created. Skin was anesthetized using 1% lidocaine. Using CT guidance, 18 gauge trocar needle was directed into the anterior abdominal fluid collection. Initially, no fluid could be aspirated. Eventually a small amount of thick bloody fluid was aspirated. This collection did not appear to be amendable for drain placement. Needle was removed. RADIATION DOSE REDUCTION: This exam was performed according to the departmental dose-optimization program which includes automated exposure control, adjustment of the mA and/or kV according to patient size and/or use of iterative reconstruction technique. FINDINGS: 73 French drain was placed within the small left lateral abdominal fluid collection. Yellow fluid was aspirated from this collection. Small amount of thick bloody fluid was aspirated from the anterior abdominal fluid collection. IMPRESSION: 1. CT-guided placement of a drain in the left lateral abdominal fluid collection. 2. CT-guided aspiration of a small amount of bloody fluid from the anterior abdominal collection. Electronically Signed   By: Richarda Overlie M.D.   On: 09/04/2022 08:09   CT ABDOMEN PELVIS W CONTRAST  Result Date: 09/03/2022 CLINICAL DATA:  47 year old female with history of postoperative abdominal pain. EXAM: CT ABDOMEN AND PELVIS WITH CONTRAST TECHNIQUE: Multidetector CT imaging of the abdomen and pelvis was performed using the standard protocol following bolus administration of intravenous contrast. RADIATION DOSE REDUCTION: This exam was performed according to the departmental dose-optimization program which includes automated exposure control, adjustment of the mA and/or kV according to patient size and/or use of iterative reconstruction technique. CONTRAST:  75mL OMNIPAQUE IOHEXOL 350 MG/ML SOLN COMPARISON:  CT of the abdomen and pelvis 08/24/2022. FINDINGS: Lower chest: Trace left pleural effusion. Bibasilar areas of subsegmental  atelectasis are noted in the lungs. Central venous catheter tip terminating in the right atrium. Nasogastric tube extending into the stomach. Hepatobiliary: Liver is enlarged measuring 24.3 cm in craniocaudal span. No suspicious cystic or solid hepatic lesions. No intra or extrahepatic biliary ductal dilatation. Gallbladder is unremarkable in appearance. Pancreas: No pancreatic mass. No pancreatic ductal dilatation. No pancreatic or peripancreatic fluid collections or inflammatory changes. Spleen: Unremarkable. Adrenals/Urinary Tract: Subcentimeter low-attenuation lesion in the lower pole of the left kidney, too small to definitively characterize, but statistically likely a cyst (no imaging follow-up recommended). 3 mm nonobstructive calculus in the upper pole collecting system of the left kidney. No aggressive appearing renal lesions. No hydroureteronephrosis. Small amount of gas non dependently in the lumen of the urinary bladder. Urinary bladder is otherwise normal in appearance. Bilateral adrenal glands are normal in appearance. Stomach/Bowel: Nasogastric tube is coiled in the stomach with tip in the antrum. Stomach is otherwise unremarkable in appearance. 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 No pathologic dilatation of small bowel or colon. Status post appendectomy. Numerous colonic diverticula are noted, particularly in the region of the sigmoid colon, without definite focal surrounding inflammatory changes to clearly indicate an acute diverticulitis at this time. Vascular/Lymphatic: No significant atherosclerotic disease, aneurysm or dissection noted in the abdominal or pelvic vasculature. Numerous prominent borderline enlarged and mildly enlarged abdominal and retroperitoneal lymph nodes are noted, measuring up to 1.2 cm in short axis in the left para-aortic nodal  station (axial image  49 of series 3), likely reactive. Reproductive: Uterus and ovaries are unremarkable in appearance. Other: Healing midline abdominal wound. Surgical drain extending into the right-side of the low anatomic pelvis. Small volume of pneumoperitoneum. Widespread peritoneal enhancement with numerous focal rim enhancing collections of low-attenuation fluid scattered throughout the peritoneal cavity, compatible with peritonitis and multiple intra-abdominal abscesses. The largest of these is in the left pericolic gutter (axial image 54 of series 3 and coronal image 56 of series 6) measuring up to 9.9 x 4.8 x 5.4 cm. Another prominent collection is located immediately deep to the surgical wound in the midline (axial image 62 of series 3 and sagittal image 69 of series 7) measuring approximately 5.0 x 1.5 x 6.6 cm. Musculoskeletal: There are no aggressive appearing lytic or blastic lesions noted in the visualized portions of the skeleton. IMPRESSION: 1. Postoperative changes of recent partial colectomy with left lower quadrant colostomy and partial small bowel resection, with evidence of peritonitis and multiple intra-abdominal abscesses, as detailed above. 2. Multiple borderline enlarged and mildly enlarged abdominal and retroperitoneal lymph nodes, presumably reactive. 3. Hepatomegaly. 4. Trace left pleural effusion. 5. Dependent areas of subsegmental atelectasis in the lower lobes of the lungs bilaterally. 6. Additional incidental findings, as above. These results will be called to the ordering clinician or representative by the Radiologist Assistant, and communication documented in the PACS or [employer]. Electronically Signed   By: Trudie Reed M.D.   On: 09/03/2022 08:45   DG Abd 1 View  Result Date: 09/01/2022 CLINICAL DATA:  Ileus EXAM: ABDOMEN - 1 VIEW COMPARISON:  CT abdomen and pelvis with contrast August 24, 2022, abdominal radiograph August 24, 2022 FINDINGS: NG tube is coiled in the  stomach. Drainage catheter coiled in the pelvis. Paucity of bowel gas limits evaluation. There is gas and stool seen within the colon in a nonobstructive pattern. Phleboliths seen in the pelvis. The visualized bibasilar lungs clear. No acute osseous abnormality. IMPRESSION: Paucity of bowel gas limits evaluation. However, there is gas and stool seen in the colon in a nonobstructive pattern. Electronically Signed   By: Jacob Moores M.D.   On: 09/01/2022 08:32    Labs:  CBC: Recent Labs    09/01/22 1549 09/02/22 1105 09/03/22 0227 09/04/22 0300  WBC 16.8* 19.3* 16.6* 16.7*  HGB 7.6* 8.3* 7.6* 7.6*  HCT 25.9* 26.4* 24.3* 24.1*  PLT 640* 842* 756* 836*    COAGS: Recent Labs    09/03/22 1257  INR 1.2    BMP: Recent Labs    09/01/22 0425 09/02/22 0321 09/03/22 0227 09/04/22 0300  NA 132* 132* 133* 131*  K 4.0 4.5 4.0 3.9  CL 101 102 102 100  CO2 23 23 25 24   GLUCOSE 171* 181* 136* 124*  BUN 13 12 11 12   CALCIUM 7.8* 7.9* 7.9* 7.8*  CREATININE 0.41* 0.66 0.56 0.67  GFRNONAA >60 >60 >60 >60    LIVER FUNCTION TESTS: Recent Labs    08/25/22 0416 08/27/22 0504 08/30/22 0428 09/02/22 0321  BILITOT 2.1* 1.7* 2.1* 2.0*  AST 25 25 25 17   ALT 20 20 30 24   ALKPHOS 46 43 90 109  PROT 5.1* 4.6* 5.3* 6.5  ALBUMIN 2.0* 1.8* 1.5* 1.7*    Assessment and Plan:  Recent partial colectomy with LLQ colostomy, partial small bowel resection. Multiple post-op intra-abdominal abscesses s/p left lateral abdominal drain placement in IR 09/03/22  She is afebrile. WBC remains elevated. Fluid cultures pending.    Drain Location: LUQ Size: Fr  size: 10 Fr Date of placement: 09/03/22 Currently to: Drain collection device: suction bulb 24 hour output:  Output by Drain (mL) 09/02/22 0700 - 09/02/22 1459 09/02/22 1500 - 09/02/22 2259 09/02/22 2300 - 09/03/22 0659 09/03/22 0700 - 09/03/22 1459 09/03/22 1500 - 09/03/22 2259 09/03/22 2300 - 09/04/22 0659 09/04/22 0700 - 09/04/22 0954  Closed  System Drain 1 Right Abdomen Bulb (JP) 19 Fr.  20 20  45 35   Closed System Drain Lateral LLQ Bulb (JP) 10 Fr.     33 5     Interval imaging/drain manipulation:  None  Current examination: Flushes/aspirates easily.  Insertion site unremarkable. Suture and stat lock in place. Dressed appropriately.   Plan: Continue TID flushes with 5 cc NS. Record output Q shift. Dressing changes QD or PRN if soiled.  Call IR APP or on call IR MD if difficulty flushing or sudden change in drain output.  Repeat imaging/possible drain injection once output < 10 mL/QD (excluding flush material). Consideration for drain removal if output is < 10 mL/QD (excluding flush material), pending discussion with the providing surgical service.  Discharge planning: Please contact IR APP or on call IR MD prior to patient d/c to ensure appropriate follow up plans are in place. Typically patient will follow up with IR clinic 10-14 days post d/c for repeat imaging/possible drain injection. IR scheduler will contact patient with date/time of appointment. Patient will need to flush drain QD with 5 cc NS, record output QD, dressing changes every 2-3 days or earlier if soiled.   IR will continue to follow - please call with questions or concerns.   Electronically Signed: Alwyn Ren, AGACNP-BC [phone] 09/04/2022, 9:51 AM   I spent a total of 15 Minutes at the the patient's bedside AND on the patient's hospital floor or unit, greater than 50% of which was counseling/coordinating care for intra-abdominal abscess drain.

## 2022-09-04 NOTE — Progress Notes (Signed)
PHARMACY - TOTAL PARENTERAL NUTRITION CONSULT NOTE   Indication: Prolonged NPO status   Patient Measurements: Height: 5\' 3"  (160 cm) Weight: 97.3 kg (214 lb 8.1 oz) IBW/kg (Calculated) : 52.4 TPN AdjBW (KG): 60.6 Body mass index is 38 kg/m. Usual Weight: 85 kg  Assessment:   47 y.o. F with history of a known pelvic mass that is follow by Dr. Pricilla Holm with [facility].  She underwent attempted resection in 2022 but given this was so adhered to multiple surrounding structures, it was unable to be removed. Biopsies were obtained at this time and this was not found to be malignant. Her last MRI revealed this was smaller in size. She reports being in her normal state of health until Saturday when she developed an acute onset of pelvic cramping and pain. Admitted for septic shock, started on Zosyn. Leukocytosis now resolved, improved hemodynamics. Renal function improving. Anticipated prolonged NPO status per Surgery. Pharmacy consulted to initiate TPN.   Glucose / Insulin: no hx DM, CBGs <180, off SSI Electrolytes: Na 131 (max in TPN), CoCa 9.6, others wnl Renal: Scr 0.67 (bsl <1), BUN wnl Hepatic: LFTs wnl, tbili 2, Alb 1.7, Hx hypertriglyceridemia TG 256  Intake / Output; MIVF: UOP 0.6 ml/kg/hr, NGT , drains 118 mL, colostomy 0ml charted, Net + 7.1L  GI Imaging: 7/2 CT abd: Pneumomediastinum extending into the lower neck. There is pneumoperitoneum in the upper abdomen suggesting possible bowel perforation. Fatty liver. GI Surgeries / Procedures:  7/2 OR for ex-lap, area of small bowel resection - left in discontinuity with open abdomen 7/4  OR for washout, hartmann's procedure, SB re-anastomosis, abdominal closure  7/12 IR Drain placed   Central access: 7/3 PICC  TPN start date: 7/4    Nutritional Goals: Goal TPN rate without lipids is 90 mL/hr (provides 125g protein and 1897 kcals)  RD Assessment: Estimated Needs Total Energy Estimated Needs: 1900-2100 Total Protein Estimated Needs:  105-125g Total Fluid Estimated Needs: >/=1.9L  Current Nutrition:  NPO w/ sips/ ice chips + 4oz juice BID to sip + TPN  Plan:  Continue TPN at goal 85 mL/hr at 1800, provides 124g protein and 1911 kcals, meeting 100% estimated needs Electrolytes in TPN: Na 150 mEq/L, increase K 30 mEq/L, Ca 5 mEq/L, Mg 14 mEq/L, Phos 14 mmol/L. Cl:Ac 1:1 Continue standard MVI and trace elements to TPN Monitor TPN labs on Mon/Thurs, daily as needed  F/u triglyceride 7/15 and increase lipids in TPN if stable  Thank you for allowing pharmacy to be a part of this patient's care.  Alphia Moh, PharmD, BCPS, BCCP Clinical Pharmacist  Please check AMION for all [facility] Pharmacy phone numbers After 10:00 PM, call Main Pharmacy [phone]

## 2022-09-05 LAB — CBC
HCT: 24.9 % — ABNORMAL LOW (ref 36.0–46.0)
Hemoglobin: 8 g/dL — ABNORMAL LOW (ref 12.0–15.0)
MCH: 33.6 pg (ref 26.0–34.0)
MCHC: 32.1 g/dL (ref 30.0–36.0)
MCV: 104.6 fL — ABNORMAL HIGH (ref 80.0–100.0)
Platelets: 1011 10*3/uL (ref 150–400)
RBC: 2.38 MIL/uL — ABNORMAL LOW (ref 3.87–5.11)
RDW: 17.3 % — ABNORMAL HIGH (ref 11.5–15.5)
WBC: 15.7 10*3/uL — ABNORMAL HIGH (ref 4.0–10.5)
nRBC: 0 % (ref 0.0–0.2)

## 2022-09-05 MED ORDER — TRAVASOL 10 % IV SOLN
INTRAVENOUS | Status: AC
  Filled 2022-09-05: qty 1244.4

## 2022-09-05 NOTE — Progress Notes (Signed)
Assessment & Plan: HD#13 Pneumoperitoneum, retroperitoneal air, mediastinal air likely secondary to perforated rectosigmoid colon with history of pelvic mass  7/2: ex lap extensive LOA, SBR in discontinuity, placement of pelvic drain, placement of ABThera vacuum dressing - Dr. Corliss Skains   7/4: ex lap, hartmann's procedure,  washout, small bowel anastomosis (re-establish continuity), drainage LUQ IAA, abdominal closure - Dr. Cliffton Asters   - WBC 15.7, improving - CT w/ multiple fluid collectons, IR drainage performed 7/12 on left - serous output - Clamp NG today - Clear liquid diet today - Continue TPN - VAC change M/W/F by RN staff.  - Multimodal pain control: continue robaxin, IV tylenol, prn dilaudid   - VTE: lovenox, SCDs - Dispo: inpatient, med-surg floor  Will clamp NG and give trial of clear liquid diet today.  Encouraged OOB, ambulation.        Brittney Level, MD [facility] practice Office: [phone]        Chief Complaint: Perforated viscus  Subjective: Patient in bed, family in room.  Doing well.  Anxious to try liquids.  Objective: Vital signs in last 24 hours: Temp:  [97.7 F (36.5 C)-99.2 F (37.3 C)] 97.7 F (36.5 C) (07/14 0824) Pulse Rate:  [85-97] 97 (07/14 0824) Resp:  [16-18] 17 (07/14 0824) BP: (121-139)/(81-91) 127/91 (07/14 0824) SpO2:  [99 %-100 %] 99 % (07/14 0824) Last BM Date : 09/04/22  Intake/Output from previous day: 07/13 0701 - 07/14 0700 In: -  Out: 1750 [Urine:900; Emesis/NG output:800; Drains:50] Intake/Output this shift: Total I/O In: -  Out: 100 [Stool:100]  Physical Exam: HEENT - sclerae clear, mucous membranes moist Neck - soft Abdomen - soft, stoma viable, VAC dressing intact Ext - no edema, non-tender Neuro - alert & oriented, no focal deficits  Lab Results:  Recent Labs    09/04/22 0300 09/05/22 0334  WBC 16.7* 15.7*  HGB 7.6* 8.0*  HCT 24.1* 24.9*  PLT 836* 1,011*   BMET Recent Labs     09/03/22 0227 09/04/22 0300  NA 133* 131*  K 4.0 3.9  CL 102 100  CO2 25 24  GLUCOSE 136* 124*  BUN 11 12  CREATININE 0.56 0.67  CALCIUM 7.9* 7.8*   PT/INR Recent Labs    09/03/22 1257  LABPROT 15.0  INR 1.2   Comprehensive Metabolic Panel:    Component Value Date/Time   NA 131 (L) 09/04/2022 0300   NA 133 (L) 09/03/2022 0227   K 3.9 09/04/2022 0300   K 4.0 09/03/2022 0227   CL 100 09/04/2022 0300   CL 102 09/03/2022 0227   CO2 24 09/04/2022 0300   CO2 25 09/03/2022 0227   BUN 12 09/04/2022 0300   BUN 11 09/03/2022 0227   CREATININE 0.67 09/04/2022 0300   CREATININE 0.56 09/03/2022 0227   GLUCOSE 124 (H) 09/04/2022 0300   GLUCOSE 136 (H) 09/03/2022 0227   CALCIUM 7.8 (L) 09/04/2022 0300   CALCIUM 7.9 (L) 09/03/2022 0227   AST 17 09/02/2022 0321   AST 25 08/30/2022 0428   ALT 24 09/02/2022 0321   ALT 30 08/30/2022 0428   ALKPHOS 109 09/02/2022 0321   ALKPHOS 90 08/30/2022 0428   BILITOT 2.0 (H) 09/02/2022 0321   BILITOT 2.1 (H) 08/30/2022 0428   PROT 6.5 09/02/2022 0321   PROT 5.3 (L) 08/30/2022 0428   ALBUMIN 1.7 (L) 09/02/2022 0321   ALBUMIN 1.5 (L) 08/30/2022 0428    Studies/Results: CT GUIDED VISCERAL FLUID DRAIN BY PERC CATH  Result Date: 09/04/2022 INDICATION: 47 year old with postoperative abdominal fluid collections. EXAM: 1. CT-guided placement of drain in left abdominal fluid collection 2. CT-guided aspiration of anterior abdominal fluid collection MEDICATIONS: Moderate sedation ANESTHESIA/SEDATION: Moderate (conscious) sedation was employed during this procedure. A total of Versed 2 mg and fentanyl 200 mcg was administered intravenously at the order of the provider performing the procedure. Total intra-service moderate sedation time: 38 minutes. Patient's Herrera of consciousness and vital signs were monitored continuously by radiology nurse throughout the procedure under the supervision of the provider performing the procedure. COMPLICATIONS: None  immediate. PROCEDURE: Informed written consent was obtained from the patient after a thorough discussion of the procedural risks, benefits and alternatives. All questions were addressed. Maximal Sterile Barrier Technique was utilized including caps, mask, sterile gowns, sterile gloves, sterile drape, hand hygiene and skin antiseptic. A timeout was performed prior to the initiation of the procedure. Patient was placed supine on the CT scanner. CT images of the abdomen were obtained. The left side of the abdomen was prepped with chlorhexidine and sterile field was created. Skin was anesthetized using 1% lidocaine. Using CT guidance, an 18 gauge trocar needle was directed into the left abdominal fluid collection and yellow serous-looking fluid was aspirated. Superstiff Amplatz wire was advanced into the collection and the tract was dilated to accommodate a 10 [place] multipurpose drain. Additional yellow fluid was aspirated. Drain was attached to a suction bulb. Drain was sutured to skin. Attention was directed to the small anterior abdominal fluid collection. The right anterior abdomen was prepped with chlorhexidine. Sterile field was created. Skin was anesthetized using 1% lidocaine. Using CT guidance, 18 gauge trocar needle was directed into the anterior abdominal fluid collection. Initially, no fluid could be aspirated. Eventually a small amount of thick bloody fluid was aspirated. This collection did not appear to be amendable for drain placement. Needle was removed. RADIATION DOSE REDUCTION: This exam was performed according to the departmental dose-optimization program which includes automated exposure control, adjustment of the mA and/or kV according to patient size and/or use of iterative reconstruction technique. FINDINGS: 27 French drain was placed within the small left lateral abdominal fluid collection. Yellow fluid was aspirated from this collection. Small amount of thick bloody fluid was aspirated from  the anterior abdominal fluid collection. IMPRESSION: 1. CT-guided placement of a drain in the left lateral abdominal fluid collection. 2. CT-guided aspiration of a small amount of bloody fluid from the anterior abdominal collection. Electronically Signed   By: Richarda Overlie M.D.   On: 09/04/2022 08:09      Brittney Herrera 09/05/2022  Patient ID: Brittney Herrera, female   DOB: 02-Oct-1975, 47 y.o.   MRN: [id number]

## 2022-09-05 NOTE — Plan of Care (Signed)
Patient showing progress

## 2022-09-05 NOTE — Progress Notes (Signed)
PHARMACY - TOTAL PARENTERAL NUTRITION CONSULT NOTE   Indication: Prolonged NPO status   Patient Measurements: Height: 5\' 3"  (160 cm) Weight: 97.3 kg (214 lb 8.1 oz) IBW/kg (Calculated) : 52.4 TPN AdjBW (KG): 60.6 Body mass index is 38 kg/m. Usual Weight: 85 kg  Assessment:   47 y.o. F with history of a known pelvic mass that is follow by Dr. Pricilla Holm with [facility].  She underwent attempted resection in 2022 but given this was so adhered to multiple surrounding structures, it was unable to be removed. Biopsies were obtained at this time and this was not found to be malignant. Her last MRI revealed this was smaller in size. She reports being in her normal state of health until Saturday when she developed an acute onset of pelvic cramping and pain. Admitted for septic shock, started on Zosyn. Leukocytosis now resolved, improved hemodynamics. Renal function improving. Anticipated prolonged NPO status per Surgery. Pharmacy consulted to initiate TPN.   Glucose / Insulin: no hx DM, CBGs <180, off SSI Electrolytes: last labs 7/13: Na 131 (max in TPN), CoCa 9.6, others wnl Renal: Scr 0.67 (bsl <1), BUN wnl Hepatic: LFTs wnl, tbili 2, Alb 1.7, Hx hypertriglyceridemia TG 256  Intake / Output; MIVF: UOP 0.8 ml/kg/hr, NGT , drains 50 mL, colostomy , Net + 5.3L  GI Imaging: 7/2 CT abd: Pneumomediastinum extending into the lower neck. There is pneumoperitoneum in the upper abdomen suggesting possible bowel perforation. Fatty liver. 7/12 CT: peritonitis and multiple intra-abd abscesses, reactive retroperitoneal lymph nodes GI Surgeries / Procedures:  7/2 OR for ex-lap, area of small bowel resection - left in discontinuity with open abdomen 7/4  OR for washout, hartmann's procedure, SB re-anastomosis, abdominal closure  7/12 IR Drain placed  Central access: 7/3 PICC  TPN start date: 7/4    Nutritional Goals: Goal TPN rate is 85 mL/hr (provides 124g protein and 1911 kcals)  RD  Assessment: Estimated Needs Total Energy Estimated Needs: 1900-2100 Total Protein Estimated Needs: 105-125g Total Fluid Estimated Needs: >/=1.9L  Current Nutrition:  NPO w/ sips/ ice chips + TPN  Plan:  Continue TPN at goal 85 mL/hr at 1800, meeting 100% estimated needs Electrolytes in TPN: Na 150 mEq/L, K 30 mEq/L, Ca 5 mEq/L, Mg 14 mEq/L, Phos 14 mmol/L. Cl:Ac 1:1 Continue standard MVI and trace elements to TPN Monitor TPN labs on Mon/Thurs, daily as needed  F/u triglycerides 7/15 and increase lipids in TPN if stable  Thank you for allowing pharmacy to be a part of this patient's care.  Alphia Moh, PharmD, BCPS, BCCP Clinical Pharmacist  Please check AMION for all [facility] Pharmacy phone numbers After 10:00 PM, call Main Pharmacy [phone]

## 2022-09-06 LAB — BODY FLUID CULTURE W GRAM STAIN: Culture: NO GROWTH

## 2022-09-06 LAB — COMPREHENSIVE METABOLIC PANEL
ALT: 25 U/L (ref 0–44)
AST: 22 U/L (ref 15–41)
Albumin: 2 g/dL — ABNORMAL LOW (ref 3.5–5.0)
Alkaline Phosphatase: 168 U/L — ABNORMAL HIGH (ref 38–126)
Anion gap: 12 (ref 5–15)
BUN: 13 mg/dL (ref 6–20)
CO2: 25 mmol/L (ref 22–32)
Calcium: 8.5 mg/dL — ABNORMAL LOW (ref 8.9–10.3)
Chloride: 97 mmol/L — ABNORMAL LOW (ref 98–111)
Creatinine, Ser: 0.67 mg/dL (ref 0.44–1.00)
GFR, Estimated: >60 mL/min (ref >60)
Glucose, Bld: 116 mg/dL — ABNORMAL HIGH (ref 70–99)
Potassium: 4.3 mmol/L (ref 3.5–5.1)
Sodium: 134 mmol/L — ABNORMAL LOW (ref 135–145)
Total Bilirubin: 1.1 mg/dL (ref 0.3–1.2)
Total Protein: 7.1 g/dL (ref 6.5–8.1)

## 2022-09-06 LAB — MAGNESIUM: Magnesium: 2.1 mg/dL (ref 1.7–2.4)

## 2022-09-06 LAB — PHOSPHORUS: Phosphorus: 4.6 mg/dL (ref 2.5–4.6)

## 2022-09-06 LAB — TRIGLYCERIDES: Triglycerides: 186 mg/dL — ABNORMAL HIGH (ref <150)

## 2022-09-06 MED ORDER — TRAVASOL 10 % IV SOLN: INTRAVENOUS | Status: DC

## 2022-09-06 MED ORDER — TRAVASOL 10 % IV SOLN
INTRAVENOUS | Status: AC
  Filled 2022-09-06: qty 1183.2

## 2022-09-06 MED ORDER — METHOCARBAMOL 500 MG PO TABS
1000.0000 mg | ORAL_TABLET | Freq: Four times a day (QID) | ORAL | Status: DC
  Administered 2022-09-06 – 2022-09-08 (×10): 1000 mg via ORAL
  Filled 2022-09-06 (×10): qty 2

## 2022-09-06 MED ORDER — OXYCODONE HCL 5 MG PO TABS
10.0000 mg | ORAL_TABLET | ORAL | Status: DC | PRN
  Administered 2022-09-06 – 2022-09-08 (×5): 10 mg via ORAL
  Filled 2022-09-06 (×5): qty 2

## 2022-09-06 MED ORDER — IBUPROFEN 600 MG PO TABS
600.0000 mg | ORAL_TABLET | Freq: Three times a day (TID) | ORAL | Status: DC
  Administered 2022-09-06 – 2022-09-08 (×7): 600 mg via ORAL
  Filled 2022-09-06 (×7): qty 1

## 2022-09-06 MED ORDER — SODIUM CHLORIDE 0.9 % IV SOLN
3.0000 g | Freq: Four times a day (QID) | INTRAVENOUS | Status: DC
  Administered 2022-09-06 – 2022-09-08 (×8): 3 g via INTRAVENOUS
  Filled 2022-09-06 (×9): qty 8

## 2022-09-06 MED ORDER — ACETAMINOPHEN 500 MG PO TABS
1000.0000 mg | ORAL_TABLET | Freq: Four times a day (QID) | ORAL | Status: DC
  Administered 2022-09-06 – 2022-09-08 (×8): 1000 mg via ORAL
  Filled 2022-09-06 (×9): qty 2

## 2022-09-06 MED ORDER — ALTEPLASE 2 MG IJ SOLR: 2.0000 mg | Freq: Once | INTRAMUSCULAR | Status: DC

## 2022-09-06 MED ORDER — ENSURE ENLIVE PO LIQD
237.0000 mL | Freq: Two times a day (BID) | ORAL | Status: DC
  Administered 2022-09-06 – 2022-09-08 (×3): 237 mL via ORAL

## 2022-09-06 NOTE — Progress Notes (Signed)
Assessment & Plan: HD#14 Pneumoperitoneum, retroperitoneal air, mediastinal air likely secondary to perforated rectosigmoid colon with history of pelvic mass  7/2: ex lap extensive LOA, SBR in discontinuity, placement of pelvic drain, placement of ABThera vacuum dressing - Dr. Corliss Skains   7/4: ex lap, hartmann's procedure,  washout, small bowel anastomosis (re-establish continuity), drainage LUQ IAA, abdominal closure - Dr. Cliffton Asters   - WBC 15.7 yesterday, improving - CT w/ multiple fluid collectons, IR drainage performed 7/12 on left - serous output, Cx w/ E. Coli (pansensitive) - tolerated NG clamp trial with CLD. Ostomy productive of gas and small amt stool, remove NGT today - Full liquid diet today, start weaning TPN - VAC change M/W/F by RN staff.  - Multimodal pain control: 1,000 mg tylenol q 6h, advil 600 mg TID, robaxin 1,000 mg QID, oxy 10 mg q 4h PRN, HM for breakthrough - ID: Zosyn - stop today, narrow to Unasyn based on Cx from 7/12  - VTE: lovenox, SCDs - Dispo: inpatient, med-surg floor  Will clamp NG and give trial of clear liquid diet today.  Encouraged OOB, ambulation.        Hosie Spangle, [facility] practice Office: [phone]        Chief Complaint: Perforated viscus  Subjective: Overall doing better. Tolerating CLD. Walking in halls. Denies nausea or emesis. Having gas via colostomy and small amt dark stool.   Objective: Vital signs in last 24 hours: Temp:  [97.9 F (36.6 C)-98.6 F (37 C)] 98.6 F (37 C) (07/15 0358) Pulse Rate:  [88-91] 88 (07/15 0358) Resp:  [17-18] 17 (07/15 0358) BP: (118-130)/(79-85) 118/79 (07/15 0358) SpO2:  [97 %-100 %] 98 % (07/15 0358) Weight:  [97.9 kg] 97.9 kg (07/15 0404) Last BM Date : 09/05/22 (colostomy)  Intake/Output from previous day: 07/14 0701 - 07/15 0700 In: -  Out: 645 [Urine:400; Emesis/NG output:100; Drains:45; Stool:100] Intake/Output this shift: No intake/output data  recorded.  Physical Exam: HEENT - sclerae clear, mucous membranes moist Neck - soft Abdomen - soft, stoma viable, VAC dressing intact Ext - no edema, non-tender Neuro - alert & oriented, no focal deficits  Lab Results:  Recent Labs    09/04/22 0300 09/05/22 0334  WBC 16.7* 15.7*  HGB 7.6* 8.0*  HCT 24.1* 24.9*  PLT 836* 1,011*   BMET Recent Labs    09/04/22 0300 09/06/22 0332  NA 131* 134*  K 3.9 4.3  CL 100 97*  CO2 24 25  GLUCOSE 124* 116*  BUN 12 13  CREATININE 0.67 0.67  CALCIUM 7.8* 8.5*   PT/INR Recent Labs    09/03/22 1257  LABPROT 15.0  INR 1.2   Comprehensive Metabolic Panel:    Component Value Date/Time   NA 134 (L) 09/06/2022 0332   NA 131 (L) 09/04/2022 0300   K 4.3 09/06/2022 0332   K 3.9 09/04/2022 0300   CL 97 (L) 09/06/2022 0332   CL 100 09/04/2022 0300   CO2 25 09/06/2022 0332   CO2 24 09/04/2022 0300   BUN 13 09/06/2022 0332   BUN 12 09/04/2022 0300   CREATININE 0.67 09/06/2022 0332   CREATININE 0.67 09/04/2022 0300   GLUCOSE 116 (H) 09/06/2022 0332   GLUCOSE 124 (H) 09/04/2022 0300   CALCIUM 8.5 (L) 09/06/2022 0332   CALCIUM 7.8 (L) 09/04/2022 0300   AST 22 09/06/2022 0332   AST 17 09/02/2022 0321   ALT 25 09/06/2022 0332   ALT 24 09/02/2022 0321  ALKPHOS 168 (H) 09/06/2022 0332   ALKPHOS 109 09/02/2022 0321   BILITOT 1.1 09/06/2022 0332   BILITOT 2.0 (H) 09/02/2022 0321   PROT 7.1 09/06/2022 0332   PROT 6.5 09/02/2022 0321   ALBUMIN 2.0 (L) 09/06/2022 0332   ALBUMIN 1.7 (L) 09/02/2022 0321    Studies/Results: No results found.    [name] 09/06/2022  Patient ID: Brittney Herrera, female   DOB: 21-Dec-1975, 47 y.o.   MRN: [id number]

## 2022-09-06 NOTE — Progress Notes (Addendum)
Pharmacy Antibiotic Note  Brittney Herrera is a 47 y.o. female admitted on 08/24/2022 with  Pneumoperitoneum 2/2 perforated colon and pelvic abscess .  Pharmacy has been consulted for ampicillin-sulbactam dosing. She was started on Zosyn on 7/2 for septic shock. Hemodynamics and renal function have improved. Patient now being transitioned to ampicillin-sulbactam per MD.   Plan: Start ampicillin-sulbactam 3 gm every 6 hours   Monitor pending peritoneal fluid cx, CBC, vital signs, and Abx LOT  Height: 5\' 3"  (160 cm) Weight: 97.9 kg (215 lb 13.3 oz) IBW/kg (Calculated) : 52.4  Temp (24hrs), Avg:98.2 F (36.8 C), Min:97.7 F (36.5 C), Max:98.6 F (37 C)  Recent Labs  Lab 09/01/22 0425 09/01/22 1549 09/02/22 0321 09/02/22 1105 09/03/22 0227 09/04/22 0300 09/05/22 0334 09/06/22 0332  WBC  --  16.8*  --  19.3* 16.6* 16.7* 15.7*  --   CREATININE 0.41*  --  0.66  --  0.56 0.67  --  0.67    Estimated Creatinine Clearance: 100 mL/min (by C-G formula based on SCr of 0.67 mg/dL).    Allergies  Allergen Reactions   Prednisone Swelling    Facial swelling     Tramadol Other (See Comments)    "Irritability"    Antimicrobials this admission: Zosyn 7/2 >> 7/15 Unasyn 7/15 >>   Microbiology results: 7/2 BCx: 1/4 staph haemolyticus (likely contaminant per ID) 7/3 MRSA PCR: negative 7/12 Peritoneal Fluid Cx: ngtd  7/14 Wound Abdomen Fluid Cx: E. Coli  Thank you for allowing pharmacy to be a part of this patient's care.  Enos Fling, PharmD PGY-1 Acute Care Pharmacy Resident 09/06/2022 8:17 AM

## 2022-09-06 NOTE — Consult Note (Signed)
WOC Nurse wound follow up Patient states she "must have Dilaudid"  before NPWT dressing change.  It is given as requested but I explain that she must try PO analgesia at next dressing change in preparation for discharge.  NG tube removed this AM.  She agrees to PO at next dressing change. She requests black foam be moistened with saline prior to removal.   Wound type: midline surgical wound Measurement: to be assessed Wednesday Wound bed: Beefy red, bleeds with cleansing.   Drainage (amount, consistency, odor) moderate serosanguinous  no odor Periwound: LUQ colostomy Dressing procedure/placement/frequency: Dressing is moistened per request and removed.  Wound is cleansed and patted dry. Barrier ring to distal end of wound to promote seal at abdominal pannus.  2 pieces black foam removed and 2 pieces black foam replaced  COvered with drape.  Seal achieved at 125 mmHg  Will change again Wednesday. Does not overlap ostomy and the 2 can be changed separately.  We discuss three times weekly VAC changes and twice weekly pouch changes.  WOC Nurse ostomy follow up Spouse is not present today.  We discuss her need to take an active role in her ostomy care as spouse works and may not be present when ostomy care is to be performed.  She agrees to try.  She states she has emptied her pouch.  Stoma type/location: LUQ colostomy Stomal assessment/size: pink and moist, budded  1 1/2"   Peristomal assessment: intact   Treatment options for stomal/peristomal skin: barrier ring and 2 piece pouch Output soft brown stool Ostomy pouching: 2pc. Pouch with barrier ring Education provided: Patient states that she eats a vegan diet and has for decades.  I explain that she can continue to consume a vegan diet and stress the importance of sufficient protein intake.   Patient assists with pouch removal and observes me clean the stoma and surrounding skin with water.  She has some difficulty observing her stoma and agrees that  standing in front of a mirror will be helpful.  She assists with barrier ring application (directly to peristomal skin) and she cuts the barrier independently.  She assembles pouch and barrier and applies the system.  She is able to roll pouch closed and simulates emptying with me, cleaning the bottom of the pouch with toilet paper.  She did very well and is proud of herself once finished.  Enrolled patient in [employer] Secure Start Discharge program: Yes Will follow.   Mike Gip MSN, RN, FNP-BC CWON Wound, Ostomy, Continence Nurse [facility] [phone] Pager [phone]

## 2022-09-06 NOTE — Progress Notes (Signed)
Nutrition Follow-up  DOCUMENTATION CODES:   Obesity unspecified  INTERVENTION:  - Continue TPN per pharmacy management.   - Continue full liquid diet.   - Continue Ensure Enlive po BID, each supplement provides 350 kcal and 20 grams of protein.  NUTRITION DIAGNOSIS:   Inadequate oral intake related to acute illness as evidenced by NPO status. - Progressing  GOAL:   Patient will meet greater than or equal to 90% of their needs  MONITOR:   Vent status, Labs, Weight trends, I & O's  REASON FOR ASSESSMENT:   Ventilator    ASSESSMENT:   Pt admitted with pneumoperitoneum. PMH significant for pelvic mass, s/p attempted resection (2022), and open incisional hernia repair (2023).  Meds reviewed: WDL. Labs reviewed:  Na low, chloride low, alk phos elevated.   NG tube removed today   7/2: ex lap extensive LOA, SBR in discontinuity, placement of pelvic drain, placement of ABThera vacuum dressing 7/4: ex lap, hartmann's procedure,  washout, small bowel anastomosis (re-establish continuity), drainage LUQ IAA, abdominal closure  7/5: extubated 7/7: diet order updated to allow 4 oz apple juice BID to sip per surgery 7/8: wound VAC placed to midline surgical incision 7/15: NG tube removed; diet adv to full liquids.   Diet was just advanced this am. Ensure BID was added. TPN still continues per phar,acy management and Is meeting 100% of the pt's needs. RD will continue to closely monitor PO intakes and diet advancement.   Diet Order:   Diet Order             Diet full liquid Fluid consistency: Thin  Diet effective now                   EDUCATION NEEDS:   No education needs have been identified at this time  Skin:  Skin Assessment: Skin Integrity Issues: Skin Integrity Issues:: Incisions Wound Vac: wound VAC placed to midline surgical incision Incisions: midline surgical incision  Last BM:  100 mL via colostomy (in 24 hrs)  Height:   Ht Readings from Last 1  Encounters:  08/24/22 5\' 3"  (1.6 m)    Weight:   Wt Readings from Last 1 Encounters:  09/06/22 97.9 kg    Ideal Body Weight:  52.3 kg  BMI:  Body mass index is 38.23 kg/m.  Estimated Nutritional Needs:   Kcal:  1900-2100  Protein:  105-125g  Fluid:  >/=1.9L  Bethann Humble, RD, LDN, CNSC.

## 2022-09-06 NOTE — Progress Notes (Signed)
Referring Physician(s): Dr. Corliss Skains and Dr. Cliffton Asters  Supervising Physician: Malachy Moan  Patient Status:  [name] - In-pt  Chief Complaint:  Post operative intra abdominal abscess s/p left abdominal fluid collection and aspiration of anterior abdominal fluid collection by Dr. Lowella Dandy on 7.12.24  Subjective:  Patient sleeping. Husband at bedside states that Patient is feeling better sine removal of the NG tube.   Allergies: Prednisone and Tramadol  Medications: Prior to Admission medications   Medication Sig Start Date End Date Taking? Authorizing Provider  gabapentin (NEURONTIN) 300 MG capsule Take 300 mg by mouth daily as needed (rheumatoid arthritis).   Yes [provider]  ibuprofen (ADVIL) 800 MG tablet Take 800 mg by mouth every 8 (eight) hours as needed for moderate pain (rheumatoid arthritis flares).   Yes [provider]  levothyroxine (SYNTHROID) 150 MCG tablet Take 150 mcg by mouth daily before breakfast.   Yes [provider]  oxyCODONE-acetaminophen (PERCOCET/ROXICET) 5-325 MG tablet Take 1 tablet by mouth every 6 (six) hours as needed for severe pain.   Yes [provider]     Vital Signs: BP 118/79 (BP Location: Left Arm)   Pulse 88   Temp 98.6 F (37 C) (Oral)   Resp 17   Ht 5\' 3"  (1.6 m)   Wt 215 lb 13.3 oz (97.9 kg)   SpO2 98%   BMI 38.23 kg/m   Physical Exam Vitals and nursing note reviewed.  Constitutional:      Appearance: She is well-developed.  HENT:     Head: Normocephalic and atraumatic.  Eyes:     Conjunctiva/sclera: Conjunctivae normal.  Pulmonary:     Effort: Pulmonary effort is normal.  Abdominal:     Comments: Positive LLQ  drain to  suction. Site is unremarkable with no erythema, edema, tenderness, bleeding or drainage noted at exit site. Suture and stat lock in place. Dressing is clean dry and intact. < 10  ml of  yellow clear colored fluid with large firbin sheath noted in  bulb suction device.  Unable to flush or aspirate the drain. RLQ was placed by  surgery,   Musculoskeletal:     Cervical back: Normal range of motion.  Neurological:     Mental Status: She is oriented to person, place, and time.     Imaging: CT GUIDED VISCERAL FLUID DRAIN BY PERC CATH  Result Date: 09/04/2022 INDICATION: 47 year old with postoperative abdominal fluid collections. EXAM: 1. CT-guided placement of drain in left abdominal fluid collection 2. CT-guided aspiration of anterior abdominal fluid collection MEDICATIONS: Moderate sedation ANESTHESIA/SEDATION: Moderate (conscious) sedation was employed during this procedure. A total of Versed 2 mg and fentanyl 200 mcg was administered intravenously at the order of the provider performing the procedure. Total intra-service moderate sedation time: 38 minutes. Patient's level of consciousness and vital signs were monitored continuously by radiology nurse throughout the procedure under the supervision of the provider performing the procedure. COMPLICATIONS: None immediate. PROCEDURE: Informed written consent was obtained from the patient after a thorough discussion of the procedural risks, benefits and alternatives. All questions were addressed. Maximal Sterile Barrier Technique was utilized including caps, mask, sterile gowns, sterile gloves, sterile drape, hand hygiene and skin antiseptic. A timeout was performed prior to the initiation of the procedure. Patient was placed supine on the CT scanner. CT images of the abdomen were obtained. The left side of the abdomen was prepped with chlorhexidine and sterile field was created. Skin was anesthetized using 1% lidocaine. Using CT guidance,  an 18 gauge trocar needle was directed into the left abdominal fluid collection and yellow serous-looking fluid was aspirated. Superstiff Amplatz wire was advanced into the collection and the tract was dilated to accommodate a 10 [place] multipurpose drain. Additional yellow fluid was  aspirated. Drain was attached to a suction bulb. Drain was sutured to skin. Attention was directed to the small anterior abdominal fluid collection. The right anterior abdomen was prepped with chlorhexidine. Sterile field was created. Skin was anesthetized using 1% lidocaine. Using CT guidance, 18 gauge trocar needle was directed into the anterior abdominal fluid collection. Initially, no fluid could be aspirated. Eventually a small amount of thick bloody fluid was aspirated. This collection did not appear to be amendable for drain placement. Needle was removed. RADIATION DOSE REDUCTION: This exam was performed according to the departmental dose-optimization program which includes automated exposure control, adjustment of the mA and/or kV according to patient size and/or use of iterative reconstruction technique. FINDINGS: 28 French drain was placed within the small left lateral abdominal fluid collection. Yellow fluid was aspirated from this collection. Small amount of thick bloody fluid was aspirated from the anterior abdominal fluid collection. IMPRESSION: 1. CT-guided placement of a drain in the left lateral abdominal fluid collection. 2. CT-guided aspiration of a small amount of bloody fluid from the anterior abdominal collection. Electronically Signed   By: Richarda Overlie M.D.   On: 09/04/2022 08:09   CT ABDOMEN PELVIS W CONTRAST  Result Date: 09/03/2022 CLINICAL DATA:  47 year old female with history of postoperative abdominal pain. EXAM: CT ABDOMEN AND PELVIS WITH CONTRAST TECHNIQUE: Multidetector CT imaging of the abdomen and pelvis was performed using the standard protocol following bolus administration of intravenous contrast. RADIATION DOSE REDUCTION: This exam was performed according to the departmental dose-optimization program which includes automated exposure control, adjustment of the mA and/or kV according to patient size and/or use of iterative reconstruction technique. CONTRAST:  75mL OMNIPAQUE  IOHEXOL 350 MG/ML SOLN COMPARISON:  CT of the abdomen and pelvis 08/24/2022. FINDINGS: Lower chest: Trace left pleural effusion. Bibasilar areas of subsegmental atelectasis are noted in the lungs. Central venous catheter tip terminating in the right atrium. Nasogastric tube extending into the stomach. Hepatobiliary: Liver is enlarged measuring 24.3 cm in craniocaudal span. No suspicious cystic or solid hepatic lesions. No intra or extrahepatic biliary ductal dilatation. Gallbladder is unremarkable in appearance. Pancreas: No pancreatic mass. No pancreatic ductal dilatation. No pancreatic or peripancreatic fluid collections or inflammatory changes. Spleen: Unremarkable. Adrenals/Urinary Tract: Subcentimeter low-attenuation lesion in the lower pole of the left kidney, too small to definitively characterize, but statistically likely a cyst (no imaging follow-up recommended). 3 mm nonobstructive calculus in the upper pole collecting system of the left kidney. No aggressive appearing renal lesions. No hydroureteronephrosis. Small amount of gas non dependently in the lumen of the urinary bladder. Urinary bladder is otherwise normal in appearance. Bilateral adrenal glands are normal in appearance. Stomach/Bowel: Nasogastric tube is coiled in the stomach with tip in the antrum. Stomach is otherwise unremarkable in appearance. 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 No pathologic dilatation of small bowel or colon. Status post appendectomy. Numerous colonic diverticula are noted, particularly in the region of the sigmoid colon, without definite focal surrounding inflammatory changes to clearly indicate an acute diverticulitis at this time. Vascular/Lymphatic: No significant atherosclerotic disease, aneurysm or dissection noted in the abdominal or pelvic vasculature. Numerous prominent  borderline enlarged and mildly enlarged abdominal and retroperitoneal lymph nodes are noted, measuring up to 1.2 cm in short axis in the left para-aortic nodal station (axial image 49 of series 3), likely reactive. Reproductive: Uterus and ovaries are unremarkable in appearance. Other: Healing midline abdominal wound. Surgical drain extending into the right-side of the low anatomic pelvis. Small volume of pneumoperitoneum. Widespread peritoneal enhancement with numerous focal rim enhancing collections of low-attenuation fluid scattered throughout the peritoneal cavity, compatible with peritonitis and multiple intra-abdominal abscesses. The largest of these is in the left pericolic gutter (axial image 54 of series 3 and coronal image 56 of series 6) measuring up to 9.9 x 4.8 x 5.4 cm. Another prominent collection is located immediately deep to the surgical wound in the midline (axial image 62 of series 3 and sagittal image 69 of series 7) measuring approximately 5.0 x 1.5 x 6.6 cm. Musculoskeletal: There are no aggressive appearing lytic or blastic lesions noted in the visualized portions of the skeleton. IMPRESSION: 1. Postoperative changes of recent partial colectomy with left lower quadrant colostomy and partial small bowel resection, with evidence of peritonitis and multiple intra-abdominal abscesses, as detailed above. 2. Multiple borderline enlarged and mildly enlarged abdominal and retroperitoneal lymph nodes, presumably reactive. 3. Hepatomegaly. 4. Trace left pleural effusion. 5. Dependent areas of subsegmental atelectasis in the lower lobes of the lungs bilaterally. 6. Additional incidental findings, as above. These results will be called to the ordering clinician or representative by the Radiologist Assistant, and communication documented in the PACS or [employer]. Electronically Signed   By: Trudie Reed M.D.   On: 09/03/2022 08:45    Labs:  CBC: Recent Labs    09/02/22 1105  09/03/22 0227 09/04/22 0300 09/05/22 0334  WBC 19.3* 16.6* 16.7* 15.7*  HGB 8.3* 7.6* 7.6* 8.0*  HCT 26.4* 24.3* 24.1* 24.9*  PLT 842* 756* 836* 1,011*    COAGS: Recent Labs    09/03/22 1257  INR 1.2    BMP: Recent Labs    09/02/22 0321 09/03/22 0227 09/04/22 0300 09/06/22 0332  NA 132* 133* 131* 134*  K 4.5 4.0 3.9 4.3  CL 102 102 100 97*  CO2 23 25 24 25   GLUCOSE 181* 136* 124* 116*  BUN 12 11 12 13   CALCIUM 7.9* 7.9* 7.8* 8.5*  CREATININE 0.66 0.56 0.67 0.67  GFRNONAA >60 >60 >60 >60    LIVER FUNCTION TESTS: Recent Labs    08/27/22 0504 08/30/22 0428 09/02/22 0321 09/06/22 0332  BILITOT 1.7* 2.1* 2.0* 1.1  AST 25 25 17 22   ALT 20 30 24 25   ALKPHOS 43 90 109 168*  PROT 4.6* 5.3* 6.5 7.1  ALBUMIN 1.8* 1.5* 1.7* 2.0*    Assessment and Plan:  47 y.o. female recent partial colectomy with LLQ colostomy, partial small bowel resection. Multiple post-op intra-abdominal abscesses s/p left lateral abdominal drain placement in IR 09/03/22   Drain Location: left lateral abdomen Size: Fr size: 10 Fr Date of placement: 7.12.24  Currently to: Drain collection device: gravity  24 hour output:  Output by Drain (mL) 09/04/22 0701 - 09/04/22 1900 09/04/22 1901 - 09/05/22 0700 09/05/22 0701 - 09/05/22 1900 09/05/22 1901 - 09/06/22 0700 09/06/22 0701 - 09/06/22 1302  Closed System Drain 1 Right Abdomen Bulb (JP) 19 Fr.    5   Closed System Drain Lateral LLQ Bulb (JP) 10 Fr. 30 20  40   Albumin 2.0, WBC is 15.7 (labs from 7.14.24. Cultures grew e. coli  Interval imaging/drain  manipulation:  None since drain placement  Current examination: Flushes/aspirates easily.  Insertion site unremarkable. Suture and stat lock in place. Dressed appropriately.   Plan: Unable to flush or aspirate drain. Case discussed with IR Attending Dr. Vella Redhead who recommends CT abd pelvis with and without contrast for further evaluation of fluid collection.  Continue TID flushes with  5 cc NS. Record output Q shift. Dressing changes QD or PRN if soiled.  Call IR APP or on call IR MD if difficulty flushing or sudden change in drain output.  Repeat imaging/possible drain injection once output < 10 mL/QD (excluding flush material). Consideration for drain removal if output is < 10 mL/QD (excluding flush material), pending discussion with the providing surgical service.  Discharge planning: Please contact IR APP or on call IR MD prior to patient d/c to ensure appropriate follow up plans are in place. Typically patient will follow up with IR clinic 10-14 days post d/c for repeat imaging/possible drain injection. IR scheduler will contact patient with date/time of appointment. Patient will need to flush drain QD with 5 cc NS, record output QD, dressing changes every 2-3 days or earlier if soiled.   IR will continue to follow - please call with questions or concerns.   Electronically Signed: Alene Mires, NP 09/06/2022, 12:51 PM   I spent a total of 15 Minutes at the patient's bedside AND on the patient's hospital floor or unit, greater than 50% of which was counseling/coordinating care for left a

## 2022-09-06 NOTE — Progress Notes (Signed)
PHARMACY - TOTAL PARENTERAL NUTRITION CONSULT NOTE   Indication: Prolonged NPO status   Patient Measurements: Height: 5\' 3"  (160 cm) Weight: 97.9 kg (215 lb 13.3 oz) IBW/kg (Calculated) : 52.4 TPN AdjBW (KG): 60.6 Body mass index is 38.23 kg/m. Usual Weight: 85 kg  Assessment:   47 y.o. F with history of a known pelvic mass that is follow by Dr. Pricilla Holm with [facility].  She underwent attempted resection in 2022 but given this was so adhered to multiple surrounding structures, it was unable to be removed. Biopsies were obtained at this time and this was not found to be malignant. Her last MRI revealed this was smaller in size. She reports being in her normal state of health until Saturday prior to admission when she developed an acute onset of pelvic cramping and pain. Admitted for septic shock, started on Zosyn. Leukocytosis now resolved, improved hemodynamics. Renal function improving. Anticipated prolonged NPO status per Surgery. Pharmacy consulted to initiate TPN.   Patient tolerated clear liquid diet and NGT clamping trial on 7/14 - d/w CCS and plan to advance further to full liquid diet, add Ensure and reassess 7/16. Pt's mother to also bring in protein shakes.    Glucose / Insulin: no hx DM, CBGs <180, off SSI Electrolytes: Na 134 (max in TPN, will receive ~10 mEq from each Unasyn dose), CoCa 10.1, Cl slightly low 97, others wnl Renal: Scr 0.67 (bsl <1), BUN wnl Hepatic: LFTs wnl, alk phos 168, tbili 1.1, Alb 2, Hx hypertriglyceridemia TG down to 186 Intake / Output; MIVF: UOP 0.2 ml/kg/hr, NGT , drains 45 mL, colostomy , Net + 4.7L , LBM 7/14 GI Imaging: 7/2 CT abd: Pneumomediastinum extending into the lower neck. There is pneumoperitoneum in the upper abdomen suggesting possible bowel perforation. Fatty liver. 7/12 CT: peritonitis and multiple intra-abd abscesses, reactive retroperitoneal lymph nodes GI Surgeries / Procedures:  7/2 OR for ex-lap, area of small bowel resection  - left in discontinuity with open abdomen 7/4  OR for washout, hartmann's procedure, SB re-anastomosis, abdominal closure  7/12 IR Drain placed  7/15 NGT removed Central access: 7/3 PICC  TPN start date: 7/4    Nutritional Goals: Goal TPN rate is 85 mL/hr (provides 118g protein and 1954 kcals)  RD Assessment: Estimated Needs Total Energy Estimated Needs: 1900-2100 Total Protein Estimated Needs: 105-125g Total Fluid Estimated Needs: >/=1.9L  Current Nutrition:  Full liquids, TPN, and Oral supplements (Ensure + outside protein shakes)   Plan:  Continue TPN at goal 85 mL/hr at 1800, meeting 100% estimated needs; incr lipid content to meet ~26% estimated needs as TG's improved - can increase further if TPN is to continue or as TG's further improve Electrolytes in TPN: Na 150 mEq/L, K 30 mEq/L, Ca 5 mEq/L, Mg 14 mEq/L, decr Phos to 12 mmol/L. Adj Cl:Ac 2:1 Continue standard MVI and trace elements to TPN Monitor TPN labs on Mon/Thurs, daily as needed  F/u FLD tolerability and PO intake - hopeful to wean TPN 7/16   Thank you for allowing pharmacy to be a part of this patient's care.  Rexford Maus, PharmD, BCPS 09/06/2022 8:18 AM

## 2022-09-06 NOTE — Progress Notes (Signed)
Physical Therapy Treatment Patient Details Name: Brittney Herrera MRN: [id number] DOB: 1975-04-21 Today's Date: 09/06/2022   History of Present Illness 47 y/o F presented 08/24/22 with acute onset of pelvic cramping and pain. Found to have free air in her pelvis and mediastinum likely secondary to perforated colon. 7/02 exp lap finding large retroperitoneal abscess and underwent small bowel resection, placed pelvic drain, and VAC dressing. Remained sedated on vent post-op. Return to OR 08/26/22 for small bowel resection, drainage intra-abdominal abscess, colostomy. Extubated 7/05;  PMH anxiety, hypothyroidism, RA (not on steroids or biologics), who has had a known pelvic mass that is follow by Brittney Herrera with [facility].  She underwent attempted resection in 2022 but given this was so adhered to multiple surrounding structures, it was unable to be removed.    PT Comments  Continuing work on functional mobility and activity tolerance;  Session focused on progressive ambulation with nice progress in gait distance; encouraged pt to continue walking efforts in hallway with family     Assistance Recommended at Discharge Set up Supervision/Assistance  If plan is discharge home, recommend the following:  Can travel by private vehicle    A lot of help with bathing/dressing/bathroom;Assistance with cooking/housework;Direct supervision/assist for medications management;Direct supervision/assist for financial management;Assist for transportation;Help with stairs or ramp for entrance;A little help with walking and/or transfers      Equipment Recommendations  Rolling walker (2 wheels)    Recommendations for Other Services       Precautions / Restrictions Precautions Precautions: Fall Precaution Comments: new colostomy, JP drain R abd and L abd, PICC RUE     Mobility  Bed Mobility Overal bed mobility: Needs Assistance Bed Mobility: Rolling Rolling: Min guard       Sit to sidelying: Min  guard General bed mobility comments: HOB raised; Assist mostly for line management; pt performs a good bridge for positioning of pads and pillows    Transfers Overall transfer level: Needs assistance Equipment used: Rolling walker (2 wheels) Transfers: Sit to/from Stand Sit to Stand: Min assist           General transfer comment: Grimace with effort/discomfort, but good control of stand to sit    Ambulation/Gait Ambulation/Gait assistance: Min guard Gait Distance (Feet): 175 Feet Assistive device: Rolling walker (2 wheels) Gait Pattern/deviations: Step-through pattern, Decreased stride length, Trunk flexed       General Gait Details: Very slow cadence, but nice progression of distance   Stairs             Wheelchair Mobility     Tilt Bed    Modified Rankin (Stroke Patients Only)       Balance Overall balance assessment: Needs assistance Sitting-balance support: Bilateral upper extremity supported, Feet supported       Standing balance support: Bilateral upper extremity supported, During functional activity, Reliant on assistive device for balance Standing balance-Leahy Scale: Fair                              Cognition Arousal/Alertness: Awake/alert Behavior During Therapy: Anxious Overall Cognitive Status: Within Functional Limits for tasks assessed                                          Exercises      General Comments General comments (skin integrity, edema, etc.): pt with 2x JP  drains secured to gown, management of lines in center of walker for wound vac.      Pertinent Vitals/Pain Pain Assessment Pain Assessment: Faces Pain Location: abdomen and back Pain Descriptors / Indicators: Guarding Pain Intervention(s): Monitored during session, Patient requesting pain meds-RN notified    Home Living                          Prior Function            PT Goals (current goals can now be found in the  care plan section) Acute Rehab PT Goals Patient Stated Goal: return home PT Goal Formulation: With patient Time For Goal Achievement: 09/11/22 Potential to Achieve Goals: Good Progress towards PT goals: Progressing toward goals    Frequency    Min 4X/week      PT Plan Current plan remains appropriate;Frequency needs to be updated    Co-evaluation              AM-PAC PT "6 Clicks" Mobility   Outcome Measure  Help needed turning from your back to your side while in a flat bed without using bedrails?: A Little Help needed moving from lying on your back to sitting on the side of a flat bed without using bedrails?: A Lot Help needed moving to and from a bed to a chair (including a wheelchair)?: A Little Help needed standing up from a chair using your arms (e.g., wheelchair or bedside chair)?: A Little Help needed to walk in hospital room?: A Little Help needed climbing 3-5 steps with a railing? : A Lot 6 Click Score: 16    End of Session Equipment Utilized During Treatment: Other (comment) (defer gait belt given ostomy and multiple lines) Activity Tolerance: Patient tolerated treatment well Patient left: in bed;with call bell/phone within reach Nurse Communication: Mobility status;Patient requests pain meds PT Visit Diagnosis: Other abnormalities of gait and mobility (R26.89);Pain Pain - part of body:  (abdomen)     Time: 1610-9604 PT Time Calculation (min) (ACUTE ONLY): 40 min  Charges:    $Gait Training: 23-37 mins $Therapeutic Activity: 8-22 mins PT General Charges $$ ACUTE PT VISIT: 1 Visit                     Brittney Herrera, PT  Acute Rehabilitation Services Office [phone] Secure Chat welcomed    Brittney Herrera 09/06/2022, 3:11 PM

## 2022-09-06 NOTE — Progress Notes (Signed)
Occupational Therapy Treatment Patient Details Name: Brittney Herrera MRN: [id number] DOB: 1975-10-07 Today's Date: 09/06/2022   History of present illness 47 y/o F presented 08/24/22 with acute onset of pelvic cramping and pain. Found to have free air in her pelvis and mediastinum likely secondary to perforated colon. 7/02 exp lap finding large retroperitoneal abscess and underwent small bowel resection, placed pelvic drain, and VAC dressing. Remained sedated on vent post-op. Return to OR 08/26/22 for small bowel resection, drainage intra-abdominal abscess, colostomy. Extubated 7/05;  PMH anxiety, hypothyroidism, RA (not on steroids or biologics), who has had a known pelvic mass that is follow by Dr. Pricilla Holm with [facility].  She underwent attempted resection in 2022 but given this was so adhered to multiple surrounding structures, it was unable to be removed.   OT comments  Pt demonstrates bed mobility, toilet transfer, and LB dressing for mesh underwear this session with modification to walk. OT ending session with start of PT session for ambulation in the hallway. Education on ostomy bag started this session as related to adls. Pt could benefit from AE for donning of socks and clothing management. Recommendation for HHOT.    Recommendations for follow up therapy are one component of a multi-disciplinary discharge planning process, led by the attending physician.  Recommendations may be updated based on patient status, additional functional criteria and insurance authorization.    Assistance Recommended at Discharge Set up Supervision/Assistance  Patient can return home with the following  A lot of help with bathing/dressing/bathroom;Assistance with cooking/housework;Assist for transportation;Help with stairs or ramp for entrance   Equipment Recommendations  None recommended by OT    Recommendations for Other Services      Precautions / Restrictions Precautions Precautions: Fall Precaution  Comments: new colostomy, JP drain R abd and L abd, PICC RUE       Mobility Bed Mobility Overal bed mobility: Needs Assistance Bed Mobility: Rolling Rolling: Min assist Sidelying to sit: Min assist       General bed mobility comments: HOB less 30 degrees and pulling with R UE on therapist. pt cued to use rail but reaching for therapist.    Transfers Overall transfer level: Needs assistance Equipment used: Rolling walker (2 wheels) Transfers: Sit to/from Stand Sit to Stand: Min assist           General transfer comment: widen base of support past shoulder to power up into standing. once standing bringing bil le to shoulder width     Balance Overall balance assessment: Needs assistance Sitting-balance support: Bilateral upper extremity supported, Feet supported       Standing balance support: Bilateral upper extremity supported, During functional activity, Reliant on assistive device for balance Standing balance-Leahy Scale: Fair                             ADL either performed or assessed with clinical judgement   ADL Overall ADL's : Needs assistance/impaired                     Lower Body Dressing: Maximal assistance Lower Body Dressing Details (indicate cue type and reason): don socks. pt lifting foot and reports i can't and insist OT place them. Next session recommend use of AE with education prior to transfer. Toilet Transfer: Minimal assistance;Rolling walker (2 wheels);Regular Toilet;Ambulation Toilet Transfer Details (indicate cue type and reason): discussed seated position with ostomy kit near by with all needed supplies, ostomy should not have  an odor when properly adhered to skin, making sure to place towel in lap for any spillage. pt has a single flap ostomy for closure. pt reports 09/06/22 being the first day of education and empty of her ostomy with WOC. Toileting- Clothing Manipulation and Hygiene: Minimal assistance Toileting - Clothing  Manipulation Details (indicate cue type and reason): pulling up panties needed help     Functional mobility during ADLs: Min guard;Rolling walker (2 wheels)      Extremity/Trunk Assessment Upper Extremity Assessment Upper Extremity Assessment: Overall WFL for tasks assessed   Lower Extremity Assessment Lower Extremity Assessment: Defer to PT evaluation        Vision   Additional Comments: wears glasses and at the end of session chooses to take glasses off to start PT session. pt reports glasses are loses and slide on her face. she can see in a 4 ft radius around her without them   Perception     Praxis      Cognition Arousal/Alertness: Awake/alert Behavior During Therapy: Anxious Overall Cognitive Status: Within Functional Limits for tasks assessed                                          Exercises      Shoulder Instructions       General Comments pt with 2x JP drains secured to gown, management of lines in center of walker for wound vac.    Pertinent Vitals/ Pain       Pain Assessment Pain Assessment: Faces Faces Pain Scale: Hurts a little bit Pain Location: abdomen and back Pain Descriptors / Indicators: Guarding Pain Intervention(s): Monitored during session, Premedicated before session, Repositioned, Patient requesting pain meds-RN notified  Home Living                                          Prior Functioning/Environment              Frequency  Min 2X/week        Progress Toward Goals  OT Goals(current goals can now be found in the care plan section)  Progress towards OT goals: Progressing toward goals  Acute Rehab OT Goals Patient Stated Goal: to stop sweating so much- figure out what medication is causing this OT Goal Formulation: With patient/family Time For Goal Achievement: 09/13/22 Potential to Achieve Goals: Good ADL Goals Pt Will Perform Grooming: with supervision;with set-up;sitting Pt Will  Perform Upper Body Bathing: with min assist;sitting Pt Will Perform Lower Body Bathing: with max assist;sitting/lateral leans;with adaptive equipment Pt Will Perform Upper Body Dressing: sitting;with min assist Pt Will Perform Lower Body Dressing: with mod assist;with adaptive equipment Pt Will Transfer to Toilet: with min guard assist;ambulating Pt Will Perform Toileting - Clothing Manipulation and hygiene: with min guard assist;sit to/from stand (updated)  Plan Discharge plan remains appropriate    Co-evaluation                 AM-PAC OT "6 Clicks" Daily Activity     Outcome Measure   Help from another person eating meals?: None Help from another person taking care of personal grooming?: A Little Help from another person toileting, which includes using toliet, bedpan, or urinal?: A Lot Help from another person bathing (including washing, rinsing, drying)?: A Lot Help from  another person to put on and taking off regular upper body clothing?: A Lot Help from another person to put on and taking off regular lower body clothing?: Total 6 Click Score: 14    End of Session Equipment Utilized During Treatment: Rolling walker (2 wheels)  OT Visit Diagnosis: Other abnormalities of gait and mobility (R26.89);Pain;Muscle weakness (generalized) (M62.81)   Activity Tolerance Patient tolerated treatment well   Patient Left Other (comment) (started PT session)   Nurse Communication Mobility status;Precautions        Time: 7829-5621 OT Time Calculation (min): 17 min  Charges: OT General Charges $OT Visit: 1 Visit OT Treatments $Self Care/Home Management : 8-22 mins   Brynn, OTR/L  Acute Rehabilitation Services Office: [phone] .   Mateo Flow 09/06/2022, 2:10 PM

## 2022-09-06 NOTE — Progress Notes (Signed)
NG tube removed per order.  Pt tolerated well.

## 2022-09-07 ENCOUNTER — Inpatient Hospital Stay (HOSPITAL_COMMUNITY): Payer: BC Managed Care – PPO

## 2022-09-07 LAB — CBC
HCT: 24.8 % — ABNORMAL LOW (ref 36.0–46.0)
Hemoglobin: 7.5 g/dL — ABNORMAL LOW (ref 12.0–15.0)
MCH: 31.9 pg (ref 26.0–34.0)
MCHC: 30.2 g/dL (ref 30.0–36.0)
MCV: 105.5 fL — ABNORMAL HIGH (ref 80.0–100.0)
Platelets: 950 10*3/uL (ref 150–400)
RBC: 2.35 MIL/uL — ABNORMAL LOW (ref 3.87–5.11)
RDW: 16.8 % — ABNORMAL HIGH (ref 11.5–15.5)
WBC: 9.9 10*3/uL (ref 4.0–10.5)
nRBC: 0 % (ref 0.0–0.2)

## 2022-09-07 MED ORDER — IOHEXOL 350 MG/ML SOLN
75.0000 mL | Freq: Once | INTRAVENOUS | Status: AC | PRN
  Administered 2022-09-07: 75 mL via INTRAVENOUS

## 2022-09-07 MED ORDER — TRAVASOL 10 % IV SOLN
INTRAVENOUS | Status: DC
  Filled 2022-09-07: qty 556.8

## 2022-09-07 MED ORDER — HYDROMORPHONE HCL 1 MG/ML IJ SOLN: 1.0000 mg | INTRAMUSCULAR | Status: DC | PRN

## 2022-09-07 NOTE — Progress Notes (Signed)
Assessment & Plan: HD#15 Pneumoperitoneum, retroperitoneal air, mediastinal air likely secondary to perforated rectosigmoid colon with history of pelvic mass  7/2: ex lap extensive LOA, SBR in discontinuity, placement of pelvic drain, placement of ABThera vacuum dressing - Dr. Corliss Skains   7/4: ex lap, hartmann's procedure,  washout, small bowel anastomosis (re-establish continuity), drainage LUQ IAA, abdominal closure - Dr. Cliffton Asters   - WBC 9.9, normalized today - CT w/ multiple fluid collectons, IR drainage performed 7/12 on left - serous output, Cx w/ E. Coli (pansensitive), looks like IR plans drain study today, will follow results. - advance to soft diet, plan to stop TPN. - VAC change M/W/F by RN staff.  - Multimodal pain control: 1,000 mg tylenol q 6h, advil 600 mg TID, robaxin 1,000 mg QID, oxy 10 mg q 4h PRN, HM for breakthrough - ID: Zosyn,, narrow to Unasyn 7/15 based on Cx from 7/12  - VTE: lovenox, SCDs - Dispo: inpatient, med-surg floor  Advance to soft diet, follow up drain studies. Patient progressing with ostomy education. [facility] RN ordered. Possible discharge 24-48 hours.        Hosie Spangle, [facility] practice Office: [phone]        Chief Complaint: Perforated viscus  Subjective: Overall doing better. Frustrated this morning about pain control. Her pouch leaked last night which is unfortunate.   Objective: Vital signs in last 24 hours: Temp:  [98.2 F (36.8 C)-98.3 F (36.8 C)] 98.3 F (36.8 C) (07/16 0541) Pulse Rate:  [81-86] 81 (07/16 0541) Resp:  [16-17] 16 (07/16 0541) BP: (116-119)/(78-82) 116/80 (07/16 0541) SpO2:  [96 %-99 %] 99 % (07/16 0541) Weight:  [97.9 kg] 97.9 kg (07/16 0500) Last BM Date : 09/06/22  Intake/Output from previous day: 07/15 0701 - 07/16 0700 In: 3252.4 [P.O.:120; I.V.:2832.4; IV Piggyback:300.1] Out: 545 [Urine:400; Drains:45; Stool:100] Intake/Output this shift: No intake/output data  recorded.  Physical Exam: HEENT - sclerae clear, mucous membranes moist Neck - soft Abdomen - soft, stoma viable - gas and stool in pouch, VAC dressing intact Ext - no edema, non-tender Neuro - alert & oriented, no focal deficits  Lab Results:  Recent Labs    09/05/22 0334 09/07/22 0310  WBC 15.7* 9.9  HGB 8.0* 7.5*  HCT 24.9* 24.8*  PLT 1,011* 950*   BMET Recent Labs    09/06/22 0332  NA 134*  K 4.3  CL 97*  CO2 25  GLUCOSE 116*  BUN 13  CREATININE 0.67  CALCIUM 8.5*   PT/INR No results for input(s): "LABPROT", "INR" in the last 72 hours.  Comprehensive Metabolic Panel:    Component Value Date/Time   NA 134 (L) 09/06/2022 0332   NA 131 (L) 09/04/2022 0300   K 4.3 09/06/2022 0332   K 3.9 09/04/2022 0300   CL 97 (L) 09/06/2022 0332   CL 100 09/04/2022 0300   CO2 25 09/06/2022 0332   CO2 24 09/04/2022 0300   BUN 13 09/06/2022 0332   BUN 12 09/04/2022 0300   CREATININE 0.67 09/06/2022 0332   CREATININE 0.67 09/04/2022 0300   GLUCOSE 116 (H) 09/06/2022 0332   GLUCOSE 124 (H) 09/04/2022 0300   CALCIUM 8.5 (L) 09/06/2022 0332   CALCIUM 7.8 (L) 09/04/2022 0300   AST 22 09/06/2022 0332   AST 17 09/02/2022 0321   ALT 25 09/06/2022 0332   ALT 24 09/02/2022 0321   ALKPHOS 168 (H) 09/06/2022 0332   ALKPHOS 109 09/02/2022 0321   BILITOT 1.1  09/06/2022 0332   BILITOT 2.0 (H) 09/02/2022 0321   PROT 7.1 09/06/2022 0332   PROT 6.5 09/02/2022 0321   ALBUMIN 2.0 (L) 09/06/2022 0332   ALBUMIN 1.7 (L) 09/02/2022 0321    Studies/Results: No results found.    Francine Graven Dante Roudebush 09/07/2022  Patient ID: Willaim Rayas, female   DOB: April 11, 1975, 47 y.o.   MRN: [id number]

## 2022-09-07 NOTE — Progress Notes (Signed)
Changed the negative pressure therapy cannister and hooked back up to suction at 125. 500cc in old cannister. Applied reinforcement draping on the colostomy side of the dressing where there was a leak and now the therapy is working properly. Emptied pouch of 100 cc, instructed pt on using toilet paper "wicks" or "cigars" while cleaning the end of the pouch.

## 2022-09-07 NOTE — Progress Notes (Signed)
Pt transported to MRI via bed by transportation staff.  

## 2022-09-07 NOTE — Progress Notes (Signed)
PHARMACY - TOTAL PARENTERAL NUTRITION CONSULT NOTE   Indication: Prolonged NPO status   Patient Measurements: Height: 5\' 3"  (160 cm) Weight: 97.9 kg (215 lb 13.3 oz) IBW/kg (Calculated) : 52.4 TPN AdjBW (KG): 60.6 Body mass index is 38.23 kg/m. Usual Weight: 85 kg  Assessment:   47 y.o. F with history of a known pelvic mass that is follow by Dr. Pricilla Holm with [facility].  She underwent attempted resection in 2022 but given this was so adhered to multiple surrounding structures, it was unable to be removed. Biopsies were obtained at this time and this was not found to be malignant. Her last MRI revealed this was smaller in size. She reports being in her normal state of health until Saturday prior to admission when she developed an acute onset of pelvic cramping and pain. Admitted for septic shock, started on Zosyn. Leukocytosis now resolved, improved hemodynamics. Renal function improving. Anticipated prolonged NPO status per Surgery. Pharmacy consulted to initiate TPN.    Glucose / Insulin: no hx DM, CBGs <180, off SSI Electrolytes: Labs from 7/15: Na 134 (max in TPN, will receive ~10 mEq from each Unasyn dose), CoCa 10.1, Cl slightly low 97, others wnl Renal: Scr 0.67 (bsl <1), BUN wnl Hepatic: LFTs wnl, alk phos 168, tbili 1.1, Alb 2, Hx hypertriglyceridemia TG down to 186 Intake / Output; MIVF: UOP 0.2 ml/kg/hr, NGT , drains 45 mL, colostomy , Net + 4.7L , LBM 7/14 GI Imaging: 7/2 CT abd: Pneumomediastinum extending into the lower neck. There is pneumoperitoneum in the upper abdomen suggesting possible bowel perforation. Fatty liver. 7/12 CT: peritonitis and multiple intra-abd abscesses, reactive retroperitoneal lymph nodes GI Surgeries / Procedures:  7/2 OR for ex-lap, area of small bowel resection - left in discontinuity with open abdomen 7/4  OR for washout, hartmann's procedure, SB re-anastomosis, abdominal closure  7/12 IR Drain placed  7/15 NGT removed Central access: 7/3  PICC  TPN start date: 7/4    Nutritional Goals: Goal TPN rate is 85 mL/hr (provides 118g protein and 1954 kcals)  RD Assessment: Estimated Needs Total Energy Estimated Needs: 1900-2100 Total Protein Estimated Needs: 105-125g Total Fluid Estimated Needs: >/=1.9L  Current Nutrition:  Adv to Soft diet, TPN, and Oral supplements (Ensure + outside protein shakes)   Plan:  Half TPN to 40 mL/hr at 1800, meeting ~50% estimated needs per discussion w/ CCS with plans to stop completely on 7/17 (planning for discharge over next day or so) Electrolytes in TPN >> overall amounts decreased with reduction in TPN rate; Na 150 mEq/L, K 30 mEq/L, Ca 5 mEq/L, Mg 14 mEq/L, Phos 12 mmol/L. Cl:Ac 2:1 Continue standard MVI and trace elements to TPN Monitor TPN labs on Mon/Thurs, daily as needed   Thank you for allowing pharmacy to be a part of this patient's care.  Rexford Maus, PharmD, BCPS 09/07/2022 7:37 AM

## 2022-09-07 NOTE — TOC Progression Note (Addendum)
Transition of Care [facility]) - Progression Note    Patient Details  Name: Brittney Herrera MRN: [id number] Date of Birth: 08/29/1975  Transition of Care [facility]) CM/SW Contact  Nadene Rubins Adria Devon, RN Phone Number: 09/07/2022, 12:40 PM  Clinical Narrative:     Per chart discharge 1 to 2 days.   Home health RN was arranged last week with [facility] . Updated [name] with [facility]   Also updated [place] Ana with [facility] , [facility] has been approved.   Discussed above with patient and husband at bedside. Confirmed with patient and husband ostomy and drain teaching has been done.   Home VAC at bedside and charging. Walker for home at bedside. [facility] can see for [facility] dressing change Friday 09/10/22   Expected Discharge Plan: Home w Home Health Services Barriers to Discharge: Continued Medical Work up  Expected Discharge Plan and Services   Discharge Planning Services: CM Consult Post Acute Care Choice: Home Health, Durable Medical Equipment Living arrangements for the past 2 months: Single Family Home                 DME Arranged: Walker rolling         HH Arranged: RN, PT HH Agency: [facility] Date [facility] Agency Contacted: 08/31/22 Time HH Agency Contacted: 1146 Representative spoke with at [facility] Agency: [name]   Social Determinants of Health (SDOH) Interventions SDOH Screenings   Food Insecurity: No Food Insecurity (08/25/2022)  Housing: Low Risk  (08/25/2022)  Transportation Needs: No Transportation Needs (08/25/2022)  Utilities: Not At Risk (08/25/2022)  Tobacco Use: Medium Risk (08/24/2022)    Readmission Risk Interventions     No data to display

## 2022-09-07 NOTE — Consult Note (Signed)
   [facility] CM Inpatient Consult   09/07/2022  Brittney Herrera 11-05-1975 [id number]  [facility] [[facility]]  Accountable Care Organization [ACO] Patient: Brittney Herrera  Primary Care Provider:  Linus Galas, NP with [facility]   Patient screened for hospitalization with noted medium risk score for unplanned readmission risk 14 day length of stay and  to assess for potential [facility]  [[facility]] Care Management service needs for post hospital transition for care coordination.  Review of patient's electronic medical record reveals patient is currently for home with [facility] s/p Exp Lap for home with [facility].  Plan: No current Community Care Coordination needs assessed.  Of note, [facility] Care Management/Population Health does not replace or interfere with any arrangements made by the Inpatient Transition of Care team.  For questions contact:   Charlesetta Shanks, RN BSN CCM [facility]  [phone] business mobile phone Toll free office [phone]  *Concierge Line  [phone] Fax number: [phone] [email or web address]

## 2022-09-08 ENCOUNTER — Other Ambulatory Visit (HOSPITAL_COMMUNITY): Payer: Self-pay

## 2022-09-08 LAB — AEROBIC/ANAEROBIC CULTURE W GRAM STAIN (SURGICAL/DEEP WOUND)

## 2022-09-08 MED ORDER — ALTEPLASE 2 MG IJ SOLR
2.0000 mg | Freq: Once | INTRAMUSCULAR | Status: DC
  Filled 2022-09-08: qty 2

## 2022-09-08 MED ORDER — IBUPROFEN 600 MG PO TABS
600.0000 mg | ORAL_TABLET | Freq: Three times a day (TID) | ORAL | 0 refills | Status: AC | PRN
  Filled 2022-09-08: qty 21, 7d supply, fill #0

## 2022-09-08 MED ORDER — AMOXICILLIN-POT CLAVULANATE 875-125 MG PO TABS
1.0000 | ORAL_TABLET | Freq: Two times a day (BID) | ORAL | 0 refills | Status: AC
Start: 2022-09-08 — End: 2022-09-15
  Filled 2022-09-08: qty 14, 7d supply, fill #0

## 2022-09-08 MED ORDER — METHOCARBAMOL 500 MG PO TABS
1000.0000 mg | ORAL_TABLET | Freq: Four times a day (QID) | ORAL | 1 refills | Status: DC | PRN
  Filled 2022-09-08: qty 80, 10d supply, fill #0

## 2022-09-08 MED ORDER — ACETAMINOPHEN 500 MG PO TABS
1000.0000 mg | ORAL_TABLET | Freq: Four times a day (QID) | ORAL | 0 refills | Status: AC | PRN
  Filled 2022-09-08: qty 100, 13d supply, fill #0

## 2022-09-08 MED ORDER — LORAZEPAM 0.5 MG PO TABS
0.5000 mg | ORAL_TABLET | Freq: Every day | ORAL | 0 refills | Status: AC | PRN
  Filled 2022-09-08: qty 15, 15d supply, fill #0

## 2022-09-08 MED ORDER — OXYCODONE HCL 10 MG PO TABS
10.0000 mg | ORAL_TABLET | ORAL | 0 refills | Status: DC | PRN
  Filled 2022-09-08: qty 30, 5d supply, fill #0

## 2022-09-08 NOTE — Consult Note (Signed)
WOC Nurse wound follow up Wound type:midline surgical, some fascial dehiscence noted today at proximal end.  12 o'clock  Surgical PA is present at bedside and observes.  Photo placed in chart Measurement: 26 cm x 8.4 cm x 3 cm  Wound bed: beefy red, increased granulation tissue in woundbed.    Drainage (amount, consistency, odor) moderate serosanguinous  no odor Periwound: LMQ colostomy Dressing procedure/placement/frequency:2 pieces black foam removed, 2 pieces black foam replaced.  Cleansed with NS.  Barrier ring to distal and lateral edge to promote seal. Covered with drape and seal achieved at 125 mmHg.  Change Mon.Wed.Fri.  Home unit in room.  Will be connected when discharge plans are solidified.  WOC Nurse ostomy follow up Stoma type/location:  colostomy Stomal assessment/size: 1 1/2" pink and moist  Peristomal assessment:  0.2 cm separation noted at 3 o'clock.  Covered with barrier ring Treatment options for stomal/peristomal skin: barrier ring and 2 piece pouch Output soft brown stool.  Lots of flatus.  We discuss a filtered pouch and burping the pouch by opening the plastic ring instead of the bottom of the pouch (emptying)  this will be easier when she is in bed, resting, etc and there is no need to empty, but just to release gas.   Ostomy pouching: 2pc. 2 3/4" with barrier ring  Education provided: We are going to be adding an abdominal binder today, hole will be cut for the ostomy pouch and I discuss this with the nurse Enrolled patient in [place] Secure Start Discharge program: Yes  kit is at the home I ordered 5 pouch sets and barrier rings via unit secretary for patient to take home  Patient is aware.  Will follow.  Anticipate discharge.  Mike Gip MSN, RN, FNP-BC CWON Wound, Ostomy, Continence Nurse Outpatient [facility] [phone] Pager [phone]

## 2022-09-08 NOTE — Progress Notes (Signed)
PHARMACY - TOTAL PARENTERAL NUTRITION CONSULT NOTE   Indication: Prolonged NPO status   Patient Measurements: Height: 5\' 3"  (160 cm) Weight: 97.9 kg (215 lb 13.3 oz) IBW/kg (Calculated) : 52.4 TPN AdjBW (KG): 60.6 Body mass index is 38.23 kg/m. Usual Weight: 85 kg  Assessment:   47 y.o. F with history of a known pelvic mass that is follow by Dr. Pricilla Holm with [facility].  She underwent attempted resection in 2022 but given this was so adhered to multiple surrounding structures, it was unable to be removed. Biopsies were obtained at this time and this was not found to be malignant. Her last MRI revealed this was smaller in size. She reports being in her normal state of health until Saturday prior to admission when she developed an acute onset of pelvic cramping and pain. Admitted for septic shock, started on Zosyn. Leukocytosis now resolved, improved hemodynamics. Renal function improving. Anticipated prolonged NPO status per Surgery. Pharmacy consulted to initiate TPN.    Glucose / Insulin: no hx DM, CBGs <180, off SSI Electrolytes: Labs from 7/15: Na 134 (max in TPN, will receive ~10 mEq from each Unasyn dose), CoCa 10.1, Cl slightly low 97, others wnl Renal: Scr 0.67 (bsl <1), BUN wnl Hepatic: LFTs wnl, alk phos 168, tbili 1.1, Alb 2, Hx hypertriglyceridemia TG down to 186 Intake / Output; MIVF: UOP 0.2 ml/kg/hr, NGT , drains 45 mL, colostomy , Net + 4.7L , LBM 7/14 GI Imaging: 7/2 CT abd: Pneumomediastinum extending into the lower neck. There is pneumoperitoneum in the upper abdomen suggesting possible bowel perforation. Fatty liver. 7/12 CT: peritonitis and multiple intra-abd abscesses, reactive retroperitoneal lymph nodes GI Surgeries / Procedures:  7/2 OR for ex-lap, area of small bowel resection - left in discontinuity with open abdomen 7/4  OR for washout, hartmann's procedure, SB re-anastomosis, abdominal closure  7/12 IR Drain placed  7/15 NGT removed Central access: 7/3  PICC  TPN start date: 7/4    Nutritional Goals: Goal TPN rate is 85 mL/hr (provides 118g protein and 1954 kcals)  RD Assessment: Estimated Needs Total Energy Estimated Needs: 1900-2100 Total Protein Estimated Needs: 105-125g Total Fluid Estimated Needs: >/=1.9L  Current Nutrition:  Adv to Soft diet, TPN, and Oral supplements (Ensure + outside protein shakes)   Plan:  Discussed w/ CCS - plan to stop TPN today after current bag so will not order additional TPN Discontinue associated TPN labs/orders  Thank you for allowing pharmacy to be a part of this patient's care.  Rexford Maus, PharmD, BCPS 09/08/2022 10:11 AM

## 2022-09-08 NOTE — TOC Progression Note (Signed)
Transition of Care (TOC) - Progression Note   Updated Brittney Herrera with [facility] on discharge today  Patient Details  Name: Brittney Herrera MRN: [id number] Date of Birth: 30-Apr-1975  Transition of Care [facility]) CM/SW Contact  Brittney Herrera, Brittney Devon, RN Phone Number: 09/08/2022, 4:18 PM  Clinical Narrative:       Expected Discharge Plan: Home w Home Health Services Barriers to Discharge: Continued Medical Work up  Expected Discharge Plan and Services   Discharge Planning Services: CM Consult Post Acute Care Choice: Home Health, Durable Medical Equipment Living arrangements for the past 2 months: Single Family Home Expected Discharge Date: 09/08/22               DME Arranged: Dan Humphreys rolling         HH Arranged: RN, PT HH Agency: [facility] Date [facility] Agency Contacted: 08/31/22 Time HH Agency Contacted: 1146 Representative spoke with at [facility] Agency: Brittney Herrera   Social Determinants of Health (SDOH) Interventions SDOH Screenings   Food Insecurity: No Food Insecurity (08/25/2022)  Housing: Low Risk  (08/25/2022)  Transportation Needs: No Transportation Needs (08/25/2022)  Utilities: Not At Risk (08/25/2022)  Tobacco Use: Medium Risk (08/24/2022)    Readmission Risk Interventions     No data to display

## 2022-09-08 NOTE — Progress Notes (Signed)
At bedside to instill tpa in PICC ports.  Pt states the doctor just left and she is going home today.  Will hold on tpa unless pt is to stay.  RN notified and TPA returned to [employer].

## 2022-09-08 NOTE — Progress Notes (Signed)
Referring Physician(s): Dr. Corliss Skains and Dr. Cliffton Asters  Supervising Physician: Simonne Come  Patient Status:  [facility] - In-pt  Chief Complaint: Recent partial colectomy with LLQ colostomy, partial small bowel resection. Multiple post-op intra-abdominal abscesses s/p left lateral abdominal drain placement in IR 09/03/22   Subjective: Patient resting comfortably in bed, her husband is in the room. Patient with plans for discharge home today. She denies any significant pain/discomfort.   Allergies: Prednisone and Tramadol  Medications: Prior to Admission medications   Medication Sig Start Date End Date Taking? Authorizing Provider  amoxicillin-clavulanate (AUGMENTIN) 875-125 MG tablet Take 1 tablet by mouth 2 (two) times daily for 7 days. 09/08/22 09/15/22 Yes Simaan, Francine Graven, PA-C  gabapentin (NEURONTIN) 300 MG capsule Take 300 mg by mouth daily as needed (rheumatoid arthritis).   Yes [provider]  ibuprofen (ADVIL) 800 MG tablet Take 800 mg by mouth every 8 (eight) hours as needed for moderate pain (rheumatoid arthritis flares).   Yes [provider]  levothyroxine (SYNTHROID) 150 MCG tablet Take 150 mcg by mouth daily before breakfast.   Yes [provider]  LORazepam (ATIVAN) 0.5 MG tablet Take 1 tablet (0.5 mg total) by mouth daily as needed for up to 15 days for anxiety. 09/08/22 09/23/22 Yes Simaan, Francine Graven, PA-C  oxyCODONE-acetaminophen (PERCOCET/ROXICET) 5-325 MG tablet Take 1 tablet by mouth every 6 (six) hours as needed for severe pain.   Yes [provider]  acetaminophen (TYLENOL) 500 MG tablet Take 2 tablets (1,000 mg total) by mouth every 6 (six) hours as needed. 09/08/22   Adam Phenix, PA-C  ibuprofen (ADVIL) 600 MG tablet Take 1 tablet (600 mg total) by mouth every 8 (eight) hours as needed for up to 7 days. 09/08/22 09/15/22  Adam Phenix, PA-C  methocarbamol (ROBAXIN) 500 MG tablet Take 2 tablets (1,000 mg total) by mouth every 6  (six) hours as needed for muscle spasms. 09/08/22   Adam Phenix, PA-C  Oxycodone HCl 10 MG TABS Take 1 tablet (10 mg total) by mouth every 4 (four) hours as needed for severe pain. 09/08/22   Adam Phenix, PA-C     Vital Signs: BP 127/87 (BP Location: Left Arm)   Pulse 81   Temp 98.3 F (36.8 C) (Oral)   Resp 17   Ht 5\' 3"  (1.6 m)   Wt 215 lb 13.3 oz (97.9 kg)   SpO2 100%   BMI 38.23 kg/m   Physical Exam Constitutional:      General: She is not in acute distress.    Appearance: She is not ill-appearing.  HENT:     Mouth/Throat:     Mouth: Mucous membranes are moist.     Pharynx: Oropharynx is clear.  Pulmonary:     Effort: Pulmonary effort is normal.  Abdominal:     Palpations: Abdomen is soft.     Tenderness: There is abdominal tenderness.     Comments: LUQ IR drain to suction. Approximately 25 ml of serous fluid in bulb. Large, bloody fibrin sheath in the bulb. Drain flushed with some resistance. Drain aspirated. Dressing is clean, dry and intact.   RUQ surgical drain to suction. Colostomy. Midline abdominal wound.   Skin:    General: Skin is warm and dry.  Neurological:     Mental Status: She is alert and oriented to person, place, and time.  Psychiatric:        Mood and Affect: Mood normal.  Behavior: Behavior normal.        Thought Content: Thought content normal.        Judgment: Judgment normal.     Imaging: CT ABDOMEN PELVIS W CONTRAST  Result Date: 09/07/2022 CLINICAL DATA:  Intra-abdominal abscess EXAM: CT ABDOMEN AND PELVIS WITH CONTRAST TECHNIQUE: Multidetector CT imaging of the abdomen and pelvis was performed using the standard protocol following bolus administration of intravenous contrast. RADIATION DOSE REDUCTION: This exam was performed according to the departmental dose-optimization program which includes automated exposure control, adjustment of the mA and/or kV according to patient size and/or use of iterative reconstruction  technique. CONTRAST:  75mL OMNIPAQUE IOHEXOL 350 MG/ML SOLN COMPARISON:  09/02/2022 FINDINGS: Lower chest: Focal areas of atelectasis or consolidation in both lung bases. Hepatobiliary: No focal liver abnormality is seen. No gallstones, gallbladder wall thickening, or biliary dilatation. Pancreas: Unremarkable. No pancreatic ductal dilatation or surrounding inflammatory changes. Spleen: Normal in size without focal abnormality. Adrenals/Urinary Tract: No adrenal gland nodules. Stone in the midportion left kidney measuring 3 mm diameter. No hydronephrosis or hydroureter. Nephrograms are symmetrical. 1 cm diameter low-attenuation lesion in the lower pole of the left kidney is too small to characterize but is unchanged since prior study dating back to 01/23/2021, likely a cyst. No imaging follow-up is indicated. Bladder is normal. Stomach/Bowel: Stomach, small bowel, and colon are mostly decompressed. Partial colectomy with Hartmann's pouch. Left lower quadrant diverting colostomy. Appendix is surgically absent. Vascular/Lymphatic: Normal caliber abdominal aorta. Periaortic and retroperitoneal lymphadenopathy with individual lymph nodes measuring up to about 1.3 cm short axis dimension. No change since prior study. Etiology is indeterminate. Reproductive: Uterus and bilateral adnexa are unremarkable. Other: Right lower quadrant drainage catheter extends through the pelvis. Previous pelvic collections are decreased without significant residual. Left flank drainage catheter with pigtail in the left pericolic gutter. Small residual collection around the catheter measuring 3.3 cm diameter. The collection is decreased since prior study. Midline skin defect consistent with surgical scar. Adjacent soft tissue infiltration with small anterior abdominal wall collection at about the level of the umbilicus. This collection measures about 2.6 cm in diameter. It is slightly smaller than on the prior study. Free fluid and pelvic  fluid or decreased. Diffuse stranding and edema throughout the mesentery. Musculoskeletal: No acute bony abnormalities. IMPRESSION: 1. Abdominal and pelvic collections are present as described with left flank and right lower quadrant drainage catheters in place. Collections are decreased in size since previous study. Anterior abdominal wall collection associated with the midline incision remains present but is slightly smaller than on the prior study. 2. No evidence of bowel obstruction. 3. Nonobstructing stone in the left kidney. 4. Patchy areas of consolidation in the lung bases may represent atelectasis or pneumonia. This is mildly progressed since prior study. 5. Retroperitoneal lymphadenopathy is unchanged since prior study. Electronically Signed   By: Burman Nieves M.D.   On: 09/07/2022 15:27    Labs:  CBC: Recent Labs    09/03/22 0227 09/04/22 0300 09/05/22 0334 09/07/22 0310  WBC 16.6* 16.7* 15.7* 9.9  HGB 7.6* 7.6* 8.0* 7.5*  HCT 24.3* 24.1* 24.9* 24.8*  PLT 756* 836* 1,011* 950*    COAGS: Recent Labs    09/03/22 1257  INR 1.2    BMP: Recent Labs    09/02/22 0321 09/03/22 0227 09/04/22 0300 09/06/22 0332  NA 132* 133* 131* 134*  K 4.5 4.0 3.9 4.3  CL 102 102 100 97*  CO2 23 25 24 25   GLUCOSE 181* 136* 124*  116*  BUN 12 11 12 13   CALCIUM 7.9* 7.9* 7.8* 8.5*  CREATININE 0.66 0.56 0.67 0.67  GFRNONAA >60 >60 >60 >60    LIVER FUNCTION TESTS: Recent Labs    08/27/22 0504 08/30/22 0428 09/02/22 0321 09/06/22 0332  BILITOT 1.7* 2.1* 2.0* 1.1  AST 25 25 17 22   ALT 20 30 24 25   ALKPHOS 43 90 109 168*  PROT 4.6* 5.3* 6.5 7.1  ALBUMIN 1.8* 1.5* 1.7* 2.0*    Assessment and Plan:  Recent partial colectomy with LLQ colostomy, partial small bowel resection. Multiple post-op intra-abdominal abscesses s/p left lateral abdominal drain placement in IR 09/03/22  CT abdomen/pelvis yesterday shows a 3.6 cm collection adjacent to the IR catheter. Imaging reviewed by  Dr. Deanne Coffer. I was able to flush (with some resistance) and aspirate the catheter. 50 ml output documented from last night and there was approximately 25 ml of serous fluid in bulb.   Patient scheduled for discharge today. IR drain will remain in place and she will follow up with [place] outpatient in 7-10 days. Drain care teaching/flushing instructions discussed and demonstrated to the patient and her husband. The patient was given enough saline flushes to last until her appointment with IR. She knows a scheduler will call her with a date/time and that she can call our office with any questions prior to her visit.   Drain Location: LUQ Size: Fr size: 10 Fr Date of placement: 09/03/22 Currently to: Drain collection device: suction bulb 24 hour output:  Output by Drain (mL) 09/06/22 0700 - 09/06/22 1459 09/06/22 1500 - 09/06/22 2259 09/06/22 2300 - 09/07/22 0659 09/07/22 0700 - 09/07/22 1459 09/07/22 1500 - 09/07/22 2259 09/07/22 2300 - 09/08/22 0659 09/08/22 0700 - 09/08/22 1413  Closed System Drain 1 Right Abdomen Bulb (JP) 19 Fr.   15      Closed System Drain Lateral LLQ Bulb (JP) 10 Fr.   30  50    Negative Pressure Wound Therapy Abdomen Medial    500       Interval imaging/drain manipulation:  CT abdomen/pelvis 09/08/22 IMPRESSION: 1. Abdominal and pelvic collections are present as described with left flank and right lower quadrant drainage catheters in place. Collections are decreased in size since previous study. Anterior abdominal wall collection associated with the midline incision remains present but is slightly smaller than on the prior study. 2. No evidence of bowel obstruction. 3. Nonobstructing stone in the left kidney. 4. Patchy areas of consolidation in the lung bases may represent atelectasis or pneumonia. This is mildly progressed since prior study. 5. Retroperitoneal lymphadenopathy is unchanged since prior study.  Electronically Signed: Alwyn Ren,  AGACNP-BC [phone] 09/08/2022, 2:09 PM   I spent a total of 15 Minutes at the the patient's bedside AND on the patient's hospital floor or unit, greater than 50% of which was counseling/coordinating care for intra-abdominal abscess drain.

## 2022-09-08 NOTE — Progress Notes (Signed)
Right JP drain changed removed per order. Patient tolerated well. Dry dressing applied.

## 2022-09-08 NOTE — Progress Notes (Signed)
Physical Therapy Treatment Patient Details Name: Brittney Herrera MRN: [id number] DOB: 1975-12-24 Today's Date: 09/08/2022   History of Present Illness 47 y/o F presented 08/24/22 with acute onset of pelvic cramping and pain. Found to have free air in her pelvis and mediastinum likely secondary to perforated colon. 7/02 exp lap finding large retroperitoneal abscess and underwent small bowel resection, placed pelvic drain, and VAC dressing. Remained sedated on vent post-op. Return to OR 08/26/22 for small bowel resection, drainage intra-abdominal abscess, colostomy. Extubated 7/05;  PMH anxiety, hypothyroidism, RA (not on steroids or biologics), who has had a known pelvic mass that is follow by Dr. Pricilla Holm with [facility].  She underwent attempted resection in 2022 but given this was so adhered to multiple surrounding structures, it was unable to be removed.    PT Comments  Continuing work on functional mobility and activity tolerance;  Session focused on bed mobiltiy, amb in prep for dc home; min cues overall for technqiue; pt is a good problem-solver, and has good help at home; Questions solicited and answered; OK for dc home from PT standpoint      Assistance Recommended at Discharge Set up Supervision/Assistance  If plan is discharge home, recommend the following:  Can travel by private vehicle    A lot of help with bathing/dressing/bathroom;Assistance with cooking/housework;Direct supervision/assist for medications management;Direct supervision/assist for financial management;Assist for transportation;Help with stairs or ramp for entrance;A little help with walking and/or transfers      Equipment Recommendations  Rolling walker (2 wheels)    Recommendations for Other Services       Precautions / Restrictions Precautions Precautions: Fall Precaution Comments: new colostomy, JP drain R abd and L abd, PICC RUE     Mobility  Bed Mobility Overal bed mobility: Needs Assistance Bed  Mobility: Rolling Rolling: Supervision Sidelying to sit: Min assist       General bed mobility comments: Min handheld assist to pull to sit    Transfers Overall transfer level: Needs assistance Equipment used: Rolling walker (2 wheels) Transfers: Sit to/from Stand Sit to Stand: Supervision           General transfer comment: Pulled up on RW with a little tipping noted, but stayed in control of RW; slwo descent to sit on toilet in bathroom with god use of grabbars    Ambulation/Gait Ambulation/Gait assistance: Supervision Gait Distance (Feet): 15 Feet Assistive device: Rolling walker (2 wheels) Gait Pattern/deviations: Step-through pattern, Decreased stride length, Trunk flexed       General Gait Details: Slow but steady   Stairs             Wheelchair Mobility     Tilt Bed    Modified Rankin (Stroke Patients Only)       Balance     Sitting balance-Leahy Scale: Good       Standing balance-Leahy Scale: Fair                              Cognition Arousal/Alertness: Awake/alert Behavior During Therapy: WFL for tasks assessed/performed Overall Cognitive Status: Within Functional Limits for tasks assessed                                 General Comments: pt agreeable to get OOB today        Exercises      General Comments General comments (skin integrity, edema,  etc.): Pt's husband present and helpful      Pertinent Vitals/Pain Pain Assessment Pain Assessment: Faces Faces Pain Scale: Hurts a little bit Pain Location: abdomen and back Pain Descriptors / Indicators: Guarding Pain Intervention(s): Monitored during session    Home Living                          Prior Function            PT Goals (current goals can now be found in the care plan section) Acute Rehab PT Goals Patient Stated Goal: return home PT Goal Formulation: With patient Time For Goal Achievement: 09/11/22 Potential to  Achieve Goals: Good Progress towards PT goals: Progressing toward goals    Frequency    Min 4X/week      PT Plan Current plan remains appropriate;Frequency needs to be updated    Co-evaluation              AM-PAC PT "6 Clicks" Mobility   Outcome Measure  Help needed turning from your back to your side while in a flat bed without using bedrails?: A Little Help needed moving from lying on your back to sitting on the side of a flat bed without using bedrails?: A Lot Help needed moving to and from a bed to a chair (including a wheelchair)?: A Little Help needed standing up from a chair using your arms (e.g., wheelchair or bedside chair)?: A Little Help needed to walk in hospital room?: A Little Help needed climbing 3-5 steps with a railing? : A Lot 6 Click Score: 16    End of Session Equipment Utilized During Treatment: Other (comment) (defer gait belt given ostomy and multiple lines) Activity Tolerance: Patient tolerated treatment well Patient left: with call bell/phone within reach (on toilet in bathroom) Nurse Communication: Mobility status PT Visit Diagnosis: Other abnormalities of gait and mobility (R26.89);Pain Pain - Right/Left:  (abdomen) Pain - part of body:  (abdomen)     Time: 6644-0347 PT Time Calculation (min) (ACUTE ONLY): 15 min  Charges:    $Gait Training: 8-22 mins PT General Charges $$ ACUTE PT VISIT: 1 Visit                     Van Clines, PT  Acute Rehabilitation Services Office [phone] Secure Chat welcomed    Levi Aland 09/08/2022, 3:37 PM

## 2022-09-08 NOTE — Progress Notes (Signed)
Discharge instructions (including medications) discussed with and copy provided to patient/caregiver  Patient left with her new portable wound vac attached.  Abdominal binder was cut to allow for patient ostomy bag and put on patient.

## 2022-09-09 ENCOUNTER — Other Ambulatory Visit: Payer: Self-pay | Admitting: General Surgery

## 2022-09-09 ENCOUNTER — Other Ambulatory Visit (HOSPITAL_COMMUNITY): Payer: Self-pay | Admitting: Student

## 2022-09-09 DIAGNOSIS — K631 Perforation of intestine (nontraumatic): Secondary | ICD-10-CM

## 2022-09-09 DIAGNOSIS — K651 Peritoneal abscess: Secondary | ICD-10-CM

## 2022-09-10 DIAGNOSIS — Z9181 History of falling: Secondary | ICD-10-CM | POA: Diagnosis not present

## 2022-09-10 DIAGNOSIS — K760 Fatty (change of) liver, not elsewhere classified: Secondary | ICD-10-CM | POA: Diagnosis not present

## 2022-09-10 DIAGNOSIS — F419 Anxiety disorder, unspecified: Secondary | ICD-10-CM | POA: Diagnosis not present

## 2022-09-10 DIAGNOSIS — Z87891 Personal history of nicotine dependence: Secondary | ICD-10-CM | POA: Diagnosis not present

## 2022-09-10 DIAGNOSIS — Z48815 Encounter for surgical aftercare following surgery on the digestive system: Secondary | ICD-10-CM | POA: Diagnosis not present

## 2022-09-10 DIAGNOSIS — M069 Rheumatoid arthritis, unspecified: Secondary | ICD-10-CM | POA: Diagnosis not present

## 2022-09-10 DIAGNOSIS — N200 Calculus of kidney: Secondary | ICD-10-CM | POA: Diagnosis not present

## 2022-09-10 DIAGNOSIS — E039 Hypothyroidism, unspecified: Secondary | ICD-10-CM | POA: Diagnosis not present

## 2022-09-10 DIAGNOSIS — Z433 Encounter for attention to colostomy: Secondary | ICD-10-CM | POA: Diagnosis not present

## 2022-09-10 DIAGNOSIS — E781 Pure hyperglyceridemia: Secondary | ICD-10-CM | POA: Diagnosis not present

## 2022-09-13 DIAGNOSIS — Z9181 History of falling: Secondary | ICD-10-CM | POA: Diagnosis not present

## 2022-09-13 DIAGNOSIS — N200 Calculus of kidney: Secondary | ICD-10-CM | POA: Diagnosis not present

## 2022-09-13 DIAGNOSIS — F419 Anxiety disorder, unspecified: Secondary | ICD-10-CM | POA: Diagnosis not present

## 2022-09-13 DIAGNOSIS — E039 Hypothyroidism, unspecified: Secondary | ICD-10-CM | POA: Diagnosis not present

## 2022-09-13 DIAGNOSIS — K760 Fatty (change of) liver, not elsewhere classified: Secondary | ICD-10-CM | POA: Diagnosis not present

## 2022-09-13 DIAGNOSIS — E781 Pure hyperglyceridemia: Secondary | ICD-10-CM | POA: Diagnosis not present

## 2022-09-13 DIAGNOSIS — Z87891 Personal history of nicotine dependence: Secondary | ICD-10-CM | POA: Diagnosis not present

## 2022-09-13 DIAGNOSIS — Z48815 Encounter for surgical aftercare following surgery on the digestive system: Secondary | ICD-10-CM | POA: Diagnosis not present

## 2022-09-13 DIAGNOSIS — M069 Rheumatoid arthritis, unspecified: Secondary | ICD-10-CM | POA: Diagnosis not present

## 2022-09-13 DIAGNOSIS — Z433 Encounter for attention to colostomy: Secondary | ICD-10-CM | POA: Diagnosis not present

## 2022-09-15 DIAGNOSIS — Z9181 History of falling: Secondary | ICD-10-CM | POA: Diagnosis not present

## 2022-09-15 DIAGNOSIS — E039 Hypothyroidism, unspecified: Secondary | ICD-10-CM | POA: Diagnosis not present

## 2022-09-15 DIAGNOSIS — E781 Pure hyperglyceridemia: Secondary | ICD-10-CM | POA: Diagnosis not present

## 2022-09-15 DIAGNOSIS — M069 Rheumatoid arthritis, unspecified: Secondary | ICD-10-CM | POA: Diagnosis not present

## 2022-09-15 DIAGNOSIS — F419 Anxiety disorder, unspecified: Secondary | ICD-10-CM | POA: Diagnosis not present

## 2022-09-15 DIAGNOSIS — Z87891 Personal history of nicotine dependence: Secondary | ICD-10-CM | POA: Diagnosis not present

## 2022-09-15 DIAGNOSIS — Z48815 Encounter for surgical aftercare following surgery on the digestive system: Secondary | ICD-10-CM | POA: Diagnosis not present

## 2022-09-15 DIAGNOSIS — K760 Fatty (change of) liver, not elsewhere classified: Secondary | ICD-10-CM | POA: Diagnosis not present

## 2022-09-15 DIAGNOSIS — N200 Calculus of kidney: Secondary | ICD-10-CM | POA: Diagnosis not present

## 2022-09-15 DIAGNOSIS — Z433 Encounter for attention to colostomy: Secondary | ICD-10-CM | POA: Diagnosis not present

## 2022-09-16 ENCOUNTER — Ambulatory Visit
Admission: RE | Admit: 2022-09-16 | Discharge: 2022-09-16 | Disposition: A | Discharge status: Home / Self Care | Payer: BC Managed Care – PPO | Source: Ambulatory Visit | Attending: Student | Admitting: Student

## 2022-09-16 ENCOUNTER — Ambulatory Visit
Admission: RE | Admit: 2022-09-16 | Discharge: 2022-09-16 | Disposition: A | Discharge status: Home / Self Care | Payer: BC Managed Care – PPO | Source: Ambulatory Visit | Attending: General Surgery | Admitting: General Surgery

## 2022-09-16 DIAGNOSIS — K651 Peritoneal abscess: Secondary | ICD-10-CM

## 2022-09-16 DIAGNOSIS — Z4803 Encounter for change or removal of drains: Secondary | ICD-10-CM | POA: Diagnosis not present

## 2022-09-16 DIAGNOSIS — K631 Perforation of intestine (nontraumatic): Secondary | ICD-10-CM

## 2022-09-16 HISTORY — PX: IR RADIOLOGIST EVAL & MGMT: IMG5224

## 2022-09-16 MED ORDER — IOPAMIDOL (ISOVUE-300) INJECTION 61%
100.0000 mL | Freq: Once | INTRAVENOUS | Status: AC | PRN
  Administered 2022-09-16: 100 mL via INTRAVENOUS

## 2022-09-17 DIAGNOSIS — K760 Fatty (change of) liver, not elsewhere classified: Secondary | ICD-10-CM | POA: Diagnosis not present

## 2022-09-17 DIAGNOSIS — E781 Pure hyperglyceridemia: Secondary | ICD-10-CM | POA: Diagnosis not present

## 2022-09-17 DIAGNOSIS — F419 Anxiety disorder, unspecified: Secondary | ICD-10-CM | POA: Diagnosis not present

## 2022-09-17 DIAGNOSIS — Z48815 Encounter for surgical aftercare following surgery on the digestive system: Secondary | ICD-10-CM | POA: Diagnosis not present

## 2022-09-17 DIAGNOSIS — N200 Calculus of kidney: Secondary | ICD-10-CM | POA: Diagnosis not present

## 2022-09-17 DIAGNOSIS — Z87891 Personal history of nicotine dependence: Secondary | ICD-10-CM | POA: Diagnosis not present

## 2022-09-17 DIAGNOSIS — Z9181 History of falling: Secondary | ICD-10-CM | POA: Diagnosis not present

## 2022-09-17 DIAGNOSIS — Z433 Encounter for attention to colostomy: Secondary | ICD-10-CM | POA: Diagnosis not present

## 2022-09-17 DIAGNOSIS — M069 Rheumatoid arthritis, unspecified: Secondary | ICD-10-CM | POA: Diagnosis not present

## 2022-09-17 DIAGNOSIS — E039 Hypothyroidism, unspecified: Secondary | ICD-10-CM | POA: Diagnosis not present

## 2022-09-20 DIAGNOSIS — K760 Fatty (change of) liver, not elsewhere classified: Secondary | ICD-10-CM | POA: Diagnosis not present

## 2022-09-20 DIAGNOSIS — M069 Rheumatoid arthritis, unspecified: Secondary | ICD-10-CM | POA: Diagnosis not present

## 2022-09-20 DIAGNOSIS — Z9181 History of falling: Secondary | ICD-10-CM | POA: Diagnosis not present

## 2022-09-20 DIAGNOSIS — Z433 Encounter for attention to colostomy: Secondary | ICD-10-CM | POA: Diagnosis not present

## 2022-09-20 DIAGNOSIS — Z48815 Encounter for surgical aftercare following surgery on the digestive system: Secondary | ICD-10-CM | POA: Diagnosis not present

## 2022-09-20 DIAGNOSIS — E781 Pure hyperglyceridemia: Secondary | ICD-10-CM | POA: Diagnosis not present

## 2022-09-20 DIAGNOSIS — Z87891 Personal history of nicotine dependence: Secondary | ICD-10-CM | POA: Diagnosis not present

## 2022-09-20 DIAGNOSIS — N200 Calculus of kidney: Secondary | ICD-10-CM | POA: Diagnosis not present

## 2022-09-20 DIAGNOSIS — F419 Anxiety disorder, unspecified: Secondary | ICD-10-CM | POA: Diagnosis not present

## 2022-09-20 DIAGNOSIS — E039 Hypothyroidism, unspecified: Secondary | ICD-10-CM | POA: Diagnosis not present

## 2022-09-22 DIAGNOSIS — Z87891 Personal history of nicotine dependence: Secondary | ICD-10-CM | POA: Diagnosis not present

## 2022-09-22 DIAGNOSIS — M069 Rheumatoid arthritis, unspecified: Secondary | ICD-10-CM | POA: Diagnosis not present

## 2022-09-22 DIAGNOSIS — Z433 Encounter for attention to colostomy: Secondary | ICD-10-CM | POA: Diagnosis not present

## 2022-09-22 DIAGNOSIS — F419 Anxiety disorder, unspecified: Secondary | ICD-10-CM | POA: Diagnosis not present

## 2022-09-22 DIAGNOSIS — E039 Hypothyroidism, unspecified: Secondary | ICD-10-CM | POA: Diagnosis not present

## 2022-09-22 DIAGNOSIS — N200 Calculus of kidney: Secondary | ICD-10-CM | POA: Diagnosis not present

## 2022-09-22 DIAGNOSIS — E781 Pure hyperglyceridemia: Secondary | ICD-10-CM | POA: Diagnosis not present

## 2022-09-22 DIAGNOSIS — K760 Fatty (change of) liver, not elsewhere classified: Secondary | ICD-10-CM | POA: Diagnosis not present

## 2022-09-22 DIAGNOSIS — Z48815 Encounter for surgical aftercare following surgery on the digestive system: Secondary | ICD-10-CM | POA: Diagnosis not present

## 2022-09-22 DIAGNOSIS — Z9181 History of falling: Secondary | ICD-10-CM | POA: Diagnosis not present

## 2022-09-23 NOTE — Discharge Summary (Signed)
[facility] Discharge Summary   Patient ID: Brittney Herrera MRN: [id number] DOB/AGE: Jul 05, 1975 47 y.o.  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  Procedures 08/24/2022 - Dr. Manus Rudd  exploratory laparotomy, extensive lysis of adhesions (over 2 hours), SBR, appendectomy, placement pelvic drain, placement ABThera negative pressure dressing.  08/26/2022  - Dr. Marin Olp Exploratory laparotomy with Hartmann's procedure (sigmoidectomy, end colostomy) Small bowel resection/anastomosis (re-establish continuity)  Drainage of intra-abdominal abscess - Left upper quadrant Takedown of splenic flexure Abdominal washout and closure   HPI: Brittney Herrera is a 47 y/o F with history of anxiety, hypothyroidism, RA (not on steroids or biologics), who has had a known pelvic mass that is follow by Dr. Pricilla Holm with [facility].  She underwent attempted resection in 2022 but given this was so adhered to multiple surrounding structures, it was unable to be removed.  She has to return several days later due to concern for a bowel  injury.  This was not identified.  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  Biopsies were obtained at this time and this was not found to be malignant.  This has been monitored routinely with Dr. Pricilla Holm.   Her last MRI revealed this was smaller in size.  She did undergo an extensive open incisional hernia repair with Bard mesh with a posterior rectus sheath release and B transversus abdominis release on the right as well by Dr. Dossie Der in November 2023.     The patient has been in her normal state of health until this past Saturday when she developed an acute onset of pelvic cramping and pain.  She then developed some nausea and vomiting about once a day.  She denies any hematemesis.  She admits to some diarrhea but states this is not completely abnormal.  She denies any blood in her stool.  She denies fevers or chills, but is very hot here.  Her pain has tracked up her left flank/side and into her chest.  She denies any history of diverticulitis or diverticulosis that she knows of.  She finally was having so much pain she presented to the [facility] ED today where she has been found to have free air in her pelvic, RTP, and in her mediastinum.  She does complain of some chest pain as well.  Her WBC is normal.  Her lactic acid is elevated at 2.7, now down 2.3. her BMET has several abnormalities but will be corrected with fluid repletion.  We have been asked to see her for further evaluation and recommendations.  Hospital Course:  Patient was taken emergently  to the operating room at [facility]  for the above operation on 7/2. Intra-operatively purulent ascites was noted along with small bowel dilatation and a large RP abscess near the rectosigmoid junction and the distal sigmoid and rectum were firm and fixed. No obvious perforation was able to be identified. Post-operatively she was  transferred to the ICU at [facility] and CCM was consulted for management of respiratory failure in the setting of septic shock with an open abdomen. She was taken back to the OR 7/4 for the above operation and abdominal closure. NG tube was in place to LIWS. She was extubated 7/5. She had a prolonged post-operative ileus requiring TPN. Psychiatry was consulted for acute stress response. And wound VAC was placed 7/8. CT abdomen pelvis was ordered 7/12 due to post-operative ileus and leukocytosis. CT significant for inra-abdominal abscess and IR was consulted and placed percutaneous drains. Ileus improved and diet was advanced as tolerated. Surgical drains and one IR drain was removed prior to discharge. On 09/08/22 the patients vitals were stable, pain controlled on PO meds, mobilizing, and felt stable for discharge home. She demonstrated drain care prior to discharge. She was discharged home on PO abx based on drain culture sensitivities. She was discharged home with wound VAC in place and home health RN.   Follow up as below  Physical Exam: General:  Alert, NAD, pleasant Abd:  Soft, ND, mild tenderness, incisions C/D/I, drain with serous   Allergies as of 09/08/2022       Reactions   Prednisone Swelling   Facial swelling    Tramadol Other (See Comments)   "Irritability"        Medication List     STOP taking these medications    ibuprofen 800 MG tablet Commonly known as: ADVIL   oxyCODONE-acetaminophen 5-325 MG tablet Commonly known as: PERCOCET/ROXICET       TAKE these medications    Acetaminophen Extra Strength 500 MG Tabs Take 2 tablets (1,000 mg total) by mouth every 6 (six) hours as needed.   gabapentin 300 MG capsule Commonly known as: NEURONTIN Take 300 mg by mouth daily as needed (rheumatoid arthritis).   levothyroxine 150 MCG tablet Commonly known as: SYNTHROID Take 150 mcg by mouth daily before breakfast.   LORazepam 0.5 MG tablet Commonly known as:  Ativan Take 1 tablet (0.5 mg total) by mouth daily as needed for up to 15 days for anxiety.   methocarbamol 500 MG tablet Commonly known as: ROBAXIN Take 2 tablets (1,000 mg total) by mouth every 6 (six) hours as needed for muscle spasms.   Oxycodone HCl 10 MG Tabs Take 1 tablet (10 mg total) by mouth every 4 (four) hours as needed for severe pain.       ASK your doctor about these medications    amoxicillin-clavulanate 875-125 MG tablet Commonly known as: AUGMENTIN Take 1 tablet by mouth 2 (two) times daily for 7 days. Ask about: Should I take this medication?   ibuprofen 600 MG tablet Commonly known as: ADVIL Take 1 tablet (600 mg total) by mouth every 8 (eight) hours as needed for up to 7 days. Ask about: Should I take this medication?          Follow-up Information     [facility] Follow up.   Specialty: Home Health Services Contact information: [address] [place] [address] [phone]         Andria Meuse, MD Follow up.  Specialties: General Surgery, Colon and Rectal Surgery Why: our office is scheduling you for post-operative follow up in 3 weeks. call to confirm appointment date/time. Contact information: [address] [place] [address] [phone]         Linus Galas, NP Follow up.   Contact information: [address] [place] [address] [phone]         [facility] Follow up.   Why: Please follow up with Interventional Radiology in 7-10 days. A scheduler from our office will call you with a date/time of your appointment. Please call our office with any questions/concerns prior to your visit. Contact information: [address] [place] [address] [phone]                 Signed: Hosie Spangle, [facility] 09/23/2022, 10:15 AM

## 2022-09-24 DIAGNOSIS — E039 Hypothyroidism, unspecified: Secondary | ICD-10-CM | POA: Diagnosis not present

## 2022-09-24 DIAGNOSIS — Z48815 Encounter for surgical aftercare following surgery on the digestive system: Secondary | ICD-10-CM | POA: Diagnosis not present

## 2022-09-24 DIAGNOSIS — Z9181 History of falling: Secondary | ICD-10-CM | POA: Diagnosis not present

## 2022-09-24 DIAGNOSIS — M069 Rheumatoid arthritis, unspecified: Secondary | ICD-10-CM | POA: Diagnosis not present

## 2022-09-24 DIAGNOSIS — F419 Anxiety disorder, unspecified: Secondary | ICD-10-CM | POA: Diagnosis not present

## 2022-09-24 DIAGNOSIS — Z433 Encounter for attention to colostomy: Secondary | ICD-10-CM | POA: Diagnosis not present

## 2022-09-24 DIAGNOSIS — N200 Calculus of kidney: Secondary | ICD-10-CM | POA: Diagnosis not present

## 2022-09-24 DIAGNOSIS — Z87891 Personal history of nicotine dependence: Secondary | ICD-10-CM | POA: Diagnosis not present

## 2022-09-24 DIAGNOSIS — K760 Fatty (change of) liver, not elsewhere classified: Secondary | ICD-10-CM | POA: Diagnosis not present

## 2022-09-24 DIAGNOSIS — E781 Pure hyperglyceridemia: Secondary | ICD-10-CM | POA: Diagnosis not present

## 2022-09-27 DIAGNOSIS — Z9181 History of falling: Secondary | ICD-10-CM | POA: Diagnosis not present

## 2022-09-27 DIAGNOSIS — Z48815 Encounter for surgical aftercare following surgery on the digestive system: Secondary | ICD-10-CM | POA: Diagnosis not present

## 2022-09-27 DIAGNOSIS — Z433 Encounter for attention to colostomy: Secondary | ICD-10-CM | POA: Diagnosis not present

## 2022-09-27 DIAGNOSIS — K760 Fatty (change of) liver, not elsewhere classified: Secondary | ICD-10-CM | POA: Diagnosis not present

## 2022-09-27 DIAGNOSIS — Z87891 Personal history of nicotine dependence: Secondary | ICD-10-CM | POA: Diagnosis not present

## 2022-09-27 DIAGNOSIS — F419 Anxiety disorder, unspecified: Secondary | ICD-10-CM | POA: Diagnosis not present

## 2022-09-27 DIAGNOSIS — M069 Rheumatoid arthritis, unspecified: Secondary | ICD-10-CM | POA: Diagnosis not present

## 2022-09-27 DIAGNOSIS — E039 Hypothyroidism, unspecified: Secondary | ICD-10-CM | POA: Diagnosis not present

## 2022-09-27 DIAGNOSIS — N200 Calculus of kidney: Secondary | ICD-10-CM | POA: Diagnosis not present

## 2022-09-27 DIAGNOSIS — E781 Pure hyperglyceridemia: Secondary | ICD-10-CM | POA: Diagnosis not present

## 2022-09-29 DIAGNOSIS — M069 Rheumatoid arthritis, unspecified: Secondary | ICD-10-CM | POA: Diagnosis not present

## 2022-09-29 DIAGNOSIS — Z87891 Personal history of nicotine dependence: Secondary | ICD-10-CM | POA: Diagnosis not present

## 2022-09-29 DIAGNOSIS — K760 Fatty (change of) liver, not elsewhere classified: Secondary | ICD-10-CM | POA: Diagnosis not present

## 2022-09-29 DIAGNOSIS — N200 Calculus of kidney: Secondary | ICD-10-CM | POA: Diagnosis not present

## 2022-09-29 DIAGNOSIS — Z9181 History of falling: Secondary | ICD-10-CM | POA: Diagnosis not present

## 2022-09-29 DIAGNOSIS — E781 Pure hyperglyceridemia: Secondary | ICD-10-CM | POA: Diagnosis not present

## 2022-09-29 DIAGNOSIS — Z433 Encounter for attention to colostomy: Secondary | ICD-10-CM | POA: Diagnosis not present

## 2022-09-29 DIAGNOSIS — F419 Anxiety disorder, unspecified: Secondary | ICD-10-CM | POA: Diagnosis not present

## 2022-09-29 DIAGNOSIS — Z48815 Encounter for surgical aftercare following surgery on the digestive system: Secondary | ICD-10-CM | POA: Diagnosis not present

## 2022-09-29 DIAGNOSIS — E039 Hypothyroidism, unspecified: Secondary | ICD-10-CM | POA: Diagnosis not present

## 2022-10-01 DIAGNOSIS — E039 Hypothyroidism, unspecified: Secondary | ICD-10-CM | POA: Diagnosis not present

## 2022-10-01 DIAGNOSIS — Z87891 Personal history of nicotine dependence: Secondary | ICD-10-CM | POA: Diagnosis not present

## 2022-10-01 DIAGNOSIS — K760 Fatty (change of) liver, not elsewhere classified: Secondary | ICD-10-CM | POA: Diagnosis not present

## 2022-10-01 DIAGNOSIS — M069 Rheumatoid arthritis, unspecified: Secondary | ICD-10-CM | POA: Diagnosis not present

## 2022-10-01 DIAGNOSIS — N200 Calculus of kidney: Secondary | ICD-10-CM | POA: Diagnosis not present

## 2022-10-01 DIAGNOSIS — Z9181 History of falling: Secondary | ICD-10-CM | POA: Diagnosis not present

## 2022-10-01 DIAGNOSIS — Z433 Encounter for attention to colostomy: Secondary | ICD-10-CM | POA: Diagnosis not present

## 2022-10-01 DIAGNOSIS — Z48815 Encounter for surgical aftercare following surgery on the digestive system: Secondary | ICD-10-CM | POA: Diagnosis not present

## 2022-10-01 DIAGNOSIS — E781 Pure hyperglyceridemia: Secondary | ICD-10-CM | POA: Diagnosis not present

## 2022-10-01 DIAGNOSIS — F419 Anxiety disorder, unspecified: Secondary | ICD-10-CM | POA: Diagnosis not present

## 2022-10-04 DIAGNOSIS — N200 Calculus of kidney: Secondary | ICD-10-CM | POA: Diagnosis not present

## 2022-10-04 DIAGNOSIS — Z433 Encounter for attention to colostomy: Secondary | ICD-10-CM | POA: Diagnosis not present

## 2022-10-04 DIAGNOSIS — K760 Fatty (change of) liver, not elsewhere classified: Secondary | ICD-10-CM | POA: Diagnosis not present

## 2022-10-04 DIAGNOSIS — F419 Anxiety disorder, unspecified: Secondary | ICD-10-CM | POA: Diagnosis not present

## 2022-10-04 DIAGNOSIS — M069 Rheumatoid arthritis, unspecified: Secondary | ICD-10-CM | POA: Diagnosis not present

## 2022-10-04 DIAGNOSIS — E781 Pure hyperglyceridemia: Secondary | ICD-10-CM | POA: Diagnosis not present

## 2022-10-04 DIAGNOSIS — E039 Hypothyroidism, unspecified: Secondary | ICD-10-CM | POA: Diagnosis not present

## 2022-10-04 DIAGNOSIS — Z9181 History of falling: Secondary | ICD-10-CM | POA: Diagnosis not present

## 2022-10-04 DIAGNOSIS — Z48815 Encounter for surgical aftercare following surgery on the digestive system: Secondary | ICD-10-CM | POA: Diagnosis not present

## 2022-10-04 DIAGNOSIS — Z87891 Personal history of nicotine dependence: Secondary | ICD-10-CM | POA: Diagnosis not present

## 2022-10-06 DIAGNOSIS — Z87891 Personal history of nicotine dependence: Secondary | ICD-10-CM | POA: Diagnosis not present

## 2022-10-06 DIAGNOSIS — M069 Rheumatoid arthritis, unspecified: Secondary | ICD-10-CM | POA: Diagnosis not present

## 2022-10-06 DIAGNOSIS — E781 Pure hyperglyceridemia: Secondary | ICD-10-CM | POA: Diagnosis not present

## 2022-10-06 DIAGNOSIS — E039 Hypothyroidism, unspecified: Secondary | ICD-10-CM | POA: Diagnosis not present

## 2022-10-06 DIAGNOSIS — K760 Fatty (change of) liver, not elsewhere classified: Secondary | ICD-10-CM | POA: Diagnosis not present

## 2022-10-06 DIAGNOSIS — Z48815 Encounter for surgical aftercare following surgery on the digestive system: Secondary | ICD-10-CM | POA: Diagnosis not present

## 2022-10-06 DIAGNOSIS — Z9181 History of falling: Secondary | ICD-10-CM | POA: Diagnosis not present

## 2022-10-06 DIAGNOSIS — Z433 Encounter for attention to colostomy: Secondary | ICD-10-CM | POA: Diagnosis not present

## 2022-10-06 DIAGNOSIS — N200 Calculus of kidney: Secondary | ICD-10-CM | POA: Diagnosis not present

## 2022-10-06 DIAGNOSIS — F419 Anxiety disorder, unspecified: Secondary | ICD-10-CM | POA: Diagnosis not present

## 2022-10-07 ENCOUNTER — Other Ambulatory Visit (HOSPITAL_COMMUNITY): Payer: Self-pay

## 2022-10-07 ENCOUNTER — Other Ambulatory Visit: Payer: Self-pay

## 2022-10-08 ENCOUNTER — Other Ambulatory Visit (HOSPITAL_COMMUNITY): Payer: Self-pay

## 2022-10-08 DIAGNOSIS — M069 Rheumatoid arthritis, unspecified: Secondary | ICD-10-CM | POA: Diagnosis not present

## 2022-10-08 DIAGNOSIS — F419 Anxiety disorder, unspecified: Secondary | ICD-10-CM | POA: Diagnosis not present

## 2022-10-08 DIAGNOSIS — K760 Fatty (change of) liver, not elsewhere classified: Secondary | ICD-10-CM | POA: Diagnosis not present

## 2022-10-08 DIAGNOSIS — E781 Pure hyperglyceridemia: Secondary | ICD-10-CM | POA: Diagnosis not present

## 2022-10-08 DIAGNOSIS — E039 Hypothyroidism, unspecified: Secondary | ICD-10-CM | POA: Diagnosis not present

## 2022-10-08 DIAGNOSIS — Z87891 Personal history of nicotine dependence: Secondary | ICD-10-CM | POA: Diagnosis not present

## 2022-10-08 DIAGNOSIS — Z48815 Encounter for surgical aftercare following surgery on the digestive system: Secondary | ICD-10-CM | POA: Diagnosis not present

## 2022-10-08 DIAGNOSIS — Z9181 History of falling: Secondary | ICD-10-CM | POA: Diagnosis not present

## 2022-10-08 DIAGNOSIS — N200 Calculus of kidney: Secondary | ICD-10-CM | POA: Diagnosis not present

## 2022-10-08 DIAGNOSIS — Z433 Encounter for attention to colostomy: Secondary | ICD-10-CM | POA: Diagnosis not present

## 2022-10-11 DIAGNOSIS — E039 Hypothyroidism, unspecified: Secondary | ICD-10-CM | POA: Diagnosis not present

## 2022-10-11 DIAGNOSIS — N200 Calculus of kidney: Secondary | ICD-10-CM | POA: Diagnosis not present

## 2022-10-11 DIAGNOSIS — K760 Fatty (change of) liver, not elsewhere classified: Secondary | ICD-10-CM | POA: Diagnosis not present

## 2022-10-11 DIAGNOSIS — E781 Pure hyperglyceridemia: Secondary | ICD-10-CM | POA: Diagnosis not present

## 2022-10-11 DIAGNOSIS — Z433 Encounter for attention to colostomy: Secondary | ICD-10-CM | POA: Diagnosis not present

## 2022-10-11 DIAGNOSIS — F419 Anxiety disorder, unspecified: Secondary | ICD-10-CM | POA: Diagnosis not present

## 2022-10-11 DIAGNOSIS — Z48815 Encounter for surgical aftercare following surgery on the digestive system: Secondary | ICD-10-CM | POA: Diagnosis not present

## 2022-10-11 DIAGNOSIS — M069 Rheumatoid arthritis, unspecified: Secondary | ICD-10-CM | POA: Diagnosis not present

## 2022-10-11 DIAGNOSIS — Z9181 History of falling: Secondary | ICD-10-CM | POA: Diagnosis not present

## 2022-10-11 DIAGNOSIS — Z87891 Personal history of nicotine dependence: Secondary | ICD-10-CM | POA: Diagnosis not present

## 2022-10-12 ENCOUNTER — Ambulatory Visit (HOSPITAL_COMMUNITY)
Admission: RE | Admit: 2022-10-12 | Discharge: 2022-10-12 | Disposition: A | Discharge status: Home / Self Care | Payer: BC Managed Care – PPO | Source: Ambulatory Visit | Attending: Plastic Surgery | Admitting: Plastic Surgery

## 2022-10-12 DIAGNOSIS — K9400 Colostomy complication, unspecified: Secondary | ICD-10-CM | POA: Diagnosis not present

## 2022-10-12 DIAGNOSIS — L24B3 Irritant contact dermatitis related to fecal or urinary stoma or fistula: Secondary | ICD-10-CM | POA: Diagnosis not present

## 2022-10-12 DIAGNOSIS — Z433 Encounter for attention to colostomy: Secondary | ICD-10-CM | POA: Diagnosis not present

## 2022-10-12 DIAGNOSIS — L259 Unspecified contact dermatitis, unspecified cause: Secondary | ICD-10-CM | POA: Diagnosis not present

## 2022-10-12 DIAGNOSIS — K56609 Unspecified intestinal obstruction, unspecified as to partial versus complete obstruction: Secondary | ICD-10-CM | POA: Diagnosis not present

## 2022-10-12 NOTE — Discharge Instructions (Signed)
Ostomy supplies CONVEX 2 piece Barrier (2 1/4")  ITEM # [id number] Pouch ITEM # [id number] Barrier ring ITEM # [id number] Powder  [id number] Skin prep [id number] Barrier strips  ITEM # [id number]  [employer])  Remove old pouch and wash skin and stoma  Pat dry Apply powder and seal with skin prep x2 Apply Barrier ring Cut oval opening on barrier (PAttern provided)  Wear abdominal binder with hole cut for pouch

## 2022-10-12 NOTE — Progress Notes (Signed)
Brittney Herrera   Reason for visit:  LUQ colostomy HPI:  Laparotomy with Hartmann's procedure and end colostomy Past Medical History:  Diagnosis Date   Arthritis    Bicornuate uterus    Hemorrhoids 11/18/2020   History of abnormal cervical Pap smear    Hypertriglyceridemia 11/18/2020   Hypothyroidism    Rheumatoid aortitis    Thyroid disease    Umbilical hernia 11/28/2020   Family History  Problem Relation Age of Onset   Diabetes Father    Leukemia Father    Heart disease Maternal Grandmother    Heart disease Maternal Grandfather    Heart disease Paternal Grandmother    Skin cancer Other    Colon cancer Neg Hx    Breast cancer Neg Hx    Ovarian cancer Neg Hx    Endometrial cancer Neg Hx    Prostate cancer Neg Hx    Pancreatic cancer Neg Hx    Allergies  Allergen Reactions   Prednisone Swelling    Facial swelling     Tramadol Other (See Comments)    "Irritability"   Current Outpatient Medications  Medication Sig Dispense Refill Last Dose   acetaminophen (TYLENOL) 500 MG tablet Take 2 tablets (1,000 mg total) by mouth every 6 (six) hours as needed. 100 tablet 0    gabapentin (NEURONTIN) 300 MG capsule Take 300 mg by mouth daily as needed (rheumatoid arthritis).      levothyroxine (SYNTHROID) 150 MCG tablet Take 150 mcg by mouth daily before breakfast.      methocarbamol (ROBAXIN) 500 MG tablet Take 2 tablets (1,000 mg total) by mouth every 6 (six) hours as needed for muscle spasms. 80 tablet 1    Oxycodone HCl 10 MG TABS Take 1 tablet (10 mg total) by mouth every 4 (four) hours as needed for severe pain. 30 tablet 0    No current facility-administered medications for this encounter.   ROS  Review of Systems  Constitutional:  Positive for fatigue.  Gastrointestinal:        LUQ colostomy  Skin:  Positive for wound.       Midline abdominal wound  VAC in place, improving  Psychiatric/Behavioral: Negative.    All other systems reviewed and are  negative.  Vital signs:  BP 126/80   Pulse 71   Temp 97.8 F (36.6 C)   Resp 17   SpO2 97%  Exam:  Physical Exam Vitals reviewed.  Constitutional:      Appearance: Normal appearance.  Abdominal:     Palpations: Abdomen is soft.     Comments: Open wound with VAC  Skin:    General: Skin is warm and dry.     Findings: Rash present.  Neurological:     Mental Status: She is alert and oriented to person, place, and time.  Psychiatric:        Mood and Affect: Mood normal.        Behavior: Behavior normal.     Stoma type/location:  LUQ colostomy Stomal assessment/size:  oval stoma, pink and moist, flush on one side Peristomal assessment:  Irritation to peristomal skin at barrier perimeter  Switching to smaller pouch system 2 1/4" with convex barrier.  Was using flat. Treatment options for stomal/peristomal skin: barrier ring  2 piece convex system powder and skin prep for irritation.  Barrier ring Wearing abdominal binder with hole cut for pouch.     Output: soft brown stool Ostomy pouching: 2pc. convex  Education provided:  pouch change  performed  samples given   Will enroll with [facility] still in place and will need to order supplies for now    Impression/dx  Contact dermatitis Colostomy complication Discussion  See above Plan  See back 1 week to assess skin    Visit time: 50 minutes.   Maple Hudson FNP-BC

## 2022-10-13 DIAGNOSIS — Z87891 Personal history of nicotine dependence: Secondary | ICD-10-CM | POA: Diagnosis not present

## 2022-10-13 DIAGNOSIS — E781 Pure hyperglyceridemia: Secondary | ICD-10-CM | POA: Diagnosis not present

## 2022-10-13 DIAGNOSIS — Z9181 History of falling: Secondary | ICD-10-CM | POA: Diagnosis not present

## 2022-10-13 DIAGNOSIS — F419 Anxiety disorder, unspecified: Secondary | ICD-10-CM | POA: Diagnosis not present

## 2022-10-13 DIAGNOSIS — Z48815 Encounter for surgical aftercare following surgery on the digestive system: Secondary | ICD-10-CM | POA: Diagnosis not present

## 2022-10-13 DIAGNOSIS — N200 Calculus of kidney: Secondary | ICD-10-CM | POA: Diagnosis not present

## 2022-10-13 DIAGNOSIS — M069 Rheumatoid arthritis, unspecified: Secondary | ICD-10-CM | POA: Diagnosis not present

## 2022-10-13 DIAGNOSIS — Z433 Encounter for attention to colostomy: Secondary | ICD-10-CM | POA: Diagnosis not present

## 2022-10-13 DIAGNOSIS — E039 Hypothyroidism, unspecified: Secondary | ICD-10-CM | POA: Diagnosis not present

## 2022-10-13 DIAGNOSIS — K760 Fatty (change of) liver, not elsewhere classified: Secondary | ICD-10-CM | POA: Diagnosis not present

## 2022-10-14 DIAGNOSIS — K9400 Colostomy complication, unspecified: Secondary | ICD-10-CM | POA: Insufficient documentation

## 2022-10-14 DIAGNOSIS — L24B3 Irritant contact dermatitis related to fecal or urinary stoma or fistula: Secondary | ICD-10-CM | POA: Insufficient documentation

## 2022-10-15 DIAGNOSIS — K760 Fatty (change of) liver, not elsewhere classified: Secondary | ICD-10-CM | POA: Diagnosis not present

## 2022-10-15 DIAGNOSIS — E781 Pure hyperglyceridemia: Secondary | ICD-10-CM | POA: Diagnosis not present

## 2022-10-15 DIAGNOSIS — M069 Rheumatoid arthritis, unspecified: Secondary | ICD-10-CM | POA: Diagnosis not present

## 2022-10-15 DIAGNOSIS — N200 Calculus of kidney: Secondary | ICD-10-CM | POA: Diagnosis not present

## 2022-10-15 DIAGNOSIS — Z433 Encounter for attention to colostomy: Secondary | ICD-10-CM | POA: Diagnosis not present

## 2022-10-15 DIAGNOSIS — Z48815 Encounter for surgical aftercare following surgery on the digestive system: Secondary | ICD-10-CM | POA: Diagnosis not present

## 2022-10-15 DIAGNOSIS — Z87891 Personal history of nicotine dependence: Secondary | ICD-10-CM | POA: Diagnosis not present

## 2022-10-15 DIAGNOSIS — F419 Anxiety disorder, unspecified: Secondary | ICD-10-CM | POA: Diagnosis not present

## 2022-10-15 DIAGNOSIS — Z933 Colostomy status: Secondary | ICD-10-CM | POA: Diagnosis not present

## 2022-10-15 DIAGNOSIS — E039 Hypothyroidism, unspecified: Secondary | ICD-10-CM | POA: Diagnosis not present

## 2022-10-15 DIAGNOSIS — Z9181 History of falling: Secondary | ICD-10-CM | POA: Diagnosis not present

## 2022-10-18 DIAGNOSIS — Z48815 Encounter for surgical aftercare following surgery on the digestive system: Secondary | ICD-10-CM | POA: Diagnosis not present

## 2022-10-18 DIAGNOSIS — Z87891 Personal history of nicotine dependence: Secondary | ICD-10-CM | POA: Diagnosis not present

## 2022-10-18 DIAGNOSIS — Z433 Encounter for attention to colostomy: Secondary | ICD-10-CM | POA: Diagnosis not present

## 2022-10-18 DIAGNOSIS — E039 Hypothyroidism, unspecified: Secondary | ICD-10-CM | POA: Diagnosis not present

## 2022-10-18 DIAGNOSIS — Z9181 History of falling: Secondary | ICD-10-CM | POA: Diagnosis not present

## 2022-10-18 DIAGNOSIS — F419 Anxiety disorder, unspecified: Secondary | ICD-10-CM | POA: Diagnosis not present

## 2022-10-18 DIAGNOSIS — M069 Rheumatoid arthritis, unspecified: Secondary | ICD-10-CM | POA: Diagnosis not present

## 2022-10-18 DIAGNOSIS — N200 Calculus of kidney: Secondary | ICD-10-CM | POA: Diagnosis not present

## 2022-10-18 DIAGNOSIS — E781 Pure hyperglyceridemia: Secondary | ICD-10-CM | POA: Diagnosis not present

## 2022-10-18 DIAGNOSIS — K760 Fatty (change of) liver, not elsewhere classified: Secondary | ICD-10-CM | POA: Diagnosis not present

## 2022-10-19 DIAGNOSIS — Z8719 Personal history of other diseases of the digestive system: Secondary | ICD-10-CM | POA: Diagnosis not present

## 2022-10-19 DIAGNOSIS — Z9049 Acquired absence of other specified parts of digestive tract: Secondary | ICD-10-CM | POA: Diagnosis not present

## 2022-10-19 DIAGNOSIS — Z09 Encounter for follow-up examination after completed treatment for conditions other than malignant neoplasm: Secondary | ICD-10-CM | POA: Diagnosis not present

## 2022-10-19 DIAGNOSIS — N838 Other noninflammatory disorders of ovary, fallopian tube and broad ligament: Secondary | ICD-10-CM | POA: Diagnosis not present

## 2022-10-20 DIAGNOSIS — N200 Calculus of kidney: Secondary | ICD-10-CM | POA: Diagnosis not present

## 2022-10-20 DIAGNOSIS — Z48815 Encounter for surgical aftercare following surgery on the digestive system: Secondary | ICD-10-CM | POA: Diagnosis not present

## 2022-10-20 DIAGNOSIS — E781 Pure hyperglyceridemia: Secondary | ICD-10-CM | POA: Diagnosis not present

## 2022-10-20 DIAGNOSIS — Z433 Encounter for attention to colostomy: Secondary | ICD-10-CM | POA: Diagnosis not present

## 2022-10-20 DIAGNOSIS — F419 Anxiety disorder, unspecified: Secondary | ICD-10-CM | POA: Diagnosis not present

## 2022-10-20 DIAGNOSIS — M069 Rheumatoid arthritis, unspecified: Secondary | ICD-10-CM | POA: Diagnosis not present

## 2022-10-20 DIAGNOSIS — Z9181 History of falling: Secondary | ICD-10-CM | POA: Diagnosis not present

## 2022-10-20 DIAGNOSIS — K760 Fatty (change of) liver, not elsewhere classified: Secondary | ICD-10-CM | POA: Diagnosis not present

## 2022-10-20 DIAGNOSIS — E039 Hypothyroidism, unspecified: Secondary | ICD-10-CM | POA: Diagnosis not present

## 2022-10-20 DIAGNOSIS — Z87891 Personal history of nicotine dependence: Secondary | ICD-10-CM | POA: Diagnosis not present

## 2022-10-21 DIAGNOSIS — Z433 Encounter for attention to colostomy: Secondary | ICD-10-CM | POA: Diagnosis not present

## 2022-10-22 DIAGNOSIS — E039 Hypothyroidism, unspecified: Secondary | ICD-10-CM | POA: Diagnosis not present

## 2022-10-22 DIAGNOSIS — M069 Rheumatoid arthritis, unspecified: Secondary | ICD-10-CM | POA: Diagnosis not present

## 2022-10-22 DIAGNOSIS — Z87891 Personal history of nicotine dependence: Secondary | ICD-10-CM | POA: Diagnosis not present

## 2022-10-22 DIAGNOSIS — Z9181 History of falling: Secondary | ICD-10-CM | POA: Diagnosis not present

## 2022-10-22 DIAGNOSIS — K760 Fatty (change of) liver, not elsewhere classified: Secondary | ICD-10-CM | POA: Diagnosis not present

## 2022-10-22 DIAGNOSIS — Z48815 Encounter for surgical aftercare following surgery on the digestive system: Secondary | ICD-10-CM | POA: Diagnosis not present

## 2022-10-22 DIAGNOSIS — F419 Anxiety disorder, unspecified: Secondary | ICD-10-CM | POA: Diagnosis not present

## 2022-10-22 DIAGNOSIS — Z433 Encounter for attention to colostomy: Secondary | ICD-10-CM | POA: Diagnosis not present

## 2022-10-22 DIAGNOSIS — E781 Pure hyperglyceridemia: Secondary | ICD-10-CM | POA: Diagnosis not present

## 2022-10-22 DIAGNOSIS — N200 Calculus of kidney: Secondary | ICD-10-CM | POA: Diagnosis not present

## 2022-10-26 DIAGNOSIS — F419 Anxiety disorder, unspecified: Secondary | ICD-10-CM | POA: Diagnosis not present

## 2022-10-26 DIAGNOSIS — E039 Hypothyroidism, unspecified: Secondary | ICD-10-CM | POA: Diagnosis not present

## 2022-10-26 DIAGNOSIS — Z9181 History of falling: Secondary | ICD-10-CM | POA: Diagnosis not present

## 2022-10-26 DIAGNOSIS — K760 Fatty (change of) liver, not elsewhere classified: Secondary | ICD-10-CM | POA: Diagnosis not present

## 2022-10-26 DIAGNOSIS — Z87891 Personal history of nicotine dependence: Secondary | ICD-10-CM | POA: Diagnosis not present

## 2022-10-26 DIAGNOSIS — E781 Pure hyperglyceridemia: Secondary | ICD-10-CM | POA: Diagnosis not present

## 2022-10-26 DIAGNOSIS — M069 Rheumatoid arthritis, unspecified: Secondary | ICD-10-CM | POA: Diagnosis not present

## 2022-10-26 DIAGNOSIS — N200 Calculus of kidney: Secondary | ICD-10-CM | POA: Diagnosis not present

## 2022-10-26 DIAGNOSIS — Z433 Encounter for attention to colostomy: Secondary | ICD-10-CM | POA: Diagnosis not present

## 2022-10-26 DIAGNOSIS — Z48815 Encounter for surgical aftercare following surgery on the digestive system: Secondary | ICD-10-CM | POA: Diagnosis not present

## 2022-10-27 DIAGNOSIS — Z433 Encounter for attention to colostomy: Secondary | ICD-10-CM | POA: Diagnosis not present

## 2022-10-27 DIAGNOSIS — E781 Pure hyperglyceridemia: Secondary | ICD-10-CM | POA: Diagnosis not present

## 2022-10-27 DIAGNOSIS — F419 Anxiety disorder, unspecified: Secondary | ICD-10-CM | POA: Diagnosis not present

## 2022-10-27 DIAGNOSIS — E039 Hypothyroidism, unspecified: Secondary | ICD-10-CM | POA: Diagnosis not present

## 2022-10-27 DIAGNOSIS — Z48815 Encounter for surgical aftercare following surgery on the digestive system: Secondary | ICD-10-CM | POA: Diagnosis not present

## 2022-10-27 DIAGNOSIS — Z87891 Personal history of nicotine dependence: Secondary | ICD-10-CM | POA: Diagnosis not present

## 2022-10-27 DIAGNOSIS — Z9181 History of falling: Secondary | ICD-10-CM | POA: Diagnosis not present

## 2022-10-27 DIAGNOSIS — M069 Rheumatoid arthritis, unspecified: Secondary | ICD-10-CM | POA: Diagnosis not present

## 2022-10-27 DIAGNOSIS — K760 Fatty (change of) liver, not elsewhere classified: Secondary | ICD-10-CM | POA: Diagnosis not present

## 2022-10-27 DIAGNOSIS — N200 Calculus of kidney: Secondary | ICD-10-CM | POA: Diagnosis not present

## 2022-10-29 DIAGNOSIS — K760 Fatty (change of) liver, not elsewhere classified: Secondary | ICD-10-CM | POA: Diagnosis not present

## 2022-10-29 DIAGNOSIS — Z433 Encounter for attention to colostomy: Secondary | ICD-10-CM | POA: Diagnosis not present

## 2022-10-29 DIAGNOSIS — Z48815 Encounter for surgical aftercare following surgery on the digestive system: Secondary | ICD-10-CM | POA: Diagnosis not present

## 2022-10-29 DIAGNOSIS — Z9181 History of falling: Secondary | ICD-10-CM | POA: Diagnosis not present

## 2022-10-29 DIAGNOSIS — Z87891 Personal history of nicotine dependence: Secondary | ICD-10-CM | POA: Diagnosis not present

## 2022-10-29 DIAGNOSIS — M069 Rheumatoid arthritis, unspecified: Secondary | ICD-10-CM | POA: Diagnosis not present

## 2022-10-29 DIAGNOSIS — E039 Hypothyroidism, unspecified: Secondary | ICD-10-CM | POA: Diagnosis not present

## 2022-10-29 DIAGNOSIS — E781 Pure hyperglyceridemia: Secondary | ICD-10-CM | POA: Diagnosis not present

## 2022-10-29 DIAGNOSIS — F419 Anxiety disorder, unspecified: Secondary | ICD-10-CM | POA: Diagnosis not present

## 2022-10-29 DIAGNOSIS — N200 Calculus of kidney: Secondary | ICD-10-CM | POA: Diagnosis not present

## 2022-11-01 DIAGNOSIS — K760 Fatty (change of) liver, not elsewhere classified: Secondary | ICD-10-CM | POA: Diagnosis not present

## 2022-11-01 DIAGNOSIS — M069 Rheumatoid arthritis, unspecified: Secondary | ICD-10-CM | POA: Diagnosis not present

## 2022-11-01 DIAGNOSIS — Z433 Encounter for attention to colostomy: Secondary | ICD-10-CM | POA: Diagnosis not present

## 2022-11-01 DIAGNOSIS — E039 Hypothyroidism, unspecified: Secondary | ICD-10-CM | POA: Diagnosis not present

## 2022-11-01 DIAGNOSIS — E781 Pure hyperglyceridemia: Secondary | ICD-10-CM | POA: Diagnosis not present

## 2022-11-01 DIAGNOSIS — N200 Calculus of kidney: Secondary | ICD-10-CM | POA: Diagnosis not present

## 2022-11-01 DIAGNOSIS — Z48815 Encounter for surgical aftercare following surgery on the digestive system: Secondary | ICD-10-CM | POA: Diagnosis not present

## 2022-11-01 DIAGNOSIS — Z87891 Personal history of nicotine dependence: Secondary | ICD-10-CM | POA: Diagnosis not present

## 2022-11-01 DIAGNOSIS — Z9181 History of falling: Secondary | ICD-10-CM | POA: Diagnosis not present

## 2022-11-01 DIAGNOSIS — F419 Anxiety disorder, unspecified: Secondary | ICD-10-CM | POA: Diagnosis not present

## 2022-11-03 DIAGNOSIS — M069 Rheumatoid arthritis, unspecified: Secondary | ICD-10-CM | POA: Diagnosis not present

## 2022-11-03 DIAGNOSIS — Z48815 Encounter for surgical aftercare following surgery on the digestive system: Secondary | ICD-10-CM | POA: Diagnosis not present

## 2022-11-03 DIAGNOSIS — Z87891 Personal history of nicotine dependence: Secondary | ICD-10-CM | POA: Diagnosis not present

## 2022-11-03 DIAGNOSIS — K760 Fatty (change of) liver, not elsewhere classified: Secondary | ICD-10-CM | POA: Diagnosis not present

## 2022-11-03 DIAGNOSIS — F419 Anxiety disorder, unspecified: Secondary | ICD-10-CM | POA: Diagnosis not present

## 2022-11-03 DIAGNOSIS — E781 Pure hyperglyceridemia: Secondary | ICD-10-CM | POA: Diagnosis not present

## 2022-11-03 DIAGNOSIS — N200 Calculus of kidney: Secondary | ICD-10-CM | POA: Diagnosis not present

## 2022-11-03 DIAGNOSIS — Z433 Encounter for attention to colostomy: Secondary | ICD-10-CM | POA: Diagnosis not present

## 2022-11-03 DIAGNOSIS — E039 Hypothyroidism, unspecified: Secondary | ICD-10-CM | POA: Diagnosis not present

## 2022-11-03 DIAGNOSIS — Z9181 History of falling: Secondary | ICD-10-CM | POA: Diagnosis not present

## 2022-11-05 DIAGNOSIS — F419 Anxiety disorder, unspecified: Secondary | ICD-10-CM | POA: Diagnosis not present

## 2022-11-05 DIAGNOSIS — N200 Calculus of kidney: Secondary | ICD-10-CM | POA: Diagnosis not present

## 2022-11-05 DIAGNOSIS — Z48815 Encounter for surgical aftercare following surgery on the digestive system: Secondary | ICD-10-CM | POA: Diagnosis not present

## 2022-11-05 DIAGNOSIS — M069 Rheumatoid arthritis, unspecified: Secondary | ICD-10-CM | POA: Diagnosis not present

## 2022-11-05 DIAGNOSIS — Z9181 History of falling: Secondary | ICD-10-CM | POA: Diagnosis not present

## 2022-11-05 DIAGNOSIS — E781 Pure hyperglyceridemia: Secondary | ICD-10-CM | POA: Diagnosis not present

## 2022-11-05 DIAGNOSIS — E039 Hypothyroidism, unspecified: Secondary | ICD-10-CM | POA: Diagnosis not present

## 2022-11-05 DIAGNOSIS — Z87891 Personal history of nicotine dependence: Secondary | ICD-10-CM | POA: Diagnosis not present

## 2022-11-05 DIAGNOSIS — K760 Fatty (change of) liver, not elsewhere classified: Secondary | ICD-10-CM | POA: Diagnosis not present

## 2022-11-05 DIAGNOSIS — Z433 Encounter for attention to colostomy: Secondary | ICD-10-CM | POA: Diagnosis not present

## 2022-11-22 DIAGNOSIS — Z433 Encounter for attention to colostomy: Secondary | ICD-10-CM | POA: Diagnosis not present

## 2022-12-17 ENCOUNTER — Other Ambulatory Visit (HOSPITAL_BASED_OUTPATIENT_CLINIC_OR_DEPARTMENT_OTHER): Payer: Self-pay

## 2022-12-27 DIAGNOSIS — Z433 Encounter for attention to colostomy: Secondary | ICD-10-CM | POA: Diagnosis not present

## 2023-01-04 DIAGNOSIS — Z8719 Personal history of other diseases of the digestive system: Secondary | ICD-10-CM | POA: Diagnosis not present

## 2023-01-04 DIAGNOSIS — L729 Follicular cyst of the skin and subcutaneous tissue, unspecified: Secondary | ICD-10-CM | POA: Diagnosis not present

## 2023-01-04 DIAGNOSIS — Z9889 Other specified postprocedural states: Secondary | ICD-10-CM | POA: Diagnosis not present

## 2023-01-04 DIAGNOSIS — Z933 Colostomy status: Secondary | ICD-10-CM | POA: Diagnosis not present

## 2023-01-05 ENCOUNTER — Other Ambulatory Visit (HOSPITAL_COMMUNITY): Payer: Self-pay | Admitting: Surgery

## 2023-01-05 DIAGNOSIS — Z8719 Personal history of other diseases of the digestive system: Secondary | ICD-10-CM

## 2023-01-05 DIAGNOSIS — Z933 Colostomy status: Secondary | ICD-10-CM

## 2023-01-07 ENCOUNTER — Telehealth: Payer: Self-pay | Admitting: Plastic Surgery

## 2023-01-07 NOTE — Telephone Encounter (Signed)
pt texted no on 01-05-23 to apt for the consult

## 2023-01-12 ENCOUNTER — Ambulatory Visit: Payer: BC Managed Care – PPO | Admitting: Gastroenterology

## 2023-01-12 ENCOUNTER — Encounter: Payer: Self-pay | Admitting: Gastroenterology

## 2023-01-12 ENCOUNTER — Institutional Professional Consult (permissible substitution): Payer: BC Managed Care – PPO | Admitting: Plastic Surgery

## 2023-01-12 VITALS — BP 132/88 | HR 72 | Ht 62.5 in | Wt 191.4 lb

## 2023-01-12 DIAGNOSIS — Z1211 Encounter for screening for malignant neoplasm of colon: Secondary | ICD-10-CM | POA: Insufficient documentation

## 2023-01-12 DIAGNOSIS — K631 Perforation of intestine (nontraumatic): Secondary | ICD-10-CM

## 2023-01-12 DIAGNOSIS — K5720 Diverticulitis of large intestine with perforation and abscess without bleeding: Secondary | ICD-10-CM

## 2023-01-12 DIAGNOSIS — Z9049 Acquired absence of other specified parts of digestive tract: Secondary | ICD-10-CM

## 2023-01-12 NOTE — Progress Notes (Signed)
01/12/2023 Brittney Herrera [id number] 1975/12/28   HISTORY OF PRESENT ILLNESS:  This is a 47 year old female who is new to our office.  She is here today at the referral of Dr. Cliffton Asters to discuss and schedule colonoscopy.  Had a complicated course in July with bowel perforation.  Required surgery with a Hartman's pouch and colostomy.  Last CT scan on 09/16/2022:  IMPRESSION: 1. Near-complete resolution of previously described fluid collections in the left abdomen and anterior midline abdomen. No new intra-abdominal fluid collections. 2. Similar appearance of prominent retroperitoneal lymph nodes, presumably reactive in etiology.  No blood in ostomy.  Says that she is scheduled for a barium enema tomorrow that Dr. Cliffton Asters ordered.  No colonoscopy in the past.  Has had several abdominal surgeries for different reasons in the past couple of years.   Past Medical History:  Diagnosis Date   Arthritis    Bicornuate uterus    Hemorrhoids 11/18/2020   History of abnormal cervical Pap smear    Hypertriglyceridemia 11/18/2020   Hypothyroidism    Rheumatoid aortitis    Thyroid disease    Umbilical hernia 11/28/2020   Past Surgical History:  Procedure Laterality Date   ABDOMINAL ADVANCEMENT FLAP Bilateral 01/05/2022   Procedure: ABDOMINAL ADVANCEMENT FLAP;  Surgeon: Quentin Ore, MD;  Location: [facility];  Service: General;  Laterality: Bilateral;  Bilateral posterior rectus sheath and right sided transversus abdominis myofascial advancement flaps   ADENOIDECTOMY  02/22/1982   CESAREAN SECTION  04/07/2005   CESAREAN SECTION  09/24/2011   cyst N/A 12/15/2020   2 scalp cysts removed, benign   CYSTOSCOPY W/ RETROGRADES  01/22/2021   Procedure: CYSTOSCOPY WITH RETROGRADE PYELOGRAM;FISTULOGRAM;  Surgeon: Belva Agee, MD;  Location: [facility];  Service: Urology;;   DILATATION & CURETTAGE/HYSTEROSCOPY WITH MYOSURE N/A 01/20/2021   Procedure: DILATATION & CURETTAGE/HYSTEROSCOPY WITH  MYOSURE;  Surgeon: Carver Fila, MD;  Location: [facility];  Service: Gynecology;  Laterality: N/A;   DILATION AND CURETTAGE, DIAGNOSTIC / THERAPEUTIC  12/23/2012   FLEXIBLE SIGMOIDOSCOPY N/A 01/22/2021   Procedure: DIAGNOSTIC FLEXIBLE SIGMOIDOSCOPY;  Surgeon: Andria Meuse, MD;  Location: [facility];  Service: General;  Laterality: N/A;   INCISIONAL HERNIA REPAIR N/A 01/05/2022   Procedure: OPEN INCISIONAL HERNIA REPAIR WITH BARD MESH;  Surgeon: Quentin Ore, MD;  Location: [facility];  Service: General;  Laterality: N/A;   INSERTION OF MESH  01/05/2022   Procedure: INSERTION OF MESH;  Surgeon: Quentin Ore, MD;  Location: [facility];  Service: General;;   IR RADIOLOGIST EVAL & MGMT  09/16/2022   LAPAROTOMY N/A 01/22/2021   Procedure: EXPLORATORY LAPAROTOMY;  Surgeon: Carver Fila, MD;  Location: [facility];  Service: Gynecology;  Laterality: N/A;   LAPAROTOMY N/A 01/22/2021   Procedure: EXPLORATORY LAPAROTOMY;  Surgeon: Andria Meuse, MD;  Location: [facility];  Service: General;  Laterality: N/A;   LAPAROTOMY N/A 08/24/2022   Procedure: EXPLORATORY LAPAROTOMY, SMALL BOWEL RESECTION AND REMOVAL OF APPENDIX;  Surgeon: Manus Rudd, MD;  Location: [facility];  Service: General;  Laterality: N/A;   LAPAROTOMY N/A 08/26/2022   Procedure: EXPLORATORY LAPAROTOMY, WASHOUT, SMALL BOWEL ANASTOMOSIS, HARTMANN'S RESECTION, TAKE DOWN SPLENIC FLEXURE;  Surgeon: Andria Meuse, MD;  Location: [facility];  Service: General;  Laterality: N/A;   MYRINGOTOMY WITH TUBE PLACEMENT     TUBAL LIGATION Bilateral 12/23/2012   WISDOM TOOTH EXTRACTION  02/22/1993   XI ROBOTIC ASSISTED SALPINGECTOMY Right 01/20/2021   Procedure: XI ROBOTIC  ASSISTED RIGHT  SALPINGECTOMY, LEFT OVARIAN BIOPSY;  Surgeon: Carver Fila, MD;  Location: [facility];  Service: Gynecology;  Laterality: Right;    reports that she quit smoking about 11 years ago. Her smoking use included cigarettes. She started smoking about 31 years  ago. She has a 5 pack-year smoking history. She has never used smokeless tobacco. She reports current alcohol use of about 6.0 standard drinks of alcohol per week. She reports that she does not use drugs. family history includes Diabetes in her father; Heart disease in her maternal grandfather, maternal grandmother, and paternal grandmother; Leukemia in her father; Skin cancer in an other family member. Allergies  Allergen Reactions   Prednisone Swelling    Facial swelling     Toradol [Ketorolac Tromethamine] Photosensitivity    Hot flashes      Outpatient Encounter Medications as of 01/12/2023  Medication Sig   acetaminophen (TYLENOL) 500 MG tablet Take 2 tablets (1,000 mg total) by mouth every 6 (six) hours as needed.   levothyroxine (SYNTHROID) 150 MCG tablet Take 150 mcg by mouth daily before breakfast.   [DISCONTINUED] gabapentin (NEURONTIN) 300 MG capsule Take 300 mg by mouth daily as needed (rheumatoid arthritis).   [DISCONTINUED] methocarbamol (ROBAXIN) 500 MG tablet Take 2 tablets (1,000 mg total) by mouth every 6 (six) hours as needed for muscle spasms.   [DISCONTINUED] Oxycodone HCl 10 MG TABS Take 1 tablet (10 mg total) by mouth every 4 (four) hours as needed for severe pain.   No facility-administered encounter medications on file as of 01/12/2023.    REVIEW OF SYSTEMS  : All other systems reviewed and negative except where noted in the History of Present Illness.   PHYSICAL EXAM: BP 132/88   Pulse 72   Ht 5' 2.5" (1.588 m)   Wt 191 lb 6.4 oz (86.8 kg)   BMI 34.45 kg/m  General: Well developed white female in no acute distress Head: Normocephalic and atraumatic Eyes:  Sclerae anicteric, conjunctiva pink. Ears: Normal auditory acuity Lungs: Clear throughout to auscultation; no W/R/R. Heart: Regular rate and rhythm; no M/R/G. Abdomen: Soft, non-distended.  BS present.  Ostomy noted on the left abdomen and large laparotomy scar noted. Rectal:  Will be done at the  time of colonoscopy. Musculoskeletal: Symmetrical with no gross deformities  Skin: No lesions on visible extremities Extremities: No edema  Neurological: Alert oriented x 4, grossly non-focal Psychological:  Alert and cooperative. Normal mood and affect  ASSESSMENT AND PLAN: *CRC screening:  Never had a colonoscopy in the past.  Had a perforated diverticulitis in July and now has an ostomy.  Dr. Cliffton Asters requesting colonoscopy.  Will schedule with Dr. Myrtie Neither as she would like it done by the end of the year.  The risks, benefits, and alternatives to colonoscopy were discussed with the patient and she consents to proceed.  CC:  Linus Galas, NP CC:  Dr. Cliffton Asters

## 2023-01-12 NOTE — Patient Instructions (Signed)
You have been scheduled for a colonoscopy. Please follow written instructions given to you at your visit today.   Please pick up your prep supplies at the pharmacy within the next 1-3 days.  If you use inhalers (even only as needed), please bring them with you on the day of your procedure.  DO NOT TAKE 7 DAYS PRIOR TO TEST- Trulicity (dulaglutide) Ozempic, Wegovy (semaglutide) Mounjaro (tirzepatide) Bydureon Bcise (exanatide extended release)  DO NOT TAKE 1 DAY PRIOR TO YOUR TEST Rybelsus (semaglutide) Adlyxin (lixisenatide) Victoza (liraglutide) Byetta (exanatide) ______________________________________________________________________  _______________________________________________________  If your blood pressure at your visit was 140/90 or greater, please contact your primary care physician to follow up on this.  _______________________________________________________  If you are age 75 or older, your body mass index should be between 23-30. Your Body mass index is 34.45 kg/m. If this is out of the aforementioned range listed, please consider follow up with your Primary Care Provider.  If you are age 48 or younger, your body mass index should be between 19-25. Your Body mass index is 34.45 kg/m. If this is out of the aformentioned range listed, please consider follow up with your Primary Care Provider.   ________________________________________________________  The [facility] providers would like to encourage you to use [facility] to communicate with providers for non-urgent requests or questions.  Due to long hold times on the telephone, sending your provider a message by [facility] may be a faster and more efficient way to get a response.  Please allow 48 business hours for a response.  Please remember that this is for non-urgent requests.  _______________________________________________________

## 2023-01-13 ENCOUNTER — Ambulatory Visit (HOSPITAL_COMMUNITY)
Admission: RE | Admit: 2023-01-13 | Discharge: 2023-01-13 | Disposition: A | Discharge status: Home / Self Care | Payer: BC Managed Care – PPO | Source: Ambulatory Visit | Attending: Surgery | Admitting: Surgery

## 2023-01-13 DIAGNOSIS — Z8719 Personal history of other diseases of the digestive system: Secondary | ICD-10-CM | POA: Insufficient documentation

## 2023-01-13 DIAGNOSIS — K5730 Diverticulosis of large intestine without perforation or abscess without bleeding: Secondary | ICD-10-CM | POA: Diagnosis not present

## 2023-01-13 DIAGNOSIS — Z9889 Other specified postprocedural states: Secondary | ICD-10-CM | POA: Insufficient documentation

## 2023-01-13 DIAGNOSIS — Z933 Colostomy status: Secondary | ICD-10-CM | POA: Insufficient documentation

## 2023-01-13 MED ORDER — IOHEXOL 300 MG/ML  SOLN
100.0000 mL | Freq: Once | INTRAMUSCULAR | Status: AC | PRN
  Administered 2023-01-13: 100 mL

## 2023-01-14 NOTE — Progress Notes (Signed)
____________________________________________________________  Attending physician addendum:  Thank you for sending this case to me. I have reviewed the entire note and agree with the plan.  Please be sure that, in addition to the oral bowel prep for the colonoscopy, that she is given instructions to use 2 fleets enemas the morning of procedure.  Use the enemas one after the other, and do them after completing the morning prep dose. We want to be able to examine the rectal pouch.  Amada Jupiter, MD  ____________________________________________________________

## 2023-01-14 NOTE — Progress Notes (Signed)
Yes, I would still ike to evaluate for any rectal polyps before they take down the colostomy.  - HD

## 2023-01-17 NOTE — Progress Notes (Signed)
Left message for patient to call office.  

## 2023-01-24 ENCOUNTER — Telehealth: Payer: Self-pay | Admitting: Gastroenterology

## 2023-01-24 NOTE — Telephone Encounter (Signed)
PT is scheduled for a colonoscopy and has an ostomy. She wants to know should she come with 1 pice, 2 piece or click system or will it all be removed. Please advise.

## 2023-01-24 NOTE — Telephone Encounter (Signed)
Dr Myrtie Neither can you please help with this question please?

## 2023-01-25 NOTE — Telephone Encounter (Signed)
If she has a two piece with a base that sticks to the abdominal wall and a bag that clicks on and off the base, then bring a fresh bag. If she has a one piece ostomy, then bring a whole new setup to put on after the procedure.  - HD

## 2023-01-25 NOTE — Telephone Encounter (Signed)
Left message on machine to call back  

## 2023-01-26 DIAGNOSIS — R220 Localized swelling, mass and lump, head: Secondary | ICD-10-CM | POA: Diagnosis not present

## 2023-01-27 NOTE — Telephone Encounter (Signed)
Spoke with patient regarding MD recommendations. She also had questions about which jello would be okay for procedure. Advised she avoid red/pink/purple, but green should be okay. Pt verbalized all understanding.

## 2023-01-28 ENCOUNTER — Ambulatory Visit (AMBULATORY_SURGERY_CENTER): Payer: BC Managed Care – PPO | Admitting: Gastroenterology

## 2023-01-28 ENCOUNTER — Encounter: Payer: Self-pay | Admitting: Gastroenterology

## 2023-01-28 VITALS — BP 102/78 | HR 81 | Temp 98.0°F | Resp 15 | Ht 62.0 in | Wt 191.0 lb

## 2023-01-28 DIAGNOSIS — K5730 Diverticulosis of large intestine without perforation or abscess without bleeding: Secondary | ICD-10-CM

## 2023-01-28 DIAGNOSIS — Z1211 Encounter for screening for malignant neoplasm of colon: Secondary | ICD-10-CM

## 2023-01-28 MED ORDER — SODIUM CHLORIDE 0.9 % IV SOLN: 500.0000 mL | Freq: Once | INTRAVENOUS | Status: DC

## 2023-01-28 NOTE — Op Note (Signed)
[facility] Patient Name: Brittney Herrera Procedure Date: 01/28/2023 2:06 PM MRN: [id number] Endoscopist: Sherilyn Cooter L. Myrtie Neither , MD, [id number] Age: 47 Referring MD:  Date of Birth: [birth year] Gender: Female Account #: [id number] Procedure:                Colonoscopy Indications:              Screening for colorectal malignant neoplasm, This                            is the patient's first colonoscopy                           Descending/sigmoid resection for rectosigmiod                            perforation July 2024. Prior abdominal/pelvic                            surgeries Medicines:                Monitored Anesthesia Care Procedure:                Pre-Anesthesia Assessment:                           - Prior to the procedure, a History and Physical                            was performed, and patient medications and                            allergies were reviewed. The patient's tolerance of                            previous anesthesia was also reviewed. The risks                            and benefits of the procedure and the sedation                            options and risks were discussed with the patient.                            All questions were answered, and informed consent                            was obtained. Prior Anticoagulants: The patient has                            taken no anticoagulant or antiplatelet agents. ASA                            Grade Assessment: II - A patient with mild systemic  disease. After reviewing the risks and benefits,                            the patient was deemed in satisfactory condition to                            undergo the procedure.                           After obtaining informed consent, the colonoscope                            was passed under direct vision. Throughout the                            procedure, the patient's blood pressure, pulse, and                             oxygen saturations were monitored continuously. The                            Olympus Scope SN: [id number] was introduced through                            the descending colostomy and advanced to the the                            terminal ileum, with identification of the                            appendiceal orifice and IC valve. The colonoscopy                            was performed without difficulty. The patient                            tolerated the procedure well. The quality of the                            bowel preparation was good. The terminal ileum,                            ileocecal valve, appendiceal orifice, and rectum                            were photographed. Scope In: 2:10:37 PM Scope Out: 2:27:25 PM Scope Withdrawal Time: 0 hours 15 minutes 15 seconds  Total Procedure Duration: 0 hours 16 minutes 48 seconds  Findings:                 A few diverticula were found in the proximal                            descending colon. The remainder of the visualized  colon from the cecum to the descending colostomy                            was otherwise normal. No polyps were seen. Terminal                            ileum normal.                           After completed that examination, the patient was                            turned to the left lateral decubitus position to                            examine the rectal pouch.                           The lumen in the mid to distal rectum was obscured                            by inspissated mucus which was suctioned and                            removed from the area. The scope was then advanced                            to approximately 25 cm from the anal verge. There                            was some densely adherent mucus, and good overall                            visualization with lavage. At the extent of                            insertion, the colon became tortuous  and felt                            relatively "fixed" in position in the pelvis. There                            appeared to be some lumen proximal to this area                            around the corner, but it could not be reached and                            examined.                           A few small-mouthed diverticula were found in the  distal sigmoid colon. Complications:            No immediate complications. Estimated Blood Loss:     Estimated blood loss: none. Impression:               - Diverticulosis in the proximal descending colon.                           - Diverticulosis in the distal sigmoid colon.                           - No specimens collected. Recommendation:           - Patient has a contact number available for                            emergencies. The signs and symptoms of potential                            delayed complications were discussed with the                            patient. Return to normal activities tomorrow.                            Written discharge instructions were provided to the                            patient.                           - Resume previous diet.                           - Continue present medications.                           -Return to Dr. Marin Olp to planned                            colostomy takedown surgery.                           - Repeat colonoscopy in 5 years for screening                            purposes. (See above regarding visualization of the                            [employer]) Alexi Dorminey L. Myrtie Neither, MD 01/28/2023 2:38:20 PM This report has been signed electronically.

## 2023-01-28 NOTE — Patient Instructions (Signed)
Educational handout provided to patient related to Diverticulosis  Resume previous diet  Continue present medications  Return to Dr. Cliffton Asters to planned colostomy takedown surgery.  Repeat colonoscopy in 5 years for screening purposes.   YOU HAD AN ENDOSCOPIC PROCEDURE TODAY AT THE  ENDOSCOPY CENTER:   Refer to the procedure report that was given to you for any specific questions about what was found during the examination.  If the procedure report does not answer your questions, please call your gastroenterologist to clarify.  If you requested that your care partner not be given the details of your procedure findings, then the procedure report has been included in a sealed envelope for you to review at your convenience later.  YOU SHOULD EXPECT: Some feelings of bloating in the abdomen. Passage of more gas than usual.  Walking can help get rid of the air that was put into your GI tract during the procedure and reduce the bloating. If you had a lower endoscopy (such as a colonoscopy or flexible sigmoidoscopy) you may notice spotting of blood in your stool or on the toilet paper. If you underwent a bowel prep for your procedure, you may not have a normal bowel movement for a few days.  Please Note:  You might notice some irritation and congestion in your nose or some drainage.  This is from the oxygen used during your procedure.  There is no need for concern and it should clear up in a day or so.  SYMPTOMS TO REPORT IMMEDIATELY:  Following lower endoscopy (colonoscopy or flexible sigmoidoscopy):  Excessive amounts of blood in the stool  Significant tenderness or worsening of abdominal pains  Swelling of the abdomen that is new, acute  Fever of 100F or higher  For urgent or emergent issues, a gastroenterologist can be reached at any hour by calling [phone]. Do not use MyChart messaging for urgent concerns.    DIET:  We do recommend a small meal at first, but then you may  proceed to your regular diet.  Drink plenty of fluids but you should avoid alcoholic beverages for 24 hours.  ACTIVITY:  You should plan to take it easy for the rest of today and you should NOT DRIVE or use heavy machinery until tomorrow (because of the sedation medicines used during the test).    FOLLOW UP: Our staff will call the number listed on your records the next business day following your procedure.  We will call around 7:15- 8:00 am to check on you and address any questions or concerns that you may have regarding the information given to you following your procedure. If we do not reach you, we will leave a message.     If any biopsies were taken you will be contacted by phone or by letter within the next 1-3 weeks.  Please call us at [phone] if you have not heard about the biopsies in 3 weeks.    SIGNATURES/CONFIDENTIALITY: You and/or your care partner have signed paperwork which will be entered into your electronic medical record.  These signatures attest to the fact that that the information above on your After Visit Summary has been reviewed and is understood.  Full responsibility of the confidentiality of this discharge information lies with you and/or your care-partner.

## 2023-01-28 NOTE — Progress Notes (Signed)
No significant changes to clinical history since GI office visit on 01/12/23.  The patient is appropriate for an endoscopic procedure in the ambulatory setting.  - Amada Jupiter, MD

## 2023-01-28 NOTE — Progress Notes (Signed)
Pt's states no medical or surgical changes since previsit or office visit. 

## 2023-01-28 NOTE — Progress Notes (Signed)
Sedate, gd SR, tolerated procedure well, VSS, report to RN 

## 2023-01-31 ENCOUNTER — Telehealth: Payer: Self-pay

## 2023-01-31 DIAGNOSIS — Z433 Encounter for attention to colostomy: Secondary | ICD-10-CM | POA: Diagnosis not present

## 2023-01-31 NOTE — Telephone Encounter (Signed)
  Follow up Call-     01/28/2023    1:24 PM  Call back number  Post procedure Call Back phone  # [phone]  Permission to leave phone message Yes     Patient questions:  Do you have a fever, pain , or abdominal swelling? No. Pain Score  0 *  Have you tolerated food without any problems? Yes.    Have you been able to return to your normal activities? Yes.    Do you have any questions about your discharge instructions: Diet   No. Medications  No. Follow up visit  No.  Do you have questions or concerns about your Care? No.  Actions: * If pain score is 4 or above: No action needed, pain <4.

## 2023-02-09 DIAGNOSIS — L7211 Pilar cyst: Secondary | ICD-10-CM | POA: Diagnosis not present

## 2023-02-09 DIAGNOSIS — L720 Epidermal cyst: Secondary | ICD-10-CM | POA: Diagnosis not present

## 2023-02-09 DIAGNOSIS — R220 Localized swelling, mass and lump, head: Secondary | ICD-10-CM | POA: Diagnosis not present

## 2023-02-24 DIAGNOSIS — R220 Localized swelling, mass and lump, head: Secondary | ICD-10-CM | POA: Diagnosis not present

## 2023-04-22 DIAGNOSIS — Z0000 Encounter for general adult medical examination without abnormal findings: Secondary | ICD-10-CM | POA: Diagnosis not present

## 2023-04-22 DIAGNOSIS — Z9049 Acquired absence of other specified parts of digestive tract: Secondary | ICD-10-CM | POA: Diagnosis not present

## 2023-04-22 DIAGNOSIS — Z933 Colostomy status: Secondary | ICD-10-CM | POA: Diagnosis not present

## 2023-04-22 DIAGNOSIS — N9489 Other specified conditions associated with female genital organs and menstrual cycle: Secondary | ICD-10-CM | POA: Diagnosis not present

## 2023-04-22 DIAGNOSIS — Z23 Encounter for immunization: Secondary | ICD-10-CM | POA: Diagnosis not present

## 2023-04-29 ENCOUNTER — Encounter: Payer: Self-pay | Admitting: Obstetrics

## 2023-07-28 ENCOUNTER — Telehealth: Payer: Self-pay | Admitting: Gastroenterology

## 2023-07-28 NOTE — Telephone Encounter (Signed)
 Inbound call from patient, would like to speak to a manager in regards to receiving bill for a procedure she had done in 12/2022. Patient states she got in contact with her insurance and the reason it was not covered was because it was not "pre- Authorized" patient is extremely upset and is wishing for procedure to be re-billed correctly with the prior authorization done. She states her insurance covers it being that it was her first one.

## 2023-08-02 DIAGNOSIS — Z9889 Other specified postprocedural states: Secondary | ICD-10-CM | POA: Diagnosis not present

## 2023-08-02 DIAGNOSIS — Z8719 Personal history of other diseases of the digestive system: Secondary | ICD-10-CM | POA: Diagnosis not present

## 2023-08-02 DIAGNOSIS — Z933 Colostomy status: Secondary | ICD-10-CM | POA: Diagnosis not present

## 2023-08-19 DIAGNOSIS — Z433 Encounter for attention to colostomy: Secondary | ICD-10-CM | POA: Diagnosis not present

## 2023-08-24 DIAGNOSIS — H6691 Otitis media, unspecified, right ear: Secondary | ICD-10-CM | POA: Diagnosis not present

## 2023-08-24 DIAGNOSIS — K121 Other forms of stomatitis: Secondary | ICD-10-CM | POA: Diagnosis not present

## 2023-09-30 DIAGNOSIS — H9201 Otalgia, right ear: Secondary | ICD-10-CM | POA: Diagnosis not present

## 2023-09-30 DIAGNOSIS — R6884 Jaw pain: Secondary | ICD-10-CM | POA: Diagnosis not present

## 2023-09-30 DIAGNOSIS — L989 Disorder of the skin and subcutaneous tissue, unspecified: Secondary | ICD-10-CM | POA: Diagnosis not present

## 2023-10-20 DIAGNOSIS — Z433 Encounter for attention to colostomy: Secondary | ICD-10-CM | POA: Diagnosis not present

## 2023-11-16 DIAGNOSIS — D485 Neoplasm of uncertain behavior of skin: Secondary | ICD-10-CM | POA: Diagnosis not present

## 2023-11-16 DIAGNOSIS — L818 Other specified disorders of pigmentation: Secondary | ICD-10-CM | POA: Diagnosis not present

## 2023-11-18 DIAGNOSIS — H9201 Otalgia, right ear: Secondary | ICD-10-CM | POA: Diagnosis not present

## 2023-11-18 DIAGNOSIS — H9313 Tinnitus, bilateral: Secondary | ICD-10-CM | POA: Diagnosis not present

## 2024-02-06 DIAGNOSIS — Z4889 Encounter for other specified surgical aftercare: Secondary | ICD-10-CM | POA: Diagnosis not present

## 2024-02-06 DIAGNOSIS — Z8719 Personal history of other diseases of the digestive system: Secondary | ICD-10-CM | POA: Diagnosis not present

## 2024-02-06 DIAGNOSIS — Z933 Colostomy status: Secondary | ICD-10-CM | POA: Diagnosis not present
